# Patient Record
Sex: Male | Born: 1950
Health system: Southern US, Community
[De-identification: ages and names within clinical notes are randomized; demographics above are authoritative.]

## PROBLEM LIST (undated history)

## (undated) DIAGNOSIS — C9110 Chronic lymphocytic leukemia of B-cell type not having achieved remission: Secondary | ICD-10-CM

## (undated) DIAGNOSIS — E7800 Pure hypercholesterolemia, unspecified: Secondary | ICD-10-CM

## (undated) DIAGNOSIS — R251 Tremor, unspecified: Secondary | ICD-10-CM

## (undated) DIAGNOSIS — K635 Polyp of colon: Secondary | ICD-10-CM

## (undated) DIAGNOSIS — K219 Gastro-esophageal reflux disease without esophagitis: Secondary | ICD-10-CM

## (undated) DIAGNOSIS — Z8619 Personal history of other infectious and parasitic diseases: Secondary | ICD-10-CM

## (undated) DIAGNOSIS — R030 Elevated blood-pressure reading, without diagnosis of hypertension: Secondary | ICD-10-CM

## (undated) DIAGNOSIS — Z0000 Encounter for general adult medical examination without abnormal findings: Secondary | ICD-10-CM

## (undated) HISTORY — DX: Gastro-esophageal reflux disease without esophagitis: K21.9

## (undated) HISTORY — DX: Polyp of colon: K63.5

## (undated) HISTORY — DX: Chronic lymphocytic leukemia of B-cell type not having achieved remission: C91.10

## (undated) HISTORY — PX: COLONOSCOPY: SHX174

## (undated) HISTORY — DX: Pure hypercholesterolemia, unspecified: E78.00

## (undated) HISTORY — DX: Personal history of other infectious and parasitic diseases: Z86.19

## (undated) HISTORY — DX: Tremor, unspecified: R25.1

## (undated) HISTORY — PX: POLYPECTOMY: SHX149

## (undated) HISTORY — DX: Encounter for general adult medical examination without abnormal findings: Z00.00

## (undated) HISTORY — DX: Elevated blood-pressure reading, without diagnosis of hypertension: R03.0

---
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
## 2002-09-07 ENCOUNTER — Encounter: Payer: Self-pay | Admitting: Gastroenterology

## 2002-09-07 ENCOUNTER — Ambulatory Visit (HOSPITAL_COMMUNITY): Admission: RE | Admit: 2002-09-07 | Discharge: 2002-09-07 | Payer: Self-pay | Admitting: Gastroenterology

## 2003-03-25 HISTORY — PX: ESOPHAGEAL DILATION: SHX303

## 2003-05-10 ENCOUNTER — Ambulatory Visit (HOSPITAL_COMMUNITY): Admission: RE | Admit: 2003-05-10 | Discharge: 2003-05-10 | Payer: Self-pay | Admitting: Gastroenterology

## 2003-05-10 ENCOUNTER — Encounter (INDEPENDENT_AMBULATORY_CARE_PROVIDER_SITE_OTHER): Payer: Self-pay | Admitting: Specialist

## 2007-03-25 DIAGNOSIS — C9110 Chronic lymphocytic leukemia of B-cell type not having achieved remission: Secondary | ICD-10-CM

## 2007-03-25 HISTORY — DX: Chronic lymphocytic leukemia of B-cell type not having achieved remission: C91.10

## 2008-05-01 ENCOUNTER — Ambulatory Visit: Payer: Self-pay | Admitting: Hematology & Oncology

## 2008-05-05 ENCOUNTER — Encounter: Payer: Self-pay | Admitting: Hematology & Oncology

## 2008-05-05 ENCOUNTER — Other Ambulatory Visit: Admission: RE | Admit: 2008-05-05 | Discharge: 2008-05-05 | Payer: Self-pay | Admitting: Hematology & Oncology

## 2008-05-24 ENCOUNTER — Encounter (INDEPENDENT_AMBULATORY_CARE_PROVIDER_SITE_OTHER): Payer: Self-pay | Admitting: Gastroenterology

## 2008-05-24 ENCOUNTER — Ambulatory Visit (HOSPITAL_COMMUNITY): Admission: RE | Admit: 2008-05-24 | Discharge: 2008-05-24 | Payer: Self-pay | Admitting: Gastroenterology

## 2010-08-06 NOTE — Op Note (Signed)
NAMEQUINTUS, George [name]               ACCOUNT NO.:  [id number]   MEDICAL RECORD NO.:  [id number]          PATIENT TYPE:  AMB   LOCATION:  ENDO                         FACILITY:  [facility]   PHYSICIAN:  Petra Kuba, M.D.    DATE OF BIRTH:  06/21/1950   DATE OF PROCEDURE:  05/24/2008  DATE OF DISCHARGE:                               OPERATIVE REPORT   PROCEDURE:  Colonoscopy with polypectomy.   INDICATION:  History of colon polyp due for repeat screening.  Consent  was signed after risks, benefits, methods, and options were thoroughly  discussed multiple times in the past.   MEDICINES USED:  1. Fentanyl 100 mcg.  2. Versed 10 mg.   PROCEDURE:  Rectal inspection is pertinent for external hemorrhoids,  small.  Digital exam was negative.  The pediatric video colonoscope was  inserted, easily advanced around the colon to the cecum.  This did not  require any abdominal pressure or any position changes.  Other than some  sigmoid diverticula, no abnormalities were seen on insertion.  The cecum  was identified by the appendiceal orifice in the ileocecal valve.  The  scope was slowly withdrawn.  The prep was adequate.  There was some  liquid stool that required washing and suctioning, and slow withdrawal  through the colon.  In the cecal pole, a tiny polyp was seen and was  cold biopsied x3 and put in the first container.  The scope was further  withdrawn.  In the more proximal transverse, a small semi-sessile polyp  was seen, snare electrocautery applied.  The polyp was suctioned through  the scope and collected in the trap.  The scope was further withdrawn.  Other than the sigmoid diverticula, no other polyp or lesions were seen  as we slowly withdrew back into the rectum.  Anorectal pull-through and  retroflexion confirmed some small hemorrhoids.  The scope was drained  and readvanced slowly towards the left side of the colon.  Air was  suctioned and scope removed.  The patient tolerated the  procedure well.  There was no obvious immediate complication.   ENDOSCOPIC DIAGNOSES:  1. Internal and external small hemorrhoids.  2. Sigmoid diverticula.  3. Small transverse proximal polyp snare.  4. Tiny cecal polyp cold biopsy.  5. Otherwise within normal limits cecum.   PLAN:  Await pathology.  Probably recheck colon screening in 5 years.  Happy to see back p.r.n. particularly if upper tract symptoms worsen and  otherwise return care to Dr. Azucena Cecil for the customary healthcare  screening and maintenance.           ______________________________  Petra Kuba, M.D.     MEM/MEDQ  D:  05/24/2008  T:  05/24/2008  Job:  [id number]   cc:   Tally Joe, M.D.

## 2010-08-09 NOTE — Op Note (Signed)
NAMEGIANCARLOS, George [name]                           ACCOUNT NO.:  [id number]   MEDICAL RECORD NO.:  [id number]                   PATIENT TYPE:  AMB   LOCATION:  ENDO                                 FACILITY:  [facility]   PHYSICIAN:  Petra Kuba, M.D.                 DATE OF BIRTH:  March 15, 1951   DATE OF PROCEDURE:  05/10/2003  DATE OF DISCHARGE:                                 OPERATIVE REPORT   PROCEDURE:  Colonoscopy.   INDICATIONS FOR PROCEDURE:  Screening.  Consent was signed after risks,  benefits, methods, and options were thoroughly discussed in the office.   MEDICATIONS USED:  Demerol 100, Versed 8.   PROCEDURE:  Rectal inspection was pertinent for external hemorrhoids.  Digital exam was negative.  The video pediatric adjustable colonoscope was  inserted and easily advanced through the colon to the cecum.  It did require  some abdominal pressure and position changes.  On insertion, some left sided  diverticula was seen but no other abnormalities.  The cecum was identified  by the appendiceal orifice and the ileocecal valve.  The scope was slowly  withdrawn.  The prep was adequate, there was some liquid stool that required  washing and suctioning.  On slow withdrawal through the colon, the cecum,  ascending, and transverse was normal.  The scope was withdrawn around the  left side of the colon, scattered diverticula were seen.  There were was a  larger increase in the sigmoid.  The scope was withdrawn back to the rectum  where two tiny, one proximal and one distal, polyps were seen and were each  cold biopsied x 2, put in the same container.  No other abnormalities were  seen.  Anorectal pull through and retroflexion confirms hemorrhoids.  The  scope was straightened and readvanced a short ways up the left side of the  colon, air was suctioned, the scope was removed.  The patient tolerated the  procedure well.  There was no obvious immediate complications.   ENDOSCOPIC DIAGNOSIS:  1. Internal and external hemorrhoids.  2. Left sided diverticula.  3. Two rectal polyps, cold biopsied.  4. Otherwise, within normal limits to the cecum.   PLAN:  Await pathology.  Probably recheck colon screening in five years.  Happy to see back p.r.n. or in six months to recheck his swallowing.  Otherwise, return care to Dr. Dayton Scrape for the customary health care  maintenance to include yearly rectals and guaiacs.                                               Petra Kuba, M.D.    MEM/MEDQ  D:  05/10/2003  T:  05/10/2003  Job:  [id number]   cc:   Meredith Staggers, M.D.  [phone]  [address], [address]  [place]  [place] [address]  Fax: [phone]

## 2010-08-09 NOTE — Op Note (Signed)
George, [name]                           ACCOUNT NO.:  [id number]   MEDICAL RECORD NO.:  [id number]                   PATIENT TYPE:  AMB   LOCATION:  ENDO                                 FACILITY:  [facility]   PHYSICIAN:  Petra Kuba, M.D.                 DATE OF BIRTH:  [birth year]   DATE OF PROCEDURE:  09/07/2002  DATE OF DISCHARGE:                                 OPERATIVE REPORT   PROCEDURE:  Esophagogastroduodenoscopy with Savary dilatation.   INDICATIONS:  Longstanding dysphagia in a patient with a history of reflux  prior to this.  Consent was signed after risks, benefits, methods, options  were thoroughly discussed in the past.   MEDICATIONS:  Demerol 100, Versed 10.   DESCRIPTION OF PROCEDURE:  The video endoscope was inserted by direct  vision.  The proximal and mid esophagus was normal.  In the distal esophagus  was a tight, calcified ring-like stricture without any obvious mass lesion.  We could not advance the scope with general pressure.  The Savary wire was  advanced into the stomach under endoscopic and fluoroscopic guidance and the  customary J-loop seen fluoroscopically in the stomach.  The scope was  withdrawn.  No obvious proximal esophageal abnormality was seen.  We  withdrew the scope, making sure to keep the wire in the proper position  under fluoroscopy.  We then proceeded with the Savary dilatation in the  customary fashion using the 9, 11 and then 12.8 mm dilators.  There was no  resistance in passing any of the dilators and no heme.  Once the 12.8 was  confirmed in the stomach, the wire was withdrawn back into the dilator.  Both were removed again. We went ahead and reinserted the endoscope.  Again  the proximal and mid esophagus was normal and in the distal esophagus, the  ring was obviously fractured with some fresh heme but no signs of active  bleeding.  Scope passed easily into the stomach, advanced through a normal  antrum and normal pylorus,  into a normal duodenal bulb and around the C-loop  to the a normal second portion of the duodenum.  The scope was withdrawn  back to the stomach and retroflexed.  Hiatal hernia was confirmed in the  cardia.  Fundus, annularis, lesser and greater curve were normal retroflexed  visualization.  Straight visualization of the stomach was normal.  Again the  scope was slowly withdrawn.  We elected, based on the fracturing of the  ring, not to be any further aggressive at this time.  The scope was removed.  The patient tolerated the procedure well.  There was no obvious  complication.   ENDOSCOPIC DIAGNOSES:  1. Small hiatal hernia with a tight fibrous, ring-like stricture.  Unable to     pass the scope.  2. Savary dilatation under fluoroscopy to 12.8 mm without resistance or  heme.  3. We passed the scope with good fracturing of the ring. No active bleeding     at the end of the procedure.  Otherwise negative     esophagogastroduodenoscopy.   PLAN:  Based on how tight the stricture was, will use Nexium to prevent  recurrence.  Will be happy to see back p.r.n. and redilate more  aggressively.  Otherwise follow up in two months to recheck symptoms and  make sure no further workup plans are needed.                                                Petra Kuba, M.D.   MEM/MEDQ  D:  09/07/2002  T:  09/07/2002  Job:  [id number]

## 2010-10-22 ENCOUNTER — Encounter: Payer: Self-pay | Admitting: Podiatry

## 2010-10-22 DIAGNOSIS — K219 Gastro-esophageal reflux disease without esophagitis: Secondary | ICD-10-CM

## 2010-10-22 DIAGNOSIS — E7800 Pure hypercholesterolemia, unspecified: Secondary | ICD-10-CM

## 2010-10-22 DIAGNOSIS — M722 Plantar fascial fibromatosis: Secondary | ICD-10-CM | POA: Insufficient documentation

## 2010-12-25 ENCOUNTER — Other Ambulatory Visit: Payer: Self-pay | Admitting: Hematology & Oncology

## 2010-12-25 ENCOUNTER — Encounter (HOSPITAL_BASED_OUTPATIENT_CLINIC_OR_DEPARTMENT_OTHER): Payer: 59 | Admitting: Hematology & Oncology

## 2010-12-25 DIAGNOSIS — C9110 Chronic lymphocytic leukemia of B-cell type not having achieved remission: Secondary | ICD-10-CM

## 2010-12-25 DIAGNOSIS — D72820 Lymphocytosis (symptomatic): Secondary | ICD-10-CM

## 2010-12-25 LAB — CBC WITH DIFFERENTIAL (CANCER CENTER ONLY)
BASO#: 0.1 10*3/uL (ref 0.0–0.2)
BASO%: 0.3 % (ref 0.0–2.0)
EOS%: 0.5 % (ref 0.0–7.0)
MCH: 31.8 pg (ref 28.0–33.4)
MCHC: 35.2 g/dL (ref 32.0–35.9)
MONO%: 3.2 % (ref 0.0–13.0)
NEUT#: 4.8 10*3/uL (ref 1.5–6.5)
Platelets: 206 10*3/uL (ref 145–400)
RDW: 13 % (ref 11.1–15.7)

## 2010-12-25 LAB — CHCC SATELLITE - SMEAR

## 2010-12-25 LAB — TECHNOLOGIST REVIEW CHCC SATELLITE

## 2010-12-27 LAB — VITAMIN D 25 HYDROXY (VIT D DEFICIENCY, FRACTURES): Vit D, 25-Hydroxy: 54 ng/mL (ref 30–89)

## 2010-12-27 LAB — BETA 2 MICROGLOBULIN, SERUM: Beta-2 Microglobulin: 1.01 mg/L (ref 1.01–1.73)

## 2010-12-27 LAB — IGG, IGA, IGM
IgA: 191 mg/dL (ref 68–379)
IgM, Serum: 132 mg/dL (ref 41–251)

## 2010-12-27 LAB — PROTEIN ELECTROPHORESIS, SERUM
Alpha-1-Globulin: 3.6 % (ref 2.9–4.9)
Beta 2: 4.8 % (ref 3.2–6.5)
Gamma Globulin: 12 % (ref 11.1–18.8)

## 2011-06-11 ENCOUNTER — Ambulatory Visit: Payer: 59 | Admitting: Hematology & Oncology

## 2011-06-11 ENCOUNTER — Other Ambulatory Visit (HOSPITAL_BASED_OUTPATIENT_CLINIC_OR_DEPARTMENT_OTHER): Payer: 59 | Admitting: Lab

## 2011-06-11 ENCOUNTER — Ambulatory Visit (HOSPITAL_BASED_OUTPATIENT_CLINIC_OR_DEPARTMENT_OTHER): Payer: 59 | Admitting: Hematology & Oncology

## 2011-06-11 VITALS — BP 145/94 | HR 62 | Temp 97.7°F | Ht 68.5 in | Wt 164.0 lb

## 2011-06-11 DIAGNOSIS — C9110 Chronic lymphocytic leukemia of B-cell type not having achieved remission: Secondary | ICD-10-CM

## 2011-06-11 DIAGNOSIS — K219 Gastro-esophageal reflux disease without esophagitis: Secondary | ICD-10-CM

## 2011-06-11 LAB — MANUAL DIFFERENTIAL (CHCC SATELLITE)
ANC (CHCC HP manual diff): 5.5 10*3/uL (ref 1.5–6.5)
LYMPH: 80 % — ABNORMAL HIGH (ref 14–48)
PLT EST ~~LOC~~: ADEQUATE
Platelet Morphology: NORMAL

## 2011-06-11 LAB — CBC WITH DIFFERENTIAL (CANCER CENTER ONLY)
HCT: 42.4 % (ref 38.7–49.9)
HGB: 14.5 g/dL (ref 13.0–17.1)
MCH: 31.2 pg (ref 28.0–33.4)
MCHC: 34.2 g/dL (ref 32.0–35.9)
RDW: 12.7 % (ref 11.1–15.7)

## 2011-06-11 LAB — CHCC SATELLITE - SMEAR

## 2011-06-11 MED ORDER — ESOMEPRAZOLE MAGNESIUM 40 MG PO CPDR: 40.0000 mg | DELAYED_RELEASE_CAPSULE | Freq: Every day | ORAL | Status: DC

## 2011-06-11 NOTE — Progress Notes (Signed)
CC:   Marguarite Arbour, MD  DIAGNOSIS:  Stage A chronic lymphocytic leukemia.  CURRENT THERAPY:  Observation.  INTERIM HISTORY:  George [name] comes in for followup.  We have seen him since February 2010.  He has done very well.  We last saw him back in October 2012.  He has had no problems at all since then.  He has had no fevers, chills, or sweats.  He has had no palpable lymph glands.  He has had no change in bowel or bladder habits.  There has been no weight loss or weight gain.  He has not noticed any rashes.  He has noted a couple of small lesions on his face.  He is going to be seen by a dermatologist for this.  He is on Nexium and Crestor.  These are being managed by primary care.  When I saw him in October, his IgG level was 846 mg/dL.  He did not have a monoclonal spike in his serum.  PHYSICAL EXAM:  General: This is a well-developed, well-nourished, white gentleman in no obvious distress.  Vital signs: Show temperature of 97.7, pulse 62, respiratory rate 14, blood pressure 145/94, and weight is 164.  Head and neck exam shows a normocephalic, atraumatic skull. There are no ocular or oral lesions.  He has no adenopathy in his neck. Thyroid is nonpalpable.  Lungs are clear bilaterally.  Cardiac exam: Regular rate and rhythm with a normal S1 and S2.  There are no murmurs, rubs, or bruits.  Abdominal exam: Soft with good bowel sounds.  There is no palpable abdominal mass.  There is no fluid wave.  No palpable hepatosplenomegaly is noted.  Back exam:  No tenderness over the spine, ribs, or hips.  Extremities: Shows no clubbing, cyanosis, or edema. Neurological exam: Shows no focal neurological deficits.  Axillary exam: Shows no bilateral axillary adenopathy.  LABORATORY STUDIES:  White cell count 32.4, hemoglobin is 14.5, hematocrit 43.4, and platelet count 208.  White cell differential shows 17 segs, 80 lymphocytes.  IMPRESSION:  George [name] is a 61 year old gentleman with stage  A chronic lymphocytic leukemia.  We have been following him now for 3 years.  His white count is going up slowly.  He is not anemic or thrombocytopenic.  We will go ahead and plan to get him back in another 4 months for followup.  I do not see need for any additional testing right now.  Again, we can just follow his white cell count.   ______________________________ Josph Macho, M.D. PRE/MEDQ  D:  06/11/2011  T:  06/11/2011  Job:  [id number]

## 2011-06-11 NOTE — Progress Notes (Signed)
This office note has been dictated.

## 2011-06-13 ENCOUNTER — Telehealth: Payer: Self-pay | Admitting: Internal Medicine

## 2011-06-13 ENCOUNTER — Ambulatory Visit (INDEPENDENT_AMBULATORY_CARE_PROVIDER_SITE_OTHER): Payer: 59 | Admitting: Internal Medicine

## 2011-06-13 ENCOUNTER — Encounter: Payer: Self-pay | Admitting: Internal Medicine

## 2011-06-13 VITALS — BP 124/74 | HR 78 | Temp 97.9°F | Resp 18 | Ht 67.0 in | Wt 161.0 lb

## 2011-06-13 DIAGNOSIS — Z79899 Other long term (current) drug therapy: Secondary | ICD-10-CM

## 2011-06-13 DIAGNOSIS — Z125 Encounter for screening for malignant neoplasm of prostate: Secondary | ICD-10-CM

## 2011-06-13 DIAGNOSIS — E7800 Pure hypercholesterolemia, unspecified: Secondary | ICD-10-CM

## 2011-06-13 DIAGNOSIS — K219 Gastro-esophageal reflux disease without esophagitis: Secondary | ICD-10-CM

## 2011-06-13 LAB — PROTEIN ELECTROPHORESIS, SERUM
Albumin ELP: 63.2 % (ref 55.8–66.1)
Alpha-1-Globulin: 4 % (ref 2.9–4.9)
Beta 2: 4.7 % (ref 3.2–6.5)

## 2011-06-13 LAB — LIPID PANEL
LDL Cholesterol: 131 mg/dL — ABNORMAL HIGH (ref 0–99)
Triglycerides: 71 mg/dL (ref <150)
VLDL: 14 mg/dL (ref 0–40)

## 2011-06-13 LAB — IGG, IGA, IGM
IgA: 221 mg/dL (ref 68–379)
IgG (Immunoglobin G), Serum: 910 mg/dL (ref 650–1600)
IgM, Serum: 117 mg/dL (ref 41–251)

## 2011-06-13 LAB — LACTATE DEHYDROGENASE: LDH: 85 U/L — ABNORMAL LOW (ref 94–250)

## 2011-06-13 MED ORDER — ROSUVASTATIN CALCIUM 40 MG PO TABS: 40.0000 mg | ORAL_TABLET | Freq: Every day | ORAL | Status: DC

## 2011-06-13 MED ORDER — ESOMEPRAZOLE MAGNESIUM 40 MG PO CPDR: 40.0000 mg | DELAYED_RELEASE_CAPSULE | Freq: Every day | ORAL | Status: DC

## 2011-06-13 NOTE — Patient Instructions (Signed)
Please schedule lipid/lft 272.4, psa-prostate cancer screening prior to next visit

## 2011-06-13 NOTE — Telephone Encounter (Signed)
Please schedule lipid/lft 272.4, psa-prostate cancer screening prior to next visit  Patient states that he will be going to Dr. Gustavo Lah office to draw labs???

## 2011-06-13 NOTE — Progress Notes (Signed)
  Subjective:    Patient ID: George [name], male    DOB: [birth year], 61 y.o.   MRN: [id number]  HPI Pt presents to clinic for follow up of multiple medical problems. Known h/o CLL followed by H/O and asx. Taking crestor 20mg /40mg  every other day. Trying to avoid 40mg  qd. Denies myalgias or abn lft. Exercises daily using bike. No active complaint.   Past Medical History  Diagnosis Date  . High cholesterol   . GERD (gastroesophageal reflux disease)    Past Surgical History  Procedure Date  . Hernia repair     reports that he has quit smoking. He has never used smokeless tobacco. He reports that he drinks alcohol. His drug history not on file. family history is not on file. No Known Allergies   Review of Systems  Constitutional: Negative for fatigue.  Respiratory: Negative for shortness of breath.   Cardiovascular: Negative for chest pain.  Gastrointestinal: Negative for abdominal pain.  All other systems reviewed and are negative.       Objective:   Physical Exam  Physical Exam  Nursing note and vitals reviewed. Constitutional: Appears well-developed and well-nourished. No distress.  HENT:  Head: Normocephalic and atraumatic.  Right Ear: External ear normal.  Left Ear: External ear normal.  Eyes: Conjunctivae are normal. No scleral icterus.  Neck: Neck supple. Carotid bruit is not present.  Cardiovascular: Normal rate, regular rhythm and normal heart sounds.  Exam reveals no gallop and no friction rub.   No murmur heard. Pulmonary/Chest: Effort normal and breath sounds normal. No respiratory distress. He has no wheezes. no rales.  Lymphadenopathy:    He has no cervical adenopathy.  Neurological:Alert.  Skin: Skin is warm and dry. Not diaphoretic.  Psychiatric: Has a normal mood and affect.        Assessment & Plan:

## 2011-06-13 NOTE — Telephone Encounter (Signed)
Labs entered for July 2013.

## 2011-06-14 LAB — HEPATIC FUNCTION PANEL
ALT: 20 U/L (ref 0–53)
Albumin: 4.6 g/dL (ref 3.5–5.2)
Indirect Bilirubin: 0.4 mg/dL (ref 0.0–0.9)
Total Protein: 7.1 g/dL (ref 6.0–8.3)

## 2011-06-14 LAB — BASIC METABOLIC PANEL
BUN: 13 mg/dL (ref 6–23)
CO2: 24 mEq/L (ref 19–32)
Calcium: 9.9 mg/dL (ref 8.4–10.5)
Chloride: 102 mEq/L (ref 96–112)
Creat: 0.83 mg/dL (ref 0.50–1.35)
Glucose, Bld: 85 mg/dL (ref 70–99)

## 2011-06-15 NOTE — Assessment & Plan Note (Signed)
Attempt crestor 20mg  po qd. Obtain lft today and lipid prior to next visit.

## 2011-06-15 NOTE — Assessment & Plan Note (Signed)
Stable. rf  nexium

## 2011-06-23 ENCOUNTER — Telehealth: Payer: Self-pay | Admitting: *Deleted

## 2011-06-23 NOTE — Telephone Encounter (Addendum)
Message copied by Mirian Capuchin on Mon Jun 23, 2011  3:57 PM ------      Message from: Arlan Organ R      Created: Mon Jun 16, 2011  7:03 PM       Call and tell him that his lab work looks okay although his cholesterol is high. Make sure his primary doctor get a copy of his cholesterol report thanks.  This message left on pt's home answering machine.

## 2011-07-09 ENCOUNTER — Telehealth: Payer: Self-pay | Admitting: Internal Medicine

## 2011-07-09 NOTE — Telephone Encounter (Signed)
Received medical records from [facility]-Dr. Tally Joe

## 2011-07-16 ENCOUNTER — Encounter: Payer: Self-pay | Admitting: Internal Medicine

## 2011-07-16 ENCOUNTER — Ambulatory Visit (INDEPENDENT_AMBULATORY_CARE_PROVIDER_SITE_OTHER): Payer: 59 | Admitting: Internal Medicine

## 2011-07-16 VITALS — BP 112/80 | HR 73 | Temp 98.3°F | Ht 67.0 in | Wt 159.0 lb

## 2011-07-16 DIAGNOSIS — J069 Acute upper respiratory infection, unspecified: Secondary | ICD-10-CM

## 2011-07-16 MED ORDER — DOXYCYCLINE HYCLATE 100 MG PO TABS: 100.0000 mg | ORAL_TABLET | Freq: Two times a day (BID) | ORAL | Status: AC

## 2011-07-17 DIAGNOSIS — J069 Acute upper respiratory infection, unspecified: Secondary | ICD-10-CM | POA: Insufficient documentation

## 2011-07-17 NOTE — Progress Notes (Signed)
  Subjective:    Patient ID: George [name], male    DOB: [birth year], 61 y.o.   MRN: [id number]  HPI Pt presents to clinic for evaluation of cough. Notes ~4d h/o cough productive for green/yellow sputum without hemoptysis. +nasal drainage and congestion. No f/c. Taking otc sudafed. No other alleviating or exacerbating factors. No other complaints.  Past Medical History  Diagnosis Date  . High cholesterol   . GERD (gastroesophageal reflux disease)   . History of chicken pox     childhood  . Colon polyp   . CLL (chronic lymphocytic leukemia)    Past Surgical History  Procedure Date  . Hernia repair   . Esophageal dilation 2005    reports that he has quit smoking. He has never used smokeless tobacco. He reports that he drinks alcohol. His drug history not on file. family history includes Diabetes in an unspecified family member; Hyperlipidemia in his mother; Hypertension in his mother; and Pulmonary fibrosis in his mother.  There is no history of Prostate cancer, and Breast cancer, and Colon cancer, and Heart disease, . No Known Allergies   Review of Systems see hpi     Objective:   Physical Exam  Nursing note and vitals reviewed. Constitutional: He appears well-developed and well-nourished. No distress.  HENT:  Head: Normocephalic and atraumatic.  Right Ear: External ear normal.  Left Ear: External ear normal.  Nose: Nose normal.  Mouth/Throat: Oropharynx is clear and moist. No oropharyngeal exudate.  Eyes: Conjunctivae are normal. Right eye exhibits no discharge. Left eye exhibits no discharge. No scleral icterus.  Neck: Neck supple.  Pulmonary/Chest: Effort normal and breath sounds normal. No respiratory distress. He has no wheezes. He has no rales.  Lymphadenopathy:    He has no cervical adenopathy.  Neurological: He is alert.  Skin: He is not diaphoretic.  Psychiatric: He has a normal mood and affect.          Assessment & Plan:

## 2011-07-17 NOTE — Assessment & Plan Note (Signed)
Given abx to hold. Begin abx if sx's do not improved after total duration of 8-10 days. Followup if no improvement or worsening.

## 2011-10-01 ENCOUNTER — Ambulatory Visit (HOSPITAL_BASED_OUTPATIENT_CLINIC_OR_DEPARTMENT_OTHER): Payer: 59 | Admitting: Hematology & Oncology

## 2011-10-01 ENCOUNTER — Other Ambulatory Visit (HOSPITAL_BASED_OUTPATIENT_CLINIC_OR_DEPARTMENT_OTHER): Payer: 59 | Admitting: Lab

## 2011-10-01 ENCOUNTER — Other Ambulatory Visit: Payer: Self-pay | Admitting: Internal Medicine

## 2011-10-01 VITALS — BP 133/90 | HR 58 | Temp 97.1°F | Ht 67.0 in | Wt 157.0 lb

## 2011-10-01 DIAGNOSIS — C9110 Chronic lymphocytic leukemia of B-cell type not having achieved remission: Secondary | ICD-10-CM

## 2011-10-01 LAB — CBC WITH DIFFERENTIAL (CANCER CENTER ONLY)
BASO%: 0.2 % (ref 0.0–2.0)
Eosinophils Absolute: 0.1 10*3/uL (ref 0.0–0.5)
HCT: 42.8 % (ref 38.7–49.9)
HGB: 14.6 g/dL (ref 13.0–17.1)
LYMPH#: 27.4 10*3/uL — ABNORMAL HIGH (ref 0.9–3.3)
MCV: 92 fL (ref 82–98)
MONO#: 0.7 10*3/uL (ref 0.1–0.9)
NEUT%: 12 % — ABNORMAL LOW (ref 40.0–80.0)
RBC: 4.68 10*6/uL (ref 4.20–5.70)
RDW: 13.2 % (ref 11.1–15.7)
WBC: 32 10*3/uL — ABNORMAL HIGH (ref 4.0–10.0)

## 2011-10-01 LAB — LIPID PANEL
HDL: 74 mg/dL (ref >39)
LDL Cholesterol: 123 mg/dL — ABNORMAL HIGH (ref 0–99)

## 2011-10-01 LAB — CHCC SATELLITE - SMEAR

## 2011-10-01 NOTE — Progress Notes (Signed)
This office note has been dictated.

## 2011-10-02 LAB — HEPATIC FUNCTION PANEL
AST: 31 U/L (ref 0–37)
AST: 28 U/L (ref 0–37)
Alkaline Phosphatase: 54 U/L (ref 39–117)
Bilirubin, Direct: 0.2 mg/dL (ref 0.0–0.3)
Indirect Bilirubin: 0.6 mg/dL (ref 0.0–0.9)
Indirect Bilirubin: 0.6 mg/dL (ref 0.0–0.9)
Total Bilirubin: 0.8 mg/dL (ref 0.3–1.2)
Total Bilirubin: 0.8 mg/dL (ref 0.3–1.2)

## 2011-10-02 NOTE — Progress Notes (Signed)
CURRENT THERAPY:  Observation.  INTERIM HISTORY:  George [name] comes in for followup.  We see him every 4- 6 months.  Since we last saw him in March, he has had no problems.  He has had no fevers, sweats or chills.  He has had no palpable lymph nodes.  He has had no fatigue or weakness.  He is still working without any difficulties.  He has not noticed any weight gain or weight loss. He has had no cough or shortness breath.  There has been no rashes.  He has had no headache.  PHYSICAL EXAMINATION:  General: This is a well-developed, well-nourished white gentleman in no obvious distress.  Vital signs:  Temperature of 97.1, pulse 58, respiratory rate 20, blood pressure 133/90.  Weight is 157.  Head and neck:  Normocephalic, atraumatic skull.  There are no ocular or oral lesions.  There are no palpable cervical or supraclavicular lymph nodes.  Lungs:  Clear bilaterally.  Cardiac: Regular rate and rhythm with a normal S1 and S2.  There are no murmurs, rubs, or bruits.  Axillary exam shows no bilateral axillary adenopathy. Abdomen:  Soft with good bowel sounds.  There is no palpable abdominal mass.  There is no fluid wave.  There is no palpable hepatosplenomegaly. Back: No tenderness over the spine, ribs, or hips.  Extremities:  Shows no clubbing, cyanosis or edema.  Neurological:  Shows no focal neurological deficits.  Skin:  No rashes, ecchymosis or petechia.  LABORATORY STUDIES:  White cell count is 232, hemoglobin 14.6, hematocrit 42.8, platelet count 199.  White cell differential shows 12% segs, 85% lymphocytes.  IMPRESSION:  George [name] is a 61 year old gentleman with stage A chronic lymphocytic leukemia.  We have been seeing him now for 3-1/2 half years. He has done very nicely.  So far, there has been no evidence that his CLL is progressing.  I do not see any indication for therapy.  We will plan to get him back in another 4 months or so.  I do not see that we need any blood work  in between visits.    ______________________________ George [name], M.D. PRE/MEDQ  D:  10/01/2011  T:  10/02/2011  Job:  [id number]

## 2011-10-03 LAB — PSA: PSA: 0.53 ng/mL (ref <=4.00)

## 2011-10-14 ENCOUNTER — Ambulatory Visit (INDEPENDENT_AMBULATORY_CARE_PROVIDER_SITE_OTHER): Payer: 59 | Admitting: Internal Medicine

## 2011-10-14 ENCOUNTER — Encounter: Payer: Self-pay | Admitting: Internal Medicine

## 2011-10-14 VITALS — BP 116/70 | HR 54 | Temp 98.1°F | Resp 14 | Wt 158.8 lb

## 2011-10-14 DIAGNOSIS — C9110 Chronic lymphocytic leukemia of B-cell type not having achieved remission: Secondary | ICD-10-CM | POA: Insufficient documentation

## 2011-10-14 DIAGNOSIS — E7800 Pure hypercholesterolemia, unspecified: Secondary | ICD-10-CM

## 2011-10-14 NOTE — Assessment & Plan Note (Signed)
Improving control. Continue statin tx. Low fat diet and exercise recommended. Obtain lipid/lft prior to next visit

## 2011-10-14 NOTE — Patient Instructions (Signed)
Please schedule fasting labs prior to next visit Lipid/lft-272.4 and chem7-v58.69 

## 2011-10-14 NOTE — Progress Notes (Signed)
  Subjective:    Patient ID: George [name], male    DOB: [birth year], 61 y.o.   MRN: [id number]  HPI Pt presents to clinic for followup of multiple medical problems. Tolerating statin tx. Reviewed tcho and ldl improving. H/o CLL with stable WBC followed by H/O with utd follow up. No active complaints.  Past Medical History  Diagnosis Date  . High cholesterol   . GERD (gastroesophageal reflux disease)   . History of chicken pox     childhood  . Colon polyp   . CLL (chronic lymphocytic leukemia)    Past Surgical History  Procedure Date  . Hernia repair   . Esophageal dilation 2005    reports that he has quit smoking. He has never used smokeless tobacco. He reports that he drinks alcohol. His drug history not on file. family history includes Diabetes in an unspecified family member; Hyperlipidemia in his mother; Hypertension in his mother; and Pulmonary fibrosis in his mother.  There is no history of Prostate cancer, and Breast cancer, and Colon cancer, and Heart disease, . No Known Allergies    Review of Systems see hpi     Objective:   Physical Exam  Physical Exam  Nursing note and vitals reviewed. Constitutional: Appears well-developed and well-nourished. No distress.  HENT:  Head: Normocephalic and atraumatic.  Right Ear: External ear normal.  Left Ear: External ear normal.  Eyes: Conjunctivae are normal. No scleral icterus.  Neck: Neck supple. Carotid bruit is not present.  Cardiovascular: Normal rate, regular rhythm and normal heart sounds.  Exam reveals no gallop and no friction rub.   No murmur heard. Pulmonary/Chest: Effort normal and breath sounds normal. No respiratory distress. He has no wheezes. no rales.  Lymphadenopathy:    He has no cervical adenopathy.  Neurological:Alert.  Skin: Skin is warm and dry. Not diaphoretic.  Psychiatric: Has a normal mood and affect.        Assessment & Plan:

## 2012-02-02 ENCOUNTER — Other Ambulatory Visit: Payer: 59 | Admitting: Lab

## 2012-02-02 ENCOUNTER — Ambulatory Visit: Payer: 59 | Admitting: Hematology & Oncology

## 2012-02-04 ENCOUNTER — Ambulatory Visit (HOSPITAL_BASED_OUTPATIENT_CLINIC_OR_DEPARTMENT_OTHER): Payer: 59 | Admitting: Hematology & Oncology

## 2012-02-04 ENCOUNTER — Other Ambulatory Visit (HOSPITAL_BASED_OUTPATIENT_CLINIC_OR_DEPARTMENT_OTHER): Payer: 59 | Admitting: Lab

## 2012-02-04 VITALS — BP 156/90 | HR 63 | Temp 98.2°F | Resp 16 | Ht 67.0 in | Wt 162.0 lb

## 2012-02-04 DIAGNOSIS — C9110 Chronic lymphocytic leukemia of B-cell type not having achieved remission: Secondary | ICD-10-CM

## 2012-02-04 LAB — CBC WITH DIFFERENTIAL (CANCER CENTER ONLY)
HCT: 42.6 % (ref 38.7–49.9)
HGB: 14.5 g/dL (ref 13.0–17.1)
MCV: 91 fL (ref 82–98)
WBC: 40.8 10*3/uL — ABNORMAL HIGH (ref 4.0–10.0)

## 2012-02-04 LAB — MANUAL DIFFERENTIAL (CHCC SATELLITE)
ALC: 31.8 10*3/uL — ABNORMAL HIGH (ref 0.9–3.3)
ANC (CHCC HP manual diff): 7.3 10*3/uL — ABNORMAL HIGH (ref 1.5–6.5)
Eos: 1 % (ref 0–7)
LYMPH: 78 % — ABNORMAL HIGH (ref 14–48)
MONO: 3 % (ref 0–13)

## 2012-02-04 NOTE — Progress Notes (Signed)
This office note has been dictated.

## 2012-02-05 NOTE — Progress Notes (Signed)
CC:   Marguarite Arbour, MD  DIAGNOSIS:  Chronic lymphocytic leukemia.  Current therapy Observation.  INTERIM HISTORY:  George [name] comes in for his followup.  He had a good time this summer.  He and his [place] went out west.  They did a lot of hiking.  He is still staying very active.  He has not noticed any problems with fatigue.  There have been no sweats or fevers.  He has had no cough.  He has had no change in bowel or bladder habits.  He has not noticed any swollen lymph glands.  PHYSICAL EXAMINATION:  General:  This is a well-developed, well- nourished white gentleman in no obvious distress.  Vital signs: Temperature of 98.2, pulse 63, respiratory rate 16, blood pressure 156/90.  Weight is 162.  Head and neck:  Normocephalic, atraumatic skull.  There are no ocular or oral lesions.  There are no palpable cervical or supraclavicular lymph nodes.  Lungs:  Clear bilaterally. Cardiac:  Regular rate and rhythm with a normal S1 and S2.  There are no murmurs, rubs, or bruits.  Abdomen:  Soft with good bowel sounds.  There is no palpable abdominal mass.  There is no palpable hepatosplenomegaly. Axillary:  A 1-cm right axillary lymph node.  There is no left axillary adenopathy.  Inguinal:  No inguinal adenopathy bilaterally. Extremities:  No clubbing, cyanosis, or edema.  Skin:  No rashes, ecchymosis or petechia.  Neurologic:  No focal neurological deficits.  LABORATORY STUDIES:  White cell count is 40.8, hemoglobin 14.5, hematocrit 42.6, platelet count 190.  White cell differential shows 18 segs, 78 lymphs.  IMPRESSION:  George [name] is a 61 year old gentleman with chronic lymphocytic leukemia.  He has stage A chronic lymphocytic leukemia.  We have been following him now for, I think, close to 4 years.  I want to keep track on the white cell count.  I want to see him back in 4 more months.  Hopefully, we will not see a continued rise in his white  cells.    ______________________________ Josph Macho, M.D. PRE/MEDQ  D:  02/04/2012  T:  02/05/2012  Job:  [id number]

## 2012-04-12 ENCOUNTER — Ambulatory Visit (INDEPENDENT_AMBULATORY_CARE_PROVIDER_SITE_OTHER): Payer: 59 | Admitting: Family Medicine

## 2012-04-12 ENCOUNTER — Ambulatory Visit: Payer: 59 | Admitting: Internal Medicine

## 2012-04-12 ENCOUNTER — Encounter: Payer: Self-pay | Admitting: Family Medicine

## 2012-04-12 ENCOUNTER — Telehealth: Payer: Self-pay | Admitting: Internal Medicine

## 2012-04-12 VITALS — BP 138/92 | HR 74 | Temp 97.9°F | Ht 67.0 in | Wt 164.0 lb

## 2012-04-12 DIAGNOSIS — K219 Gastro-esophageal reflux disease without esophagitis: Secondary | ICD-10-CM

## 2012-04-12 DIAGNOSIS — E7800 Pure hypercholesterolemia, unspecified: Secondary | ICD-10-CM

## 2012-04-12 DIAGNOSIS — C9110 Chronic lymphocytic leukemia of B-cell type not having achieved remission: Secondary | ICD-10-CM

## 2012-04-12 MED ORDER — SIMVASTATIN 40 MG PO TABS: 40.0000 mg | ORAL_TABLET | Freq: Every evening | ORAL | Status: DC

## 2012-04-12 NOTE — Patient Instructions (Signed)
  Consider switching to MegaRed krill oil caps daily by Schiff, avoid trans fats  Cholesterol Cholesterol is a white, waxy, fat-like protein needed by your body in small amounts. The liver makes all the cholesterol you need. It is carried from the liver by the blood through the blood vessels. Deposits (plaque) may build up on blood vessel walls. This makes the arteries narrower and stiffer. Plaque increases the risk for heart attack and stroke. You cannot feel your cholesterol level even if it is very high. The only way to know is by a blood test to check your lipid (fats) levels. Once you know your cholesterol levels, you should keep a record of the test results. Work with your caregiver to to keep your levels in the desired range. WHAT THE RESULTS MEAN:  Total cholesterol is a rough measure of all the cholesterol in your blood.  LDL is the so-called bad cholesterol. This is the type that deposits cholesterol in the walls of the arteries. You want this level to be low.  HDL is the good cholesterol because it cleans the arteries and carries the LDL away. You want this level to be high.  Triglycerides are fat that the body can either burn for energy or store. High levels are closely linked to heart disease. DESIRED LEVELS:  Total cholesterol below 200.  LDL below 100 for people at risk, below 70 for very high risk.  HDL above 50 is good, above 60 is best.  Triglycerides below 150. HOW TO LOWER YOUR CHOLESTEROL:  Diet.  Choose fish or white meat chicken and [place], roasted or baked. Limit fatty cuts of red meat, fried foods, and processed meats, such as sausage and lunch meat.  Eat lots of fresh fruits and vegetables. Choose whole grains, beans, pasta, potatoes and cereals.  Use only small amounts of olive, corn or canola oils. Avoid butter, mayonnaise, shortening or palm kernel oils. Avoid foods with trans-fats.  Use skim/nonfat milk and low-fat/nonfat yogurt and cheeses. Avoid whole  milk, cream, ice cream, egg yolks and cheeses. Healthy desserts include angel food cake, ginger snaps, animal crackers, hard candy, popsicles, and low-fat/nonfat frozen yogurt. Avoid pastries, cakes, pies and cookies.  Exercise.  A regular program helps decrease LDL and raises HDL.  Helps with weight control.  Do things that increase your activity level like gardening, walking, or taking the stairs.  Medication.  May be prescribed by your caregiver to help lowering cholesterol and the risk for heart disease.  You may need medicine even if your levels are normal if you have several risk factors. HOME CARE INSTRUCTIONS   Follow your diet and exercise programs as suggested by your caregiver.  Take medications as directed.  Have blood work done when your caregiver feels it is necessary. MAKE SURE YOU:   Understand these instructions.  Will watch your condition.  Will get help right away if you are not doing well or get worse. Document Released: 12/03/2000 Document Revised: 06/02/2011 Document Reviewed: 05/26/2007 [facility] Patient Information 2013 [place], [place].

## 2012-04-12 NOTE — Telephone Encounter (Signed)
RX sent and detailed message on patients voicemail

## 2012-04-12 NOTE — Telephone Encounter (Signed)
Ok Simvastatin is a good alternative but not quite as strong so he should take 40 mg every day instead of every other day. Disp #30 and 5 rf and call if any troubles

## 2012-04-12 NOTE — Telephone Encounter (Signed)
Please advise 

## 2012-04-12 NOTE — Telephone Encounter (Signed)
Pharmacy comments:   Patient is requesting a cheaper alternative to crestor.

## 2012-04-13 ENCOUNTER — Telehealth: Payer: Self-pay

## 2012-04-13 NOTE — Telephone Encounter (Signed)
[place] co pay used to be $0.00 for Nexium but is now $25.00 and Pantoprazole 40 mg is $0.00  Ok to change per MD

## 2012-04-15 ENCOUNTER — Telehealth: Payer: Self-pay

## 2012-04-15 NOTE — Telephone Encounter (Signed)
Pt left a message stating that since he was switching to Zocor should he have blood work done in 6 weeks to make sure this is working? Please advise?  I did leave a message on patients voicemail stating that md is out of the office until Monday morning.

## 2012-04-16 NOTE — Telephone Encounter (Signed)
The numbers just do not change that quickly. Should recheck cholesterol and hepatic in roughly 12 weeks.

## 2012-04-16 NOTE — Telephone Encounter (Signed)
Message left on cell per DPR with information below.  Asked pt to call OR office at [phone] and ask for me if he has any additional questions.

## 2012-04-18 NOTE — Progress Notes (Signed)
Patient ID: George [name], male   DOB: 02-09-1951, 62 y.o.   MRN: [id number] George [name] [id number] 1950-10-29 04/18/2012      Progress Note-Follow Up  Subjective  Chief Complaint  Chief Complaint  Patient presents with  . Follow-up    6 month    HPI  Patient is a 62 year old Caucasian male who is in today for six-month followup. He is doing well. He has been following a heart healthy diet. Denies recent illness, fevers, chills, chest pain, palpitations, shortness of breath, GI or GU complaints. Tolerating Crestor well.  Past Medical History  Diagnosis Date  . High cholesterol   . GERD (gastroesophageal reflux disease)   . History of chicken pox     childhood  . Colon polyp   . CLL (chronic lymphocytic leukemia)     Past Surgical History  Procedure Date  . Hernia repair   . Esophageal dilation 2005    Family History  Problem Relation Age of Onset  . Hyperlipidemia Mother   . Hypertension Mother     mother-father  . Prostate cancer Neg Hx   . Breast cancer Neg Hx   . Colon cancer Neg Hx   . Diabetes      mother -father  . Heart disease Neg Hx   . Pulmonary fibrosis Mother     History   Social History  . Marital Status: Single    Spouse Name: N/A    Number of Children: N/A  . Years of Education: N/A   Occupational History  . Not on file.   Social History Main Topics  . Smoking status: Former Games developer  . Smokeless tobacco: Never Used     Comment: quit 22 years ago 1/2 ppd 10- years  . Alcohol Use: Yes     Comment: social  . Drug Use: Not on file  . Sexually Active: Not on file   Other Topics Concern  . Not on file   Social History Narrative  . No narrative on file    Current Outpatient Prescriptions on File Prior to Visit  Medication Sig Dispense Refill  . aspirin 81 MG EC tablet Take 81 mg by mouth daily.        . cholecalciferol (VITAMIN D) 1000 UNITS tablet Take 1,000 Units by mouth daily. 2 tablets daily      . Coenzyme Q10 (CO Q 10  PO) Take by mouth every morning.       Marland Kitchen GLUCOSAMINE HCL PO Take by mouth every morning.       Chilton Si Tea, Camillia sinensis, (GREEN TEA PO) Take by mouth every morning.       . Multiple Vitamin (MULTI-VITAMIN PO) Take by mouth every morning.       Marland Kitchen NIACIN CR PO Take 500 mg by mouth daily.       . Omega-3 Fatty Acids (FISH OIL CONCENTRATE PO) Take by mouth every morning.       . pantoprazole (PROTONIX) 40 MG tablet Take 40 mg by mouth daily.      . simvastatin (ZOCOR) 40 MG tablet Take 1 tablet (40 mg total) by mouth every evening.  30 tablet  5    No Known Allergies  Review of Systems  Review of Systems  Constitutional: Negative for fever and malaise/fatigue.  HENT: Negative for congestion.   Eyes: Negative for discharge.  Respiratory: Negative for shortness of breath.   Cardiovascular: Negative for chest pain, palpitations and leg swelling.  Gastrointestinal: Negative for nausea,  abdominal pain and diarrhea.  Genitourinary: Negative for dysuria.  Musculoskeletal: Negative for falls.  Skin: Negative for rash.  Neurological: Negative for loss of consciousness and headaches.  Endo/Heme/Allergies: Negative for polydipsia.  Psychiatric/Behavioral: Negative for depression and suicidal ideas. The patient is not nervous/anxious and does not have insomnia.     Objective  BP 138/92  Pulse 74  Temp 97.9 F (36.6 C) (Temporal)  Ht 5\' 7"  (1.702 m)  Wt 164 lb 0.6 oz (74.408 kg)  BMI 25.69 kg/m2  SpO2 96%  Physical Exam  Physical Exam  Constitutional: He is oriented to person, place, and time and well-developed, well-nourished, and in no distress. No distress.  HENT:  Head: Normocephalic and atraumatic.  Eyes: Conjunctivae normal are normal.  Neck: Neck supple. No thyromegaly present.  Cardiovascular: Normal rate, regular rhythm and normal heart sounds.   No murmur heard. Pulmonary/Chest: Effort normal and breath sounds normal. No respiratory distress.  Abdominal: He exhibits  no distension and no mass. There is no tenderness.  Musculoskeletal: He exhibits no edema.  Neurological: He is alert and oriented to person, place, and time.  Skin: Skin is warm.  Psychiatric: Memory, affect and judgment normal.    No results found for this basename: TSH   Lab Results  Component Value Date   WBC 40.8* 02/04/2012   HGB 14.5 02/04/2012   HCT 42.6 02/04/2012   MCV 91 02/04/2012   PLT 190 02/04/2012   Lab Results  Component Value Date   CREATININE 0.83 06/11/2011   BUN 13 06/11/2011   NA 140 06/11/2011   K 4.3 06/11/2011   CL 102 06/11/2011   CO2 24 06/11/2011   Lab Results  Component Value Date   ALT 20 10/01/2011   AST 31 10/01/2011   ALKPHOS 54 10/01/2011   BILITOT 0.8 10/01/2011   Lab Results  Component Value Date   CHOL 207* 10/01/2011   Lab Results  Component Value Date   HDL 74 10/01/2011   Lab Results  Component Value Date   LDLCALC 123* 10/01/2011   Lab Results  Component Value Date   TRIG 48 10/01/2011   Lab Results  Component Value Date   CHOLHDL 2.8 10/01/2011     Assessment & Plan  CLL (chronic lymphocytic leukemia) Follows with hematology no changes in status recently.  GERD (gastroesophageal reflux disease) No recent complaints. Continue Nexium use as indicated.  High cholesterol Tolerated Simvastatin in past and would like to switch back due to cost. Recheck levels at next visit.

## 2012-04-18 NOTE — Assessment & Plan Note (Addendum)
Tolerated Simvastatin in past and would like to switch back due to cost. Recheck levels at next visit.

## 2012-04-18 NOTE — Assessment & Plan Note (Signed)
No recent complaints. Continue Nexium use as indicated.

## 2012-04-18 NOTE — Assessment & Plan Note (Signed)
Follows with hematology no changes in status recently.

## 2012-04-19 ENCOUNTER — Other Ambulatory Visit: Payer: Self-pay

## 2012-04-19 MED ORDER — PANTOPRAZOLE SODIUM 40 MG PO TBEC: 40.0000 mg | DELAYED_RELEASE_TABLET | Freq: Every day | ORAL | Status: DC

## 2012-04-30 ENCOUNTER — Encounter: Payer: Self-pay | Admitting: Internal Medicine

## 2012-06-02 ENCOUNTER — Ambulatory Visit: Payer: 59 | Admitting: Hematology & Oncology

## 2012-06-02 ENCOUNTER — Other Ambulatory Visit: Payer: 59 | Admitting: Lab

## 2012-06-07 ENCOUNTER — Telehealth: Payer: Self-pay

## 2012-06-07 MED ORDER — ESOMEPRAZOLE MAGNESIUM 40 MG PO CPDR: 40.0000 mg | DELAYED_RELEASE_CAPSULE | Freq: Every day | ORAL | Status: DC

## 2012-06-07 NOTE — Telephone Encounter (Signed)
Pt left a message stating he would like to go back on Nexium? Please advise refill?

## 2012-06-07 NOTE — Telephone Encounter (Signed)
Nexium sent to pharmacy and left a vm for patient to return our call to the failure of Protonix.

## 2012-06-07 NOTE — Telephone Encounter (Signed)
No problem, just get him to tell you why so we can document the failure of the Pantoprazole in case insurance tries to argue with him He can have same strength and same sig he was on before, 30 d with 5 rf or 90 day with 1 at patient discretion

## 2012-06-08 ENCOUNTER — Other Ambulatory Visit: Payer: Self-pay | Admitting: Medical

## 2012-06-08 DIAGNOSIS — C9110 Chronic lymphocytic leukemia of B-cell type not having achieved remission: Secondary | ICD-10-CM

## 2012-06-09 ENCOUNTER — Other Ambulatory Visit (HOSPITAL_BASED_OUTPATIENT_CLINIC_OR_DEPARTMENT_OTHER): Payer: 59 | Admitting: Lab

## 2012-06-09 ENCOUNTER — Ambulatory Visit (HOSPITAL_BASED_OUTPATIENT_CLINIC_OR_DEPARTMENT_OTHER): Payer: 59 | Admitting: Medical

## 2012-06-09 VITALS — BP 114/82 | HR 59 | Temp 97.9°F | Resp 18 | Ht 67.0 in | Wt 163.0 lb

## 2012-06-09 DIAGNOSIS — C9110 Chronic lymphocytic leukemia of B-cell type not having achieved remission: Secondary | ICD-10-CM

## 2012-06-09 LAB — CBC WITH DIFFERENTIAL (CANCER CENTER ONLY)
EOS%: 0.4 % (ref 0.0–7.0)
Eosinophils Absolute: 0.2 10*3/uL (ref 0.0–0.5)
LYMPH%: 89.8 % — ABNORMAL HIGH (ref 14.0–48.0)
MCH: 31 pg (ref 28.0–33.4)
MCHC: 33.2 g/dL (ref 32.0–35.9)
MCV: 94 fL (ref 82–98)
MONO%: 3.3 % (ref 0.0–13.0)
Platelets: 237 10*3/uL (ref 145–400)
RBC: 4.64 10*6/uL (ref 4.20–5.70)
RDW: 13.2 % (ref 11.1–15.7)

## 2012-06-09 LAB — CHCC SATELLITE - SMEAR

## 2012-06-09 NOTE — Progress Notes (Signed)
DIAGNOSIS:  Chronic lymphocytic leukemia.  Current therapy Observation.  INTERIM HISTORY: George [name] presents today for an office followup visit.  His wife accompanies him.  Overall, he, reports, that he is doing quite well.  He's not reported any new problems or complaints.  His white count has remained stable since his last visit 4 months ago.  It was 40.8, and today it is 40.2.  He is not symptomatic.  He's not noticed any problems with fatigue.  He does not report any night sweats, or fevers.  He does not report any cough.  He's not noticed any swollen lymph glands.  He has a good appetite.  He denies any unintentional weight loss.  He denies any nausea, vomiting, diarrhea, constipation, chest pain, shortness of breath, fevers, chills.  He denies any abdominal pain.  He denies any lower leg swelling.  He denies any obvious, or abnormal bleeding.  He denies any headaches, visual changes, or rashes.  Review of Systems: Constitutional:Negative for malaise/fatigue, fever, chills, weight loss, diaphoresis, activity change, appetite change, and unexpected weight change.  HEENT: Negative for double vision, blurred vision, visual loss, ear pain, tinnitus, congestion, rhinorrhea, epistaxis sore throat or sinus disease, oral pain/lesion, tongue soreness Respiratory: Negative for cough, chest tightness, shortness of breath, wheezing and stridor.  Cardiovascular: Negative for chest pain, palpitations, leg swelling, orthopnea, PND, DOE or claudication Gastrointestinal: Negative for nausea, vomiting, abdominal pain, diarrhea, constipation, blood in stool, melena, hematochezia, abdominal distention, anal bleeding, rectal pain, anorexia and hematemesis.  Genitourinary: Negative for dysuria, frequency, hematuria,  Musculoskeletal: Negative for myalgias, back pain, joint swelling, arthralgias and gait problem.  Skin: Negative for rash, color change, pallor and wound.  Neurological:. Negative for  dizziness/light-headedness, tremors, seizures, syncope, facial asymmetry, speech difficulty, weakness, numbness, headaches and paresthesias.  Hematological: Negative for adenopathy. Does not bruise/bleed easily.  Psychiatric/Behavioral:  Negative for depression, no loss of interest in normal activity or change in sleep pattern.   Physical Exam: This is a 62 year old, well-developed, well-nourished, white gentleman, in no obvious distress Vitals: Temperature 97.9 degrees, pulse 59, respirations 18, blood pressure 142/82.  Weight 163 pounds HEENT reveals a normocephalic, atraumatic skull, no scleral icterus, no oral lesions  Neck is supple without any cervical or supraclavicular adenopathy.  Lungs are clear to auscultation bilaterally. There are no wheezes, rales or rhonci Cardiac is regular rate and rhythm with a normal S1 and S2. There are no murmurs, rubs, or bruits.  Abdomen is soft with good bowel sounds, there is no palpable mass. There is no palpable hepatosplenomegaly. There is no palpable fluid wave.  Musculoskeletal no tenderness of the spine, ribs, or hips.  Extremities there are no clubbing, cyanosis, or edema.  Skin no petechia, purpura or ecchymosis Neurologic is nonfocal.  Laboratory Data: And white count 40.2, hemoglobin 14.4, hematocrit 43.4, platelets 237,000  Current Outpatient Prescriptions on File Prior to Visit  Medication Sig Dispense Refill  . aspirin 81 MG EC tablet Take 81 mg by mouth daily.        . cholecalciferol (VITAMIN D) 1000 UNITS tablet Take 1,000 Units by mouth daily. 2 tablets daily      . Coenzyme Q10 (CO Q 10 PO) Take by mouth every morning.       Marland Kitchen GLUCOSAMINE HCL PO Take by mouth every morning.       Chilton Si Tea, Camillia sinensis, (GREEN TEA PO) Take by mouth every morning.       . Multiple Vitamin (MULTI-VITAMIN PO) Take by mouth  every morning.       Marland Kitchen NIACIN CR PO Take 500 mg by mouth daily.       . Omega-3 Fatty Acids (FISH OIL CONCENTRATE PO)  Take by mouth every morning.       . simvastatin (ZOCOR) 40 MG tablet Take 1 tablet (40 mg total) by mouth every evening.  30 tablet  5   No current facility-administered medications on file prior to visit.   Assessment/Plan: This is a pleasant, 62 year old, white gentleman, with the following issues:  #1.  Chronic lymphocytic leukemia.  He has stage A., chronic lymphocytic leukemia.  We have been following him for 4 years now.  His white count has not changed since the last time we saw him 4 months ago.  We will still have to continue to keep a close eye on his white cell count.  #2.  Followup.  We will follow back up with George [name] in about 2 months, but before then should there be questions or concerns.

## 2012-07-20 ENCOUNTER — Other Ambulatory Visit: Payer: 59 | Admitting: Lab

## 2012-07-20 ENCOUNTER — Ambulatory Visit: Payer: 59 | Admitting: Hematology & Oncology

## 2012-08-04 ENCOUNTER — Ambulatory Visit (HOSPITAL_BASED_OUTPATIENT_CLINIC_OR_DEPARTMENT_OTHER): Payer: 59 | Admitting: Hematology & Oncology

## 2012-08-04 ENCOUNTER — Other Ambulatory Visit (HOSPITAL_BASED_OUTPATIENT_CLINIC_OR_DEPARTMENT_OTHER): Payer: 59 | Admitting: Lab

## 2012-08-04 VITALS — BP 130/84 | HR 59 | Temp 97.8°F | Resp 18 | Ht 67.0 in | Wt 155.0 lb

## 2012-08-04 DIAGNOSIS — C9110 Chronic lymphocytic leukemia of B-cell type not having achieved remission: Secondary | ICD-10-CM

## 2012-08-04 LAB — CBC WITH DIFFERENTIAL (CANCER CENTER ONLY)
BASO#: 0.2 10*3/uL (ref 0.0–0.2)
BASO%: 0.4 % (ref 0.0–2.0)
EOS%: 0.1 % (ref 0.0–7.0)
HGB: 14 g/dL (ref 13.0–17.1)
MCH: 31.7 pg (ref 28.0–33.4)
MCHC: 33.4 g/dL (ref 32.0–35.9)
MONO%: 3.3 % (ref 0.0–13.0)
NEUT#: 3.1 10*3/uL (ref 1.5–6.5)
NEUT%: 7.7 % — ABNORMAL LOW (ref 40.0–80.0)
RDW: 13.3 % (ref 11.1–15.7)

## 2012-08-04 NOTE — Progress Notes (Signed)
This office note has been dictated.

## 2012-08-05 NOTE — Progress Notes (Signed)
CC:   Marguarite Arbour, MD  DIAGNOSIS:  CLL-stage A.  CURRENT THERAPY:  Observation.  INTERIM HISTORY:  Mr. Plant comes in for his followup.  He is really doing well.  He has really had no problems since we last saw him.  He has had no fevers, sweats, or chills.  There has been no cough or shortness of breath.  He has had no palpable lymph glands.  He has had no change in bowel or bladder habits.  Apparently, he and his wife will be traveling to [place] this summer.  PHYSICAL EXAMINATION:  General:  This is a well-developed, well- nourished white gentleman in no obvious distress.  Vital signs:  Show a temperature of 97.8, pulse 59, respiratory rate 18, blood pressure 130/84.  Weight is 155 pounds.  Head and neck:  Shows a normocephalic, atraumatic skull.  There are no ocular or oral lesions.  There are no palpable cervical or supraclavicular lymph nodes.  LUNGS:  Clear bilaterally. Cardiac:  Regular rate and rhythm with a normal S1 and S2.  There are no murmurs, rubs or bruits.  Abdominal:  Soft with good bowel sounds. There is no palpable abdominal mass.  There is no fluid wave.  No palpable hepatosplenomegaly is noted.  Back:  No tenderness over the spine, ribs, or hips.  Extremities:  Show no clubbing, cyanosis, or edema.  Skin:  No rashes, ecchymosis, or petechia.  LABORATORY STUDIES:  White cell count is 41, hemoglobin 14, hematocrit 41.9, platelet count 206.  White cell differential shows 8% segs, 89 lymphocytes, 10 monos.  IMPRESSION:  Mr. Laday is a nice 62 year old gentleman with chronic lymphocytic leukemia.  We have been following him now for about, I think, 61 or 4 years.  His white cell count really has been holding stable.  He is not anemic or thrombocytopenic.  He has no systemic symptoms.  We will go ahead and get him back in 6 months from now.  I really think that we can do the 6 month followup.  I do not think he needs any blood work in between  visits.    ______________________________ Josph Macho, M.D. PRE/MEDQ  D:  08/04/2012  T:  08/05/2012  Job:  [id number]

## 2012-08-06 LAB — PROTEIN ELECTROPHORESIS, SERUM
Albumin ELP: 64.8 % (ref 55.8–66.1)
Alpha-1-Globulin: 3.6 % (ref 2.9–4.9)
Beta 2: 5 % (ref 3.2–6.5)
Gamma Globulin: 12.5 % (ref 11.1–18.8)

## 2012-08-06 LAB — COMPREHENSIVE METABOLIC PANEL
AST: 29 U/L (ref 0–37)
Albumin: 4.7 g/dL (ref 3.5–5.2)
BUN: 11 mg/dL (ref 6–23)
Calcium: 9.6 mg/dL (ref 8.4–10.5)
Chloride: 100 mEq/L (ref 96–112)
Glucose, Bld: 90 mg/dL (ref 70–99)
Potassium: 3.9 mEq/L (ref 3.5–5.3)
Sodium: 137 mEq/L (ref 135–145)
Total Protein: 6.9 g/dL (ref 6.0–8.3)

## 2012-09-09 ENCOUNTER — Other Ambulatory Visit: Payer: Self-pay

## 2012-09-09 MED ORDER — ESOMEPRAZOLE MAGNESIUM 40 MG PO CPDR: 40.0000 mg | DELAYED_RELEASE_CAPSULE | Freq: Every day | ORAL | Status: DC

## 2012-09-09 NOTE — Telephone Encounter (Signed)
Patient informed that RX was sent.

## 2012-10-18 ENCOUNTER — Encounter: Payer: Self-pay | Admitting: Family Medicine

## 2012-10-18 ENCOUNTER — Ambulatory Visit (INDEPENDENT_AMBULATORY_CARE_PROVIDER_SITE_OTHER): Payer: 59 | Admitting: Family Medicine

## 2012-10-18 VITALS — BP 106/78 | HR 58 | Temp 98.0°F | Resp 16 | Wt 153.1 lb

## 2012-10-18 DIAGNOSIS — E7800 Pure hypercholesterolemia, unspecified: Secondary | ICD-10-CM

## 2012-10-18 DIAGNOSIS — C9110 Chronic lymphocytic leukemia of B-cell type not having achieved remission: Secondary | ICD-10-CM

## 2012-10-18 DIAGNOSIS — K219 Gastro-esophageal reflux disease without esophagitis: Secondary | ICD-10-CM

## 2012-10-18 NOTE — Patient Instructions (Signed)
Digestive Advantage probiotic daily   Gastroesophageal Reflux Disease, Adult Gastroesophageal reflux disease (GERD) happens when acid from your stomach flows up into the esophagus. When acid comes in contact with the esophagus, the acid causes soreness (inflammation) in the esophagus. Over time, GERD may create small holes (ulcers) in the lining of the esophagus. CAUSES   Increased body weight. This puts pressure on the stomach, making acid rise from the stomach into the esophagus.  Smoking. This increases acid production in the stomach.  Drinking alcohol. This causes decreased pressure in the lower esophageal sphincter (valve or ring of muscle between the esophagus and stomach), allowing acid from the stomach into the esophagus.  Late evening meals and a full stomach. This increases pressure and acid production in the stomach.  A malformed lower esophageal sphincter. Sometimes, no cause is found. SYMPTOMS   Burning pain in the lower part of the mid-chest behind the breastbone and in the mid-stomach area. This may occur twice a week or more often.  Trouble swallowing.  Sore throat.  Dry cough.  Asthma-like symptoms including chest tightness, shortness of breath, or wheezing. DIAGNOSIS  Your caregiver may be able to diagnose GERD based on your symptoms. In some cases, X-rays and other tests may be done to check for complications or to check the condition of your stomach and esophagus. TREATMENT  Your caregiver may recommend over-the-counter or prescription medicines to help decrease acid production. Ask your caregiver before starting or adding any new medicines.  HOME CARE INSTRUCTIONS   Change the factors that you can control. Ask your caregiver for guidance concerning weight loss, quitting smoking, and alcohol consumption.  Avoid foods and drinks that make your symptoms worse, such as:  Caffeine or alcoholic drinks.  Chocolate.  Peppermint or mint flavorings.  Garlic and  onions.  Spicy foods.  Citrus fruits, such as oranges, lemons, or limes.  Tomato-based foods such as sauce, chili, salsa, and pizza.  Fried and fatty foods.  Avoid lying down for the 3 hours prior to your bedtime or prior to taking a nap.  Eat small, frequent meals instead of large meals.  Wear loose-fitting clothing. Do not wear anything tight around your waist that causes pressure on your stomach.  Raise the head of your bed 6 to 8 inches with wood blocks to help you sleep. Extra pillows will not help.  Only take over-the-counter or prescription medicines for pain, discomfort, or fever as directed by your caregiver.  Do not take aspirin, ibuprofen, or other nonsteroidal anti-inflammatory drugs (NSAIDs). SEEK IMMEDIATE MEDICAL CARE IF:   You have pain in your arms, neck, jaw, teeth, or back.  Your pain increases or changes in intensity or duration.  You develop nausea, vomiting, or sweating (diaphoresis).  You develop shortness of breath, or you faint.  Your vomit is green, yellow, black, or looks like coffee grounds or blood.  Your stool is red, bloody, or black. These symptoms could be signs of other problems, such as heart disease, gastric bleeding, or esophageal bleeding. MAKE SURE YOU:   Understand these instructions.  Will watch your condition.  Will get help right away if you are not doing well or get worse. Document Released: 12/18/2004 Document Revised: 06/02/2011 Document Reviewed: 09/27/2010 [facility] Patient Information 2014 [place], [place].

## 2012-10-18 NOTE — Assessment & Plan Note (Signed)
Not controlled on Pantoprazole, switched back to Nexium with good results, encouraged to avoid offending foods and start a probiotic

## 2012-10-18 NOTE — Assessment & Plan Note (Addendum)
Had labs drawn on Friday, encouraged krill oil. Taking simvastatin but not working as well as [employer], will switch back if patient is willing, aavoid trans fats

## 2012-10-18 NOTE — Assessment & Plan Note (Signed)
Following with Cancer center, stable doing well

## 2012-10-18 NOTE — Progress Notes (Signed)
Patient ID: George [name], male   DOB: April 28, 1950, 62 y.o.   MRN: [id number] EBENEZER MCCASKEY [id number] 04/07/1950 10/18/2012      Progress Note-Follow Up  Subjective  Chief Complaint  Chief Complaint  Patient presents with  . Hyperlipidemia    Pt here for fasting follow up.  . CLL  . Herpes Zoster    Pt wants to discuss need for shingles vaccine.    HPI  Patient is a 62 year old Caucasian male who is in today for followup. Feeling well. No recent illness. His following with the cancer Center for his CLL in his disease has been stable. No recent fevers or headache. No chest pain or palpitations. No shortness or breath GI or GU concerns noted at this time. He is taking his simvastatin as prescribed without any difficulties  Past Medical History  Diagnosis Date  . High cholesterol   . GERD (gastroesophageal reflux disease)   . History of chicken pox     childhood  . Colon polyp   . CLL (chronic lymphocytic leukemia)     Past Surgical History  Procedure Laterality Date  . Hernia repair    . Esophageal dilation  2005    Family History  Problem Relation Age of Onset  . Hyperlipidemia Mother   . Hypertension Mother     mother-father  . Prostate cancer Neg Hx   . Breast cancer Neg Hx   . Colon cancer Neg Hx   . Diabetes      mother -father  . Heart disease Neg Hx   . Pulmonary fibrosis Mother     History   Social History  . Marital Status: Single    Spouse Name: N/A    Number of Children: N/A  . Years of Education: N/A   Occupational History  . Not on file.   Social History Main Topics  . Smoking status: Former Games developer  . Smokeless tobacco: Never Used     Comment: quit 22 years ago 1/2 ppd 10- years  . Alcohol Use: Yes     Comment: social  . Drug Use: Not on file  . Sexually Active: Not on file   Other Topics Concern  . Not on file   Social History Narrative  . No narrative on file    Current Outpatient Prescriptions on File Prior to Visit   Medication Sig Dispense Refill  . aspirin 81 MG EC tablet Take 81 mg by mouth daily.        . cholecalciferol (VITAMIN D) 1000 UNITS tablet Take 1,000 Units by mouth daily. 2 tablets daily      . Coenzyme Q10 (CO Q 10 PO) Take by mouth every morning.       Marland Kitchen esomeprazole (NEXIUM) 40 MG capsule Take 1 capsule (40 mg total) by mouth daily.  30 capsule  3  . GLUCOSAMINE HCL PO Take by mouth every morning.       Chilton Si Tea, Camillia sinensis, (GREEN TEA PO) Take by mouth every morning.       . Multiple Vitamin (MULTI-VITAMIN PO) Take by mouth every morning.       Marland Kitchen NIACIN CR PO Take 500 mg by mouth daily.       . Omega-3 Fatty Acids (FISH OIL CONCENTRATE PO) Take by mouth every morning.       . simvastatin (ZOCOR) 40 MG tablet Take 1 tablet (40 mg total) by mouth every evening.  30 tablet  5   No current facility-administered  medications on file prior to visit.    No Known Allergies  Review of Systems  Review of Systems  Eyes: Positive for pain.    Objective  BP 106/78  Pulse 58  Temp(Src) 98 F (36.7 C) (Oral)  Resp 16  Wt 153 lb 1.3 oz (69.437 kg)  BMI 23.97 kg/m2  SpO2 98%  Physical Exam  Physical Exam  Constitutional: He is oriented to person, place, and time and well-developed, well-nourished, and in no distress. No distress.  HENT:  Head: Normocephalic and atraumatic.  Eyes: Conjunctivae are normal.  Neck: Neck supple. No thyromegaly present.  Cardiovascular: Normal rate, regular rhythm and normal heart sounds.  Exam reveals no gallop.   No murmur heard. Pulmonary/Chest: Effort normal and breath sounds normal. No respiratory distress.  Abdominal: He exhibits no distension and no mass. There is no tenderness.  Musculoskeletal: He exhibits no edema.  Neurological: He is alert and oriented to person, place, and time.  Skin: Skin is warm.  Psychiatric: Memory, affect and judgment normal.    No results found for this basename: TSH   Lab Results  Component Value  Date   WBC 41.0* 08/04/2012   HGB 14.0 08/04/2012   HCT 41.9 08/04/2012   MCV 95 08/04/2012   PLT 206 08/04/2012   Lab Results  Component Value Date   CREATININE 0.78 08/04/2012   BUN 11 08/04/2012   NA 137 08/04/2012   K 3.9 08/04/2012   CL 100 08/04/2012   CO2 26 08/04/2012   Lab Results  Component Value Date   ALT 21 08/04/2012   AST 29 08/04/2012   ALKPHOS 53 08/04/2012   BILITOT 0.8 08/04/2012   Lab Results  Component Value Date   CHOL 207* 10/01/2011   Lab Results  Component Value Date   HDL 74 10/01/2011   Lab Results  Component Value Date   LDLCALC 123* 10/01/2011   Lab Results  Component Value Date   TRIG 48 10/01/2011   Lab Results  Component Value Date   CHOLHDL 2.8 10/01/2011     Assessment & Plan  GERD (gastroesophageal reflux disease) Not controlled on Pantoprazole, switched back to Nexium with good results, encouraged to avoid offending foods and start a probiotic  High cholesterol Had labs drawn on Friday, encouraged krill oil. 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

## 2012-10-19 ENCOUNTER — Telehealth: Payer: Self-pay

## 2012-10-19 NOTE — Telephone Encounter (Signed)
Per MD pt had labs done at [facility].  I called and talked to a representative at [facility] and she is going to fax the paperwork to MD.

## 2012-10-22 ENCOUNTER — Encounter: Payer: Self-pay | Admitting: Family Medicine

## 2012-10-26 ENCOUNTER — Other Ambulatory Visit: Payer: Self-pay | Admitting: Family Medicine

## 2012-10-26 MED ORDER — ROSUVASTATIN CALCIUM 40 MG PO TABS: 40.0000 mg | ORAL_TABLET | Freq: Every day | ORAL | Status: DC

## 2012-10-26 NOTE — Telephone Encounter (Signed)
Message copied by Court Joy on Tue Oct 26, 2012 11:18 AM ------      Message from: Danise Edge A      Created: Thu Oct 21, 2012 10:04 PM       So let him know his cholesterol has gone back up to 227, recommend he switch back to Crestor 40 mg daily ------

## 2012-10-26 NOTE — Telephone Encounter (Signed)
I left a detailed message on patients vm and sent the new RX in

## 2013-01-05 ENCOUNTER — Other Ambulatory Visit (HOSPITAL_BASED_OUTPATIENT_CLINIC_OR_DEPARTMENT_OTHER): Payer: 59 | Admitting: Lab

## 2013-01-05 ENCOUNTER — Ambulatory Visit (HOSPITAL_BASED_OUTPATIENT_CLINIC_OR_DEPARTMENT_OTHER): Payer: 59 | Admitting: Hematology & Oncology

## 2013-01-05 VITALS — BP 138/84 | HR 60 | Temp 98.1°F | Resp 18 | Ht 67.0 in | Wt 157.0 lb

## 2013-01-05 DIAGNOSIS — C9110 Chronic lymphocytic leukemia of B-cell type not having achieved remission: Secondary | ICD-10-CM

## 2013-01-05 LAB — CBC WITH DIFFERENTIAL (CANCER CENTER ONLY)
BASO#: 0 10*3/uL (ref 0.0–0.2)
Eosinophils Absolute: 0.1 10*3/uL (ref 0.0–0.5)
HGB: 14.2 g/dL (ref 13.0–17.1)
MCH: 31.3 pg (ref 28.0–33.4)
MCHC: 33.1 g/dL (ref 32.0–35.9)
Platelets: 203 10*3/uL (ref 145–400)

## 2013-01-05 LAB — CHCC SATELLITE - SMEAR

## 2013-01-05 NOTE — Progress Notes (Signed)
This office note has been dictated.

## 2013-01-06 NOTE — Progress Notes (Signed)
DIAGNOSIS:  Chronic lymphocytic leukemia - stage A.  CURRENT THERAPY:  Observation.  INTERIM HISTORY:  George [name] comes in for followup.  As always, he is doing well.  We last saw him back in May.  He and his wife had a great time this summer.  They went to [place] for a couple of weeks.  They really enjoyed it out there.  They are thinking about moving out there.  He has had no problem with fevers, sweats, or chills.  He has had no palpable lymph glands.  He has had no abdominal pain.  His appetite has been good.  There has been no change in bowel or bladder habits.  PHYSICAL EXAMINATION:  General:  This is a well-developed, well- nourished white gentleman in no obvious distress.  Vital Signs:  Show a temperature of 98.1, pulse 60, respiratory rate of 18, blood pressure 138/84.  Weight is 157 pounds.  Head and Neck:  Exam showed a normocephalic, atraumatic skull.  There are no ocular or oral lesions. There are no palpable cervical or supraclavicular lymph nodes.  Lungs: Clear bilaterally.  Cardiac:  Regular rate and rhythm with a normal S1 and S2.  There are no murmurs, rubs, or bruits.  Axillary:  Exam shows no bilateral axillary adenopathy.  Abdomen:  Soft.  He has good bowel sounds.  There is no fluid wave.  There is no palpable abdominal mass. There is no palpable hepatosplenomegaly.  Back:  No tenderness over the spine, ribs, or hips.  Extremities:  Show no clubbing, cyanosis, or edema.  Neurological:  Exam shows no focal neurological deficits.  LABORATORY STUDIES:  White cell count is 47, hemoglobin 14.2, hematocrit 42.9, platelet count 203.  MCV is 95.  The peripheral smear shows some smudge cells.  He has a few atypical lymphocytes.  He has no nucleated red blood cells.  Platelets are okay.  There are no immature myeloid cells.  There is no rouleaux formation.  IMPRESSION:  George [name] is a 62 year old gentleman with chronic lymphocytic leukemia.  He has stage A chronic  lymphocytic leukemia.  He is doing very well.  We have been seeing him now for about 4 years.  I still do not see any indication for therapy.  We will still plan to get him back in 6 months.    ______________________________ Josph Macho, M.D. PRE/MEDQ  D:  01/04/2013  T:  01/06/2013  Job:  [id number]

## 2013-01-27 ENCOUNTER — Other Ambulatory Visit: Payer: Self-pay

## 2013-01-27 ENCOUNTER — Other Ambulatory Visit: Payer: Self-pay | Admitting: Family Medicine

## 2013-04-21 ENCOUNTER — Telehealth: Payer: Self-pay | Admitting: Family Medicine

## 2013-04-21 ENCOUNTER — Other Ambulatory Visit: Payer: Self-pay | Admitting: Family Medicine

## 2013-04-21 ENCOUNTER — Ambulatory Visit (INDEPENDENT_AMBULATORY_CARE_PROVIDER_SITE_OTHER): Payer: 59 | Admitting: Family Medicine

## 2013-04-21 ENCOUNTER — Encounter: Payer: Self-pay | Admitting: Family Medicine

## 2013-04-21 VITALS — BP 132/90 | HR 63 | Temp 98.0°F | Ht 67.0 in | Wt 166.0 lb

## 2013-04-21 DIAGNOSIS — E782 Mixed hyperlipidemia: Secondary | ICD-10-CM | POA: Insufficient documentation

## 2013-04-21 DIAGNOSIS — E785 Hyperlipidemia, unspecified: Secondary | ICD-10-CM

## 2013-04-21 DIAGNOSIS — C9110 Chronic lymphocytic leukemia of B-cell type not having achieved remission: Secondary | ICD-10-CM

## 2013-04-21 DIAGNOSIS — K219 Gastro-esophageal reflux disease without esophagitis: Secondary | ICD-10-CM

## 2013-04-21 DIAGNOSIS — Z0000 Encounter for general adult medical examination without abnormal findings: Secondary | ICD-10-CM

## 2013-04-21 DIAGNOSIS — R252 Cramp and spasm: Secondary | ICD-10-CM | POA: Insufficient documentation

## 2013-04-21 LAB — RENAL FUNCTION PANEL
Albumin: 4.8 g/dL (ref 3.5–5.2)
BUN: 11 mg/dL (ref 6–23)
CO2: 28 mEq/L (ref 19–32)
Calcium: 9.8 mg/dL (ref 8.4–10.5)
Chloride: 100 mEq/L (ref 96–112)
Creat: 0.67 mg/dL (ref 0.50–1.35)
GLUCOSE: 88 mg/dL (ref 70–99)
PHOSPHORUS: 3.3 mg/dL (ref 2.3–4.6)
POTASSIUM: 4.4 meq/L (ref 3.5–5.3)
SODIUM: 140 meq/L (ref 135–145)

## 2013-04-21 LAB — LIPID PANEL
CHOL/HDL RATIO: 3.3 ratio
Cholesterol: 215 mg/dL — ABNORMAL HIGH (ref 0–200)
HDL: 66 mg/dL (ref >39)
LDL Cholesterol: 126 mg/dL — ABNORMAL HIGH (ref 0–99)
Triglycerides: 116 mg/dL (ref <150)
VLDL: 23 mg/dL (ref 0–40)

## 2013-04-21 LAB — MAGNESIUM: MAGNESIUM: 1.9 mg/dL (ref 1.5–2.5)

## 2013-04-21 LAB — HEPATIC FUNCTION PANEL
ALT: 29 U/L (ref 0–53)
AST: 36 U/L (ref 0–37)
Albumin: 4.8 g/dL (ref 3.5–5.2)
Alkaline Phosphatase: 62 U/L (ref 39–117)
BILIRUBIN DIRECT: 0.2 mg/dL (ref 0.0–0.3)
BILIRUBIN INDIRECT: 0.7 mg/dL (ref 0.2–1.2)
Total Bilirubin: 0.9 mg/dL (ref 0.2–1.2)
Total Protein: 7.5 g/dL (ref 6.0–8.3)

## 2013-04-21 LAB — CBC
HCT: 43.9 % (ref 39.0–52.0)
HEMOGLOBIN: 14.9 g/dL (ref 13.0–17.0)
MCH: 30.4 pg (ref 26.0–34.0)
MCHC: 33.9 g/dL (ref 30.0–36.0)
MCV: 89.6 fL (ref 78.0–100.0)
Platelets: 232 10*3/uL (ref 150–400)
RBC: 4.9 MIL/uL (ref 4.22–5.81)
RDW: 13.3 % (ref 11.5–15.5)
WBC: 52.3 10*3/uL — ABNORMAL HIGH (ref 4.0–10.5)

## 2013-04-21 LAB — CK: Total CK: 79 U/L (ref 7–232)

## 2013-04-21 LAB — TSH: TSH: 1.497 u[IU]/mL (ref 0.350–4.500)

## 2013-04-21 MED ORDER — CRESTOR 40 MG PO TABS: 40.0000 mg | ORAL_TABLET | Freq: Every day | ORAL | Status: DC

## 2013-04-21 MED ORDER — NEXIUM 40 MG PO CPDR: 40.0000 mg | DELAYED_RELEASE_CAPSULE | Freq: Every day | ORAL | Status: DC

## 2013-04-21 NOTE — Assessment & Plan Note (Signed)
Well controlled on Nexium, encouraged probiotic use today

## 2013-04-21 NOTE — Telephone Encounter (Signed)
Lab order [id number] Lipid, renal cbc, tsh, hepatic, psa prior to annual visit at next

## 2013-04-21 NOTE — Patient Instructions (Signed)
64 oz + clear fluids Magnesium Hyland's night time leg cramp meds as needed  stretch   DASH Diet The DASH diet stands for "Dietary Approaches to Stop Hypertension." It is a healthy eating plan that has been shown to reduce high blood pressure (hypertension) in as little as 14 days, while also possibly providing other significant health benefits. These other health benefits include reducing the risk of breast cancer after menopause and reducing the risk of type 2 diabetes, heart disease, colon cancer, and stroke. Health benefits also include weight loss and slowing kidney failure in patients with chronic kidney disease.  DIET GUIDELINES  Limit salt (sodium). Your diet should contain less than 1500 mg of sodium daily.  Limit refined or processed carbohydrates. Your diet should include mostly whole grains. Desserts and added sugars should be used sparingly.  Include small amounts of heart-healthy fats. These types of fats include nuts, oils, and tub margarine. Limit saturated and trans fats. These fats have been shown to be harmful in the body. CHOOSING FOODS  The following food groups are based on a 2000 calorie diet. See your Registered Dietitian for individual calorie needs. Grains and Grain Products (6 to 8 servings daily)  Eat More Often: Whole-wheat bread, brown rice, whole-grain or wheat pasta, quinoa, popcorn without added fat or salt (air popped).  Eat Less Often: White bread, white pasta, white rice, cornbread. Vegetables (4 to 5 servings daily)  Eat More Often: Fresh, frozen, and canned vegetables. Vegetables may be raw, steamed, roasted, or grilled with a minimal amount of fat.  Eat Less Often/Avoid: Creamed or fried vegetables. Vegetables in a cheese sauce. Fruit (4 to 5 servings daily)  Eat More Often: All fresh, canned (in natural juice), or frozen fruits. Dried fruits without added sugar. One hundred percent fruit juice ( cup [237 mL] daily).  Eat Less Often: Dried fruits  with added sugar. Canned fruit in light or heavy syrup. [employer], Fish, and Poultry (2 servings or less daily. One serving is 3 to 4 oz [85-114 g]).  Eat More Often: Ninety percent or leaner ground beef, tenderloin, sirloin. Round cuts of beef, chicken breast, [place] breast. All fish. Grill, bake, or broil your meat. Nothing should be fried.  Eat Less Often/Avoid: Fatty cuts of meat, [place], or chicken leg, thigh, or wing. Fried cuts of meat or fish. Dairy (2 to 3 servings)  Eat More Often: Low-fat or fat-free milk, low-fat plain or light yogurt, reduced-fat or part-skim cheese.  Eat Less Often/Avoid: Milk (whole, 2%).Whole milk yogurt. Full-fat cheeses. Nuts, Seeds, and Legumes (4 to 5 servings per week)  Eat More Often: All without added salt.  Eat Less Often/Avoid: Salted nuts and seeds, canned beans with added salt. Fats and Sweets (limited)  Eat More Often: Vegetable oils, tub margarines without trans fats, sugar-free gelatin. Mayonnaise and salad dressings.  Eat Less Often/Avoid: Coconut oils, palm oils, butter, stick margarine, cream, half and half, cookies, candy, pie. FOR MORE INFORMATION The Dash Diet Eating Plan: www.dashdiet.org Document Released: 02/27/2011 Document Revised: 06/02/2011 Document Reviewed: 02/27/2011 [facility] Patient Information 2014 [place], [place]. Muscle Cramps and Spasms Muscle cramps and spasms occur when a muscle or muscles tighten and you have no control over this tightening (involuntary muscle contraction). They are a common problem and can develop in any muscle. The most common place is in the calf muscles of the leg. Both muscle cramps and muscle spasms are involuntary muscle contractions, but they also have differences:   Muscle cramps are sporadic  and painful. They may last a few seconds to a quarter of an hour. Muscle cramps are often more forceful and last longer than muscle spasms.  Muscle spasms may or may not be painful. They may also last  just a few seconds or much longer. CAUSES  It is uncommon for cramps or spasms to be due to a serious underlying problem. In many cases, the cause of cramps or spasms is unknown. Some common causes are:   Overexertion.   Overuse from repetitive motions (doing the same thing over and over).   Remaining in a certain position for a long period of time.   Improper preparation, form, or technique while performing a sport or activity.   Dehydration.   Injury.   Side effects of some medicines.   Abnormally low levels of the salts and ions in your blood (electrolytes), especially potassium and calcium. This could happen if you are taking water pills (diuretics) or you are pregnant.  Some underlying medical problems can make it more likely to develop cramps or spasms. These include, but are not limited to:   Diabetes.   Parkinson disease.   Hormone disorders, such as thyroid problems.   Alcohol abuse.   Diseases specific to muscles, joints, and bones.   Blood vessel disease where not enough blood is getting to the muscles.  HOME CARE INSTRUCTIONS   Stay well hydrated. Drink enough water and fluids to keep your urine clear or pale yellow.  It may be helpful to massage, stretch, and relax the affected muscle.  For tight or tense muscles, use a warm towel, heating pad, or hot shower water directed to the affected area.  If you are sore or have pain after a cramp or spasm, applying ice to the affected area may relieve discomfort.  Put ice in a plastic bag.  Place a towel between your skin and the bag.  Leave the ice on for 15-20 minutes, 03-04 times a day.  Medicines used to treat a known cause of cramps or spasms may help reduce their frequency or severity. Only take over-the-counter or prescription medicines as directed by your caregiver. SEEK MEDICAL CARE IF:  Your cramps or spasms get more severe, more frequent, or do not improve over time.  MAKE SURE YOU:    Understand these instructions.  Will watch your condition.  Will get help right away if you are not doing well or get worse. Document Released: 08/30/2001 Document Revised: 07/05/2012 Document Reviewed: 02/25/2012 [facility] Patient Information 2014 [place], [place].

## 2013-04-21 NOTE — Progress Notes (Signed)
Pre visit review using our clinic review tool, if applicable. No additional management support is needed unless otherwise documented below in the visit note. 

## 2013-04-21 NOTE — Assessment & Plan Note (Signed)
Infrequent. Happening at night. Encouraged increased hydration, stretching, magnesium and consider Hyland's night time leg cramp med. Report if worsens, check ck and magnesium levels today

## 2013-04-21 NOTE — Assessment & Plan Note (Signed)
Given refills on Crestor and check lipids today

## 2013-04-21 NOTE — Assessment & Plan Note (Signed)
Following with Dr Marin Olp doing well.

## 2013-04-21 NOTE — Progress Notes (Signed)
Patient ID: George [name], male   DOB: 09-24-1950, 63 y.o.   MRN: [id number] George [name] [id number] December 21, 1950 04/21/2013      Progress Note-Follow Up  Subjective  Chief Complaint  Chief Complaint  Patient presents with  . Follow-up    6 month    HPI  Patient is a 63 year old male in today for follow up. Doing well. Only complaint is occasional muscle cramps at night. They awaken him and resolved quickly. No significant pattern. He notes it seems to be worse in the winter than the summer. Otherwise feels well. No recent illness, chest pain, palpitations, shortness of breath, GI or GU concerns noted today. Does need refills on medications.  Past Medical History  Diagnosis Date  . High cholesterol   . GERD (gastroesophageal reflux disease)   . History of chicken pox     childhood  . Colon polyp   . CLL (chronic lymphocytic leukemia)     Past Surgical History  Procedure Laterality Date  . Hernia repair    . Esophageal dilation  2005    Family History  Problem Relation Age of Onset  . Hyperlipidemia Mother   . Hypertension Mother     mother-father  . Prostate cancer Neg Hx   . Breast cancer Neg Hx   . Colon cancer Neg Hx   . Diabetes      mother -father  . Heart disease Neg Hx   . Pulmonary fibrosis Mother     History   Social History  . Marital Status: Single    Spouse Name: N/A    Number of Children: N/A  . Years of Education: N/A   Occupational History  . Not on file.   Social History Main Topics  . Smoking status: Former Research scientist (life sciences)  . Smokeless tobacco: Never Used     Comment: quit 22 years ago 1/2 ppd 10- years  . Alcohol Use: Yes     Comment: social  . Drug Use: Not on file  . Sexual Activity: Not on file   Other Topics Concern  . Not on file   Social History Narrative  . No narrative on file    Current Outpatient Prescriptions on File Prior to Visit  Medication Sig Dispense Refill  . aspirin 81 MG EC tablet Take 81 mg by mouth daily.         . cholecalciferol (VITAMIN D) 1000 UNITS tablet Take 1,000 Units by mouth daily. 2 tablets daily      . Coenzyme Q10 (CO Q 10 PO) Take by mouth every morning.       Marland Kitchen GLUCOSAMINE HCL PO Take by mouth every morning.       Nyoka Cowden Tea, Camillia sinensis, (GREEN TEA PO) Take by mouth every morning.       . Multiple Vitamin (MULTI-VITAMIN PO) Take by mouth every morning.       Marland Kitchen NIACIN CR PO Take 500 mg by mouth daily.       . Omega-3 Fatty Acids (FISH OIL CONCENTRATE PO) Take by mouth every morning.        No current facility-administered medications on file prior to visit.    No Known Allergies  Review of Systems  Review of Systems  Constitutional: Negative for fever and malaise/fatigue.  HENT: Negative for congestion.   Eyes: Negative for discharge.  Respiratory: Negative for shortness of breath.   Cardiovascular: Negative for chest pain, palpitations and leg swelling.  Gastrointestinal: Negative for nausea, abdominal pain and  diarrhea.  Genitourinary: Negative for dysuria.  Musculoskeletal: Positive for myalgias. Negative for falls.  Skin: Negative for rash.  Neurological: Negative for loss of consciousness and headaches.  Endo/Heme/Allergies: Negative for polydipsia.  Psychiatric/Behavioral: Negative for depression and suicidal ideas. The patient is not nervous/anxious and does not have insomnia.     Objective  BP 132/90  Pulse 63  Temp(Src) 98 F (36.7 C) (Oral)  Ht 5\' 7"  (1.702 m)  Wt 166 lb (75.297 kg)  BMI 25.99 kg/m2  SpO2 98%  Physical Exam  Physical Exam  Constitutional: He is oriented to person, place, and time and well-developed, well-nourished, and in no distress. No distress.  HENT:  Head: Normocephalic and atraumatic.  Eyes: Conjunctivae are normal.  Neck: Neck supple. No thyromegaly present.  Cardiovascular: Normal rate, regular rhythm and normal heart sounds.   No murmur heard. Pulmonary/Chest: Effort normal and breath sounds normal. No  respiratory distress.  Abdominal: He exhibits no distension and no mass. There is no tenderness.  Musculoskeletal: He exhibits no edema.  Neurological: He is alert and oriented to person, place, and time.  Skin: Skin is warm.  Psychiatric: Memory, affect and judgment normal.    No results found for this basename: TSH   Lab Results  Component Value Date   WBC 47.0* 01/05/2013   HGB 14.2 01/05/2013   HCT 42.9 01/05/2013   MCV 95 01/05/2013   PLT 203 01/05/2013   Lab Results  Component Value Date   CREATININE 0.78 08/04/2012   BUN 11 08/04/2012   NA 137 08/04/2012   K 3.9 08/04/2012   CL 100 08/04/2012   CO2 26 08/04/2012   Lab Results  Component Value Date   ALT 21 08/04/2012   AST 29 08/04/2012   ALKPHOS 53 08/04/2012   BILITOT 0.8 08/04/2012   Lab Results  Component Value Date   CHOL 207* 10/01/2011   Lab Results  Component Value Date   HDL 74 10/01/2011   Lab Results  Component Value Date   LDLCALC 123* 10/01/2011   Lab Results  Component Value Date   TRIG 48 10/01/2011   Lab Results  Component Value Date   CHOLHDL 2.8 10/01/2011     Assessment & Plan  CLL (chronic lymphocytic leukemia) Following with Dr Marin Olp doing well.  Muscle cramp Infrequent. Happening at night. Encouraged increased hydration, stretching, magnesium and consider Hyland's night time leg cramp med. Report if worsens, check ck and magnesium levels today  Other and unspecified hyperlipidemia Given refills on Crestor and check lipids today  GERD (gastroesophageal reflux disease) Well controlled on Nexium, encouraged probiotic use today

## 2013-04-25 NOTE — Telephone Encounter (Signed)
Lab order placed.

## 2013-06-23 ENCOUNTER — Telehealth: Payer: Self-pay | Admitting: Family Medicine

## 2013-06-23 ENCOUNTER — Other Ambulatory Visit: Payer: Self-pay | Admitting: Family Medicine

## 2013-06-23 DIAGNOSIS — K219 Gastro-esophageal reflux disease without esophagitis: Secondary | ICD-10-CM

## 2013-06-23 NOTE — Telephone Encounter (Signed)
Please advise 

## 2013-06-23 NOTE — Telephone Encounter (Signed)
He saw Dr Watt Climes at [place] for his last colonoscopy.  It has been 5 years and he is due for a colonoscopy  He would like to change  to [employer].   Please enter a referral

## 2013-07-06 ENCOUNTER — Ambulatory Visit (HOSPITAL_BASED_OUTPATIENT_CLINIC_OR_DEPARTMENT_OTHER): Payer: 59 | Admitting: Hematology & Oncology

## 2013-07-06 ENCOUNTER — Other Ambulatory Visit (HOSPITAL_BASED_OUTPATIENT_CLINIC_OR_DEPARTMENT_OTHER): Payer: 59 | Admitting: Lab

## 2013-07-06 ENCOUNTER — Encounter: Payer: Self-pay | Admitting: Hematology & Oncology

## 2013-07-06 VITALS — BP 141/89 | HR 56 | Temp 97.9°F | Resp 18 | Ht 67.0 in | Wt 158.0 lb

## 2013-07-06 DIAGNOSIS — C9110 Chronic lymphocytic leukemia of B-cell type not having achieved remission: Secondary | ICD-10-CM

## 2013-07-06 LAB — CBC WITH DIFFERENTIAL (CANCER CENTER ONLY)
HCT: 43.1 % (ref 38.7–49.9)
HGB: 14.3 g/dL (ref 13.0–17.1)
MCH: 31 pg (ref 28.0–33.4)
MCHC: 33.2 g/dL (ref 32.0–35.9)
MCV: 93 fL (ref 82–98)
Platelets: 215 10*3/uL (ref 145–400)
RBC: 4.62 10*6/uL (ref 4.20–5.70)
RDW: 14.1 % (ref 11.1–15.7)
WBC: 48.5 10*3/uL — ABNORMAL HIGH (ref 4.0–10.0)

## 2013-07-06 LAB — MANUAL DIFFERENTIAL (CHCC SATELLITE)
ALC: 43.6 10*3/uL — ABNORMAL HIGH (ref 0.9–3.3)
ANC (CHCC MAN DIFF): 3.9 10*3/uL (ref 1.5–6.5)
LYMPH: 90 % — AB (ref 14–48)
MONO: 2 % (ref 0–13)
PLATELET MORPHOLOGY: NORMAL
PLT EST ~~LOC~~: ADEQUATE
SEG: 8 % — ABNORMAL LOW (ref 40–75)

## 2013-07-06 LAB — CHCC SATELLITE - SMEAR

## 2013-07-06 NOTE — Progress Notes (Signed)
Hematology and Oncology Follow Up Visit  LASZLO ELLERBY [id number] 1950-06-01 63 y.o. 07/06/2013   Principle Diagnosis:   CLL-stage A  Current Therapy:    Observation     Interim History:  Mr.  Ruddy is back for followup. We see him every 6 months. As always, he's been incredibly active. He and his wife have intracolic. There were skiing out in [place].  His bike riding. [place].  He's had no fatigue or weakness. He's had no swollen glands. He's had no problems infections. Has been no rashes. He's had no weight loss. He's had no tendon bowel or bladder habits. He's had no cough.  Medications: Current outpatient prescriptions:aspirin 81 MG EC tablet, Take 81 mg by mouth daily.  , Disp: , Rfl: ;  cholecalciferol (VITAMIN D) 1000 UNITS tablet, Take 1,000 Units by mouth daily. 2 tablets daily, Disp: , Rfl: ;  Coenzyme Q10 (CO Q 10 PO), Take by mouth every morning. , Disp: , Rfl: ;  CRESTOR 40 MG tablet, Take 1 tablet (40 mg total) by mouth daily., Disp: 90 tablet, Rfl: 3;  GLUCOSAMINE HCL PO, Take by mouth every morning. , Disp: , Rfl:  Green Tea, Camillia sinensis, (GREEN TEA PO), Take by mouth every morning. , Disp: , Rfl: ;  Multiple Vitamin (MULTI-VITAMIN PO), Take by mouth every morning. , Disp: , Rfl: ;  NEXIUM 40 MG capsule, Take 1 capsule (40 mg total) by mouth daily., Disp: 90 capsule, Rfl: 3;  NIACIN CR PO, Take 500 mg by mouth daily. , Disp: , Rfl: ;  Omega-3 Fatty Acids (FISH OIL CONCENTRATE PO), Take by mouth every morning. , Disp: , Rfl:  Zinc 30 MG TABS, Take by mouth every morning., Disp: , Rfl:   Allergies: No Known Allergies  Past Medical History, Surgical history, Social history, and Family History were reviewed and updated.  Review of Systems: As above  Physical Exam:  height is _0  (1.702 m) and weight is 158 lb (71.668 kg). His oral temperature is 97.9 F (36.6 C). His blood pressure is 141/89 and his pulse is 56. His  respiration is 18.   Well-developed well-nourished gentleman. Head and neck exam shows no adenopathy in the neck. Thyroid is not palpable. No oral lesions are noted. Lungs are clear. Cardiac exam regular rate rhythm with no murmurs rubs or bruits. Abdomen is soft. Has good bowel sounds. Is no fluid wave. There is no palpable liver or spleen tip. Axillary exam shows no bilateral axillary adenopathy. Back exam no tenderness over the spine ribs or hips. Extremities shows no clubbing cyanosis or edema. Has good strength in his extremities. Has good range of motion of his joints. Skin exam no rashes ecchymosis or petechia. Neurological exam is nonfocal.  Lab Results  Component Value Date   WBC 48.5* 07/06/2013   HGB 14.3 07/06/2013   HCT 43.1 07/06/2013   MCV 93 07/06/2013   PLT 215 07/06/2013     Chemistry      Component Value Date/Time   NA 140 04/21/2013 0909   K 4.4 04/21/2013 0909   CL 100 04/21/2013 0909   CO2 28 04/21/2013 0909   BUN 11 04/21/2013 0909   CREATININE 0.67 04/21/2013 0909   CREATININE 0.78 08/04/2012 0819      Component Value Date/Time   CALCIUM 9.8 04/21/2013 0909   ALKPHOS 62 04/21/2013 0909   AST 36 04/21/2013 0909   ALT 29 04/21/2013 0909  BILITOT 0.9 04/21/2013 9242         Impression and Plan: Mr. Erazo is a 63 year old gentleman. He has CLL. We have been following him now for about 6 years. His disease has been progressing very slowly. Is absent no indication for any intervention right now.  We will plan to get him back in 8 months now. I think this is reasonable.  Again, I don't see any indication for therapy. I don't see any indication for doing a bone marrow biopsy or other study on him.   Volanda Napoleon, MD 4/15/201511:58 AM

## 2013-07-07 ENCOUNTER — Encounter: Payer: Self-pay | Admitting: Internal Medicine

## 2013-08-04 ENCOUNTER — Encounter: Payer: Self-pay | Admitting: Physician Assistant

## 2013-08-04 ENCOUNTER — Ambulatory Visit (INDEPENDENT_AMBULATORY_CARE_PROVIDER_SITE_OTHER): Payer: 59 | Admitting: Physician Assistant

## 2013-08-04 VITALS — BP 136/82 | HR 56 | Temp 97.9°F | Resp 16 | Ht 67.0 in | Wt 157.0 lb

## 2013-08-04 DIAGNOSIS — W57XXXA Bitten or stung by nonvenomous insect and other nonvenomous arthropods, initial encounter: Secondary | ICD-10-CM | POA: Insufficient documentation

## 2013-08-04 DIAGNOSIS — L0390 Cellulitis, unspecified: Secondary | ICD-10-CM | POA: Insufficient documentation

## 2013-08-04 DIAGNOSIS — T148 Other injury of unspecified body region: Secondary | ICD-10-CM

## 2013-08-04 DIAGNOSIS — L0291 Cutaneous abscess, unspecified: Secondary | ICD-10-CM

## 2013-08-04 MED ORDER — DOXYCYCLINE HYCLATE 100 MG PO TABS: 100.0000 mg | ORAL_TABLET | Freq: Two times a day (BID) | ORAL | Status: AC

## 2013-08-04 NOTE — Patient Instructions (Signed)
Keep area clean and dry.  Apply cool compresses.  Claritin, cortisone cream and/or Sarna lotion to help with itch.  If symptoms do not continue to improve, please take antibiotic as directed.  Tick Bite Information Ticks are insects that attach themselves to the skin and draw blood for food. There are various types of ticks. Common types include wood ticks and deer ticks. Most ticks live in shrubs and grassy areas. Ticks can climb onto your body when you make contact with leaves or grass where the tick is waiting. The most common places on the body for ticks to attach themselves are the scalp, neck, armpits, waist, and groin. Most tick bites are harmless, but sometimes ticks carry germs that cause diseases. These germs can be spread to a person during the tick's feeding process. The chance of a disease spreading through a tick bite depends on:   The type of tick.  Time of year.   How long the tick is attached.   Geographic location.  HOW CAN YOU PREVENT TICK BITES? Take these steps to help prevent tick bites when you are outdoors:  Wear protective clothing. Long sleeves and long pants are best.   Wear white clothes so you can see ticks more easily.  Tuck your pant legs into your socks.   If walking on a trail, stay in the middle of the trail to avoid brushing against bushes.  Avoid walking through areas with long grass.  Put insect repellent on all exposed skin and along boot tops, pant legs, and sleeve cuffs.   Check clothing, hair, and skin repeatedly and before going inside.   Brush off any ticks that are not attached.  Take a shower or bath as soon as possible after being outdoors.  WHAT IS THE PROPER WAY TO REMOVE A TICK? Ticks should be removed as soon as possible to help prevent diseases caused by tick bites. 1. If latex gloves are available, put them on before trying to remove a tick.  2. Using fine-point tweezers, grasp the tick as close to the skin as  possible. You may also use curved forceps or a tick removal tool. Grasp the tick as close to its head as possible. Avoid grasping the tick on its body. 3. Pull gently with steady upward pressure until the tick lets go. Do not twist the tick or jerk it suddenly. This may break off the tick's head or mouth parts. 4. Do not squeeze or crush the tick's body. This could force disease-carrying fluids from the tick into your body.  5. After the tick is removed, wash the bite area and your hands with soap and water or other disinfectant such as alcohol. 6. Apply a small amount of antiseptic cream or ointment to the bite site.  7. Wash and disinfect any instruments that were used.  Do not try to remove a tick by applying a hot match, petroleum jelly, or fingernail polish to the tick. These methods do not work and may increase the chances of disease being spread from the tick bite.  WHEN SHOULD YOU SEEK MEDICAL CARE? Contact your health care provider if you are unable to remove a tick from your skin or if a part of the tick breaks off and is stuck in the skin.  After a tick bite, you need to be aware of signs and symptoms that could be related to diseases spread by ticks. Contact your health care provider if you develop any of the following in the days  or weeks after the tick bite:  Unexplained fever.  Rash. A circular rash that appears days or weeks after the tick bite may indicate the possibility of Lyme disease. The rash may resemble a target with a bull's-eye and may occur at a different part of your body than the tick bite.  Redness and swelling in the area of the tick bite.   Tender, swollen lymph glands.   Diarrhea.   Weight loss.   Cough.   Fatigue.   Muscle, joint, or bone pain.   Abdominal pain.   Headache.   Lethargy or a change in your level of consciousness.  Difficulty walking or moving your legs.   Numbness in the legs.   Paralysis.  Shortness of breath.    Confusion.   Repeated vomiting.  Document Released: 03/07/2000 Document Revised: 12/29/2012 Document Reviewed: 08/18/2012 [facility] Patient Information 2014 [place].

## 2013-08-04 NOTE — Assessment & Plan Note (Signed)
Low suspicion for rickettsial infection. Patient with localized cellulitis.  No rash, arthralgias, malaise or neurological complaints. Rx Doxycycline given for cellulitis. Patient given handout on tick-borne illnesses.  Patient knows when to call or return to clinic.

## 2013-08-04 NOTE — Progress Notes (Signed)
Pre visit review using our clinic review tool, if applicable. No additional management support is needed unless otherwise documented below in the visit note/SLS  

## 2013-08-04 NOTE — Assessment & Plan Note (Addendum)
Improving.  Patient to continue conservative measures. Rx doxycycline to start taking if symptoms are not continuing to improve.

## 2013-08-04 NOTE — Progress Notes (Signed)
Patient presents to clinic today c/o tick bite of right medial axillary region x 3 days.  Patient endorses successful removal of tick.  Has noticed localized erythema, swelling and warmth.  Denies fever, chills, muscle/joint aches, bulls-eye rash, rash of palms/soles.  Patient states redness is improving.  Has been applying cold compresses to area.  Past Medical History  Diagnosis Date  . High cholesterol   . GERD (gastroesophageal reflux disease)   . History of chicken pox     childhood  . Colon polyp   . CLL (chronic lymphocytic leukemia)     Current Outpatient Prescriptions on File Prior to Visit  Medication Sig Dispense Refill  . aspirin 81 MG EC tablet Take 81 mg by mouth daily.        . cholecalciferol (VITAMIN D) 1000 UNITS tablet Take 1,000 Units by mouth daily. 2 tablets daily      . Coenzyme Q10 (CO Q 10 PO) Take by mouth every morning.       Marland Kitchen CRESTOR 40 MG tablet Take 1 tablet (40 mg total) by mouth daily.  90 tablet  3  . GLUCOSAMINE HCL PO Take by mouth every morning.       Nyoka Cowden Tea, Camillia sinensis, (GREEN TEA PO) Take by mouth every morning.       . Multiple Vitamin (MULTI-VITAMIN PO) Take by mouth every morning.       Marland Kitchen NEXIUM 40 MG capsule Take 1 capsule (40 mg total) by mouth daily.  90 capsule  3  . NIACIN CR PO Take 500 mg by mouth daily.       . Omega-3 Fatty Acids (FISH OIL CONCENTRATE PO) Take by mouth every morning.       . Zinc 30 MG TABS Take by mouth every morning.       No current facility-administered medications on file prior to visit.    No Known Allergies  Family History  Problem Relation Age of Onset  . Hyperlipidemia Mother   . Hypertension Mother     mother-father  . Prostate cancer Neg Hx   . Breast cancer Neg Hx   . Colon cancer Neg Hx   . Diabetes      mother -father  . Heart disease Neg Hx   . Pulmonary fibrosis Mother     History   Social History  . Marital Status: Single    Spouse Name: N/A    Number of Children: N/A  .  Years of Education: N/A   Social History Main Topics  . Smoking status: Former Smoker -- 1.00 packs/day for 22 years    Types: Cigarettes    Start date: 02/05/1970    Quit date: 03/07/1992  . Smokeless tobacco: Never Used     Comment: quit 21 years ago  . Alcohol Use: Yes     Comment: social  . Drug Use: None  . Sexual Activity: None   Other Topics Concern  . None   Social History Narrative  . None   Review of Systems - See HPI.  All other ROS are negative.  BP 136/82  Pulse 56  Temp(Src) 97.9 F (36.6 C) (Oral)  Resp 16  Ht 5\' 7"  (1.702 m)  Wt 157 lb (71.215 kg)  BMI 24.58 kg/m2  SpO2 98%  Physical Exam  Vitals reviewed. Constitutional: He is oriented to person, place, and time and well-developed, well-nourished, and in no distress.  HENT:  Head: Normocephalic and atraumatic.  Eyes: Conjunctivae are normal. Pupils  are equal, round, and reactive to light.  Neck: Neck supple.  Cardiovascular: Normal rate, regular rhythm, normal heart sounds and intact distal pulses.   Pulmonary/Chest: Effort normal and breath sounds normal. No respiratory distress. He has no wheezes. He has no rales. He exhibits no tenderness.  Lymphadenopathy:    He has no cervical adenopathy.  Neurological: He is alert and oriented to person, place, and time.  Skin: Skin is warm and dry. No rash noted.  2-3 cm area of erythema with slight induration at medial aspect of right axilla, without fluctuation or drainage.  Consistent with cellulitis. No rash noted elsewhere on extremities, torso, palms, soles.  Psychiatric: Affect normal.   Recent Results (from the past 2160 hour(s))  CBC WITH DIFFERENTIAL ([facility])     Status: Abnormal   Collection Time    07/06/13 10:18 AM      Result Value Ref Range   WBC 48.5 (*) 4.0 - 10.0 10e3/uL   RBC 4.62  4.20 - 5.70 10e6/uL   HGB 14.3  13.0 - 17.1 g/dL   HCT 43.1  38.7 - 49.9 %   MCV 93  82 - 98 fL   MCH 31.0  28.0 - 33.4 pg   MCHC 33.2  32.0 -  35.9 g/dL   RDW 14.1  11.1 - 15.7 %   Platelets 215  145 - 400 10e3/uL  [facility] - SMEAR     Status: None   Collection Time    07/06/13 10:18 AM      Result Value Ref Range   Smear Result Smear Available    MANUAL DIFFERENTIAL ([facility])     Status: Abnormal   Collection Time    07/06/13 10:18 AM      Result Value Ref Range   ANC ([facility] manual diff) 3.9  1.5 - 6.5 10e3/uL   ALC 43.6 (*) 0.9 - 3.3 10e3/uL   SEG 8 (*) 40 - 75 %   LYMPH 90 (*) 14 - 48 %   MONO 2  0 - 13 %   Ovalocyte Few  Negative   Smudge Cells Few  Negative   WBC Comment Variant Lymphs     PLT EST [place] Adequate  Adequate   Platelet Morphology Within Normal Limits  Within Normal Limits   Assessment/Plan: Cellulitis Improving.  Patient to continue conservative measures. Rx doxycycline to start taking if symptoms are not continuing to improve.  Tick bite Low suspicion for rickettsial infection. Patient with localized cellulitis.  No rash, arthralgias, malaise or neurological complaints. Rx Doxycycline given for cellulitis. Patient given handout on tick-borne illnesses.  Patient knows when to call or return to clinic.

## 2013-08-23 ENCOUNTER — Ambulatory Visit (AMBULATORY_SURGERY_CENTER): Payer: Self-pay

## 2013-08-23 VITALS — Ht 68.5 in | Wt 155.0 lb

## 2013-08-23 DIAGNOSIS — Z86010 Personal history of colonic polyps: Secondary | ICD-10-CM

## 2013-08-23 MED ORDER — MOVIPREP 100 G PO SOLR: ORAL | Status: DC

## 2013-08-23 NOTE — Progress Notes (Signed)
Pt came into the office today for his pre-visit prior to his colonscopy with Dr Olevia Perches on 09/07/13.He states he had a colonoscopy done over 5 years ago at [facility]. The pt signed a medical release form which I will give to [facility].The pt was informed his colonoscopy could be cancelled if we do not receive records. He understood     Per pt, no allergies to soy or egg products.Pt not taking any weight loss meds or using  O2 at home.

## 2013-08-25 ENCOUNTER — Other Ambulatory Visit: Payer: Self-pay | Admitting: Family Medicine

## 2013-08-25 ENCOUNTER — Telehealth: Payer: Self-pay

## 2013-08-25 DIAGNOSIS — K219 Gastro-esophageal reflux disease without esophagitis: Secondary | ICD-10-CM

## 2013-08-25 MED ORDER — ESOMEPRAZOLE MAGNESIUM 40 MG PO CPDR: 40.0000 mg | DELAYED_RELEASE_CAPSULE | Freq: Every day | ORAL | Status: DC

## 2013-08-25 NOTE — Telephone Encounter (Signed)
George [name] with the pharmacy states that [facility] is marked on pts RX and pt would like to go to generic?   Please advise if this is ok

## 2013-08-25 NOTE — Telephone Encounter (Signed)
Already a note started on this. Waiting on mds answer

## 2013-08-25 NOTE — Telephone Encounter (Signed)
OK to switch to generic, same strength, same sig

## 2013-09-07 ENCOUNTER — Encounter: Payer: Self-pay | Admitting: Internal Medicine

## 2013-09-07 ENCOUNTER — Ambulatory Visit (AMBULATORY_SURGERY_CENTER): Payer: 59 | Admitting: Internal Medicine

## 2013-09-07 VITALS — BP 127/87 | HR 48 | Temp 98.0°F | Resp 16 | Ht 68.0 in | Wt 155.0 lb

## 2013-09-07 DIAGNOSIS — D126 Benign neoplasm of colon, unspecified: Secondary | ICD-10-CM

## 2013-09-07 DIAGNOSIS — Z86010 Personal history of colonic polyps: Secondary | ICD-10-CM

## 2013-09-07 LAB — HM COLONOSCOPY

## 2013-09-07 MED ORDER — SODIUM CHLORIDE 0.9 % IV SOLN: 500.0000 mL | INTRAVENOUS | Status: DC

## 2013-09-07 NOTE — Progress Notes (Signed)
Report to PACU, RN, vss, BBS= Clear.  

## 2013-09-07 NOTE — Progress Notes (Signed)
Called to room to assist during endoscopic procedure.  Patient ID and intended procedure confirmed with present staff. Received instructions for my participation in the procedure from the performing physician.  

## 2013-09-07 NOTE — Patient Instructions (Signed)
YOU HAD AN ENDOSCOPIC PROCEDURE TODAY AT THE [facility]: Refer to the procedure report that was given to you for any specific questions about what was found during the examination.  If the procedure report does not answer your questions, please call your gastroenterologist to clarify.  If you requested that your care partner not be given the details of your procedure findings, then the procedure report has been included in a sealed envelope for you to review at your convenience later.  YOU SHOULD EXPECT: Some feelings of bloating in the abdomen. Passage of more gas than usual.  Walking can help get rid of the air that was put into your GI tract during the procedure and reduce the bloating. If you had a lower endoscopy (such as a colonoscopy or flexible sigmoidoscopy) you may notice spotting of blood in your stool or on the toilet paper. If you underwent a bowel prep for your procedure, then you may not have a normal bowel movement for a few days.  DIET: Your first meal following the procedure should be a light meal and then it is ok to progress to your normal diet.  A half-sandwich or bowl of soup is an example of a good first meal.  Heavy or fried foods are harder to digest and may make you feel nauseous or bloated.  Likewise meals heavy in dairy and vegetables can cause extra gas to form and this can also increase the bloating.  Drink plenty of fluids but you should avoid alcoholic beverages for 24 hours.  ACTIVITY: Your care partner should take you home directly after the procedure.  You should plan to take it easy, moving slowly for the rest of the day.  You can resume normal activity the day after the procedure however you should NOT DRIVE or use heavy machinery for 24 hours (because of the sedation medicines used during the test).    SYMPTOMS TO REPORT IMMEDIATELY: A gastroenterologist can be reached at any hour.  During normal business hours, 8:30 AM to 5:00 PM Monday through Friday,  call [phone].  After hours and on weekends, please call the GI answering service at [phone] who will take a message and have the physician on call contact you.   Following lower endoscopy (colonoscopy or flexible sigmoidoscopy):  Excessive amounts of blood in the stool  Significant tenderness or worsening of abdominal pains  Swelling of the abdomen that is new, acute  Fever of 100F or higher    FOLLOW UP: If any biopsies were taken you will be contacted by phone or by letter within the next 1-3 weeks.  Call your gastroenterologist if you have not heard about the biopsies in 3 weeks.  Our staff will call the home number listed on your records the next business day following your procedure to check on you and address any questions or concerns that you may have at that time regarding the information given to you following your procedure. This is a courtesy call and so if there is no answer at the home number and we have not heard from you through the emergency physician on call, we will assume that you have returned to your regular daily activities without incident.  SIGNATURES/CONFIDENTIALITY: You and/or your care partner have signed paperwork which will be entered into your electronic medical record.  These signatures attest to the fact that that the information above on your After Visit Summary has been reviewed and is understood.  Full responsibility of the confidentiality   of this discharge information lies with you and/or your care-partner.   Polyp, diverticulosis, high fiber diet information given.  Recall will be dependent upon pathology report, Dr. Olevia Perches will let you know in a letter.

## 2013-09-07 NOTE — Op Note (Signed)
[facility]  [address] [place], [address]   COLONOSCOPY PROCEDURE REPORT  PATIENT: George [name], George [name]  MR#: [id number] BIRTHDATE: 07/30/1950 , 89  yrs. old GENDER: Male ENDOSCOPIST: Lafayette Dragon, MD REFERRED ZE:[id number] Charlett Blake, M.D. PROCEDURE DATE:  09/07/2013 PROCEDURE:   Colonoscopy, screening First Screening Colonoscopy - Avg.  risk and is 50 yrs.  old or older - No.  Prior Negative Screening - Now for repeat screening. N/A  History of Adenoma - Now for follow-up colonoscopy & has been > or = to 3 yrs.  Yes hx of adenoma.  Has been 3 or more years since last colonoscopy.  Polyps Removed Today? Yes. ASA CLASS:   Class I INDICATIONS:Prior colonoscopy 2005.  Hyperplastic and adenomatous polyp removed.  Most recent colonoscopy March 2010 adenomatous polyp removed. MEDICATIONS: MAC sedation, administered by CRNA and Propofol (Diprivan) 340 mg IV  DESCRIPTION OF PROCEDURE:   After the risks benefits and alternatives of the procedure were thoroughly explained, informed consent was obtained.  A digital rectal exam revealed no abnormalities of the rectum.   The LB [id number]  endoscope was introduced through the anus and advanced to the cecum, which was identified by both the appendix and ileocecal valve. No adverse events experienced.   The quality of the prep was good, using MoviPrep  The instrument was then slowly withdrawn as the colon was fully examined.      COLON FINDINGS: A sessile polyp ranging between 3-45mm in size was found in the ascending colon.  A polypectomy was performed with cold forceps.  The resection was complete and the polyp tissue was completely retrieved.   Moderate diverticulosis was noted in the sigmoid colon.  Retroflexed views revealed no abnormalities. The time to cecum=8 minutes 29 seconds.  Withdrawal time=6 minutes 05 seconds.  The scope was withdrawn and the procedure completed. COMPLICATIONS: There were no  complications.  ENDOSCOPIC IMPRESSION: 1.   Sessile polyp ranging between 3-11mm in size was found in the ascending colon; polypectomy was performed with cold forceps 2.   Moderate diverticulosis was noted in the sigmoid colon  RECOMMENDATIONS: 1.  Await pathology results 2.  high fiber diet Recall colonoscopy pending path report   eSigned:  Lafayette Dragon, MD 09/07/2013 9:40 AM   cc:   PATIENT NAME:  [name], [name] MR#: [id number]

## 2013-09-08 ENCOUNTER — Telehealth: Payer: Self-pay | Admitting: *Deleted

## 2013-09-08 NOTE — Telephone Encounter (Signed)
  Follow up Call-  Call back number 09/07/2013  Post procedure Call Back phone  # [phone]  Permission to leave phone message Yes     Patient questions:  Do you have a fever, pain , or abdominal swelling? no Pain Score  0 *  Have you tolerated food without any problems? yes  Have you been able to return to your normal activities? yes  Do you have any questions about your discharge instructions: Diet   no Medications  no Follow up visit  no  Do you have questions or concerns about your Care? no  Actions: * If pain score is 4 or above: No action needed, pain <4.

## 2013-09-14 ENCOUNTER — Encounter: Payer: Self-pay | Admitting: Gastroenterology

## 2013-09-14 ENCOUNTER — Encounter: Payer: Self-pay | Admitting: Internal Medicine

## 2013-10-03 LAB — TSH: TSH: 0.7 u[IU]/mL (ref 0.350–4.500)

## 2013-10-03 LAB — HEPATIC FUNCTION PANEL
ALT: 21 U/L (ref 0–53)
AST: 28 U/L (ref 0–37)
Albumin: 4.5 g/dL (ref 3.5–5.2)
Alkaline Phosphatase: 54 U/L (ref 39–117)
Bilirubin, Direct: 0.1 mg/dL (ref 0.0–0.3)
Indirect Bilirubin: 0.6 mg/dL (ref 0.2–1.2)
Total Bilirubin: 0.7 mg/dL (ref 0.2–1.2)
Total Protein: 6.8 g/dL (ref 6.0–8.3)

## 2013-10-03 LAB — LIPID PANEL
CHOLESTEROL: 196 mg/dL (ref 0–200)
HDL: 73 mg/dL (ref >39)
LDL Cholesterol: 102 mg/dL — ABNORMAL HIGH (ref 0–99)
Total CHOL/HDL Ratio: 2.7 Ratio
Triglycerides: 104 mg/dL (ref <150)
VLDL: 21 mg/dL (ref 0–40)

## 2013-10-03 LAB — RENAL FUNCTION PANEL
Albumin: 4.5 g/dL (ref 3.5–5.2)
BUN: 9 mg/dL (ref 6–23)
CALCIUM: 9.2 mg/dL (ref 8.4–10.5)
CO2: 29 mEq/L (ref 19–32)
CREATININE: 0.66 mg/dL (ref 0.50–1.35)
Chloride: 102 mEq/L (ref 96–112)
Glucose, Bld: 87 mg/dL (ref 70–99)
POTASSIUM: 4.5 meq/L (ref 3.5–5.3)
Phosphorus: 2.7 mg/dL (ref 2.3–4.6)
Sodium: 139 mEq/L (ref 135–145)

## 2013-10-03 LAB — CBC
HCT: 41.8 % (ref 39.0–52.0)
Hemoglobin: 13.9 g/dL (ref 13.0–17.0)
MCH: 30.5 pg (ref 26.0–34.0)
MCHC: 33.3 g/dL (ref 30.0–36.0)
MCV: 91.9 fL (ref 78.0–100.0)
Platelets: 185 K/uL (ref 150–400)
RBC: 4.55 MIL/uL (ref 4.22–5.81)
RDW: 13.3 % (ref 11.5–15.5)
WBC: 42.4 K/uL — ABNORMAL HIGH (ref 4.0–10.5)

## 2013-10-06 ENCOUNTER — Encounter: Payer: Self-pay | Admitting: Gastroenterology

## 2013-10-10 ENCOUNTER — Encounter: Payer: 59 | Admitting: Family Medicine

## 2013-10-17 ENCOUNTER — Encounter: Payer: Self-pay | Admitting: Family Medicine

## 2013-10-17 ENCOUNTER — Ambulatory Visit (INDEPENDENT_AMBULATORY_CARE_PROVIDER_SITE_OTHER): Payer: 59 | Admitting: Family Medicine

## 2013-10-17 VITALS — BP 132/80 | HR 57 | Temp 98.2°F | Ht 67.0 in | Wt 156.0 lb

## 2013-10-17 DIAGNOSIS — K219 Gastro-esophageal reflux disease without esophagitis: Secondary | ICD-10-CM

## 2013-10-17 DIAGNOSIS — E785 Hyperlipidemia, unspecified: Secondary | ICD-10-CM

## 2013-10-17 DIAGNOSIS — Z0000 Encounter for general adult medical examination without abnormal findings: Secondary | ICD-10-CM

## 2013-10-17 DIAGNOSIS — C9110 Chronic lymphocytic leukemia of B-cell type not having achieved remission: Secondary | ICD-10-CM

## 2013-10-17 NOTE — Patient Instructions (Addendum)
Add PSA to next labs   Start a probiotic such as Digestive Advantage or Phillip's Colon Health daily Drop Nexium to every other day as tolerated and/or try zantac imbetween   Gastroesophageal Reflux Disease, Adult Gastroesophageal reflux disease (GERD) happens when acid from your stomach flows up into the esophagus. When acid comes in contact with the esophagus, the acid causes soreness (inflammation) in the esophagus. Over time, GERD may create small holes (ulcers) in the lining of the esophagus. CAUSES   Increased body weight. This puts pressure on the stomach, making acid rise from the stomach into the esophagus.  Smoking. This increases acid production in the stomach.  Drinking alcohol. This causes decreased pressure in the lower esophageal sphincter (valve or ring of muscle between the esophagus and stomach), allowing acid from the stomach into the esophagus.  Late evening meals and a full stomach. This increases pressure and acid production in the stomach.  A malformed lower esophageal sphincter. Sometimes, no cause is found. SYMPTOMS   Burning pain in the lower part of the mid-chest behind the breastbone and in the mid-stomach area. This may occur twice a week or more often.  Trouble swallowing.  Sore throat.  Dry cough.  Asthma-like symptoms including chest tightness, shortness of breath, or wheezing. DIAGNOSIS  Your caregiver may be able to diagnose GERD based on your symptoms. In some cases, X-rays and other tests may be done to check for complications or to check the condition of your stomach and esophagus. TREATMENT  Your caregiver may recommend over-the-counter or prescription medicines to help decrease acid production. Ask your caregiver before starting or adding any new medicines.  HOME CARE INSTRUCTIONS   Change the factors that you can control. Ask your caregiver for guidance concerning weight loss, quitting smoking, and alcohol consumption.  Avoid foods and  drinks that make your symptoms worse, such as:  Caffeine or alcoholic drinks.  Chocolate.  Peppermint or mint flavorings.  Garlic and onions.  Spicy foods.  Citrus fruits, such as oranges, lemons, or limes.  Tomato-based foods such as sauce, chili, salsa, and pizza.  Fried and fatty foods.  Avoid lying down for the 3 hours prior to your bedtime or prior to taking a nap.  Eat small, frequent meals instead of large meals.  Wear loose-fitting clothing. Do not wear anything tight around your waist that causes pressure on your stomach.  Raise the head of your bed 6 to 8 inches with wood blocks to help you sleep. Extra pillows will not help.  Only take over-the-counter or prescription medicines for pain, discomfort, or fever as directed by your caregiver.  Do not take aspirin, ibuprofen, or other nonsteroidal anti-inflammatory drugs (NSAIDs). SEEK IMMEDIATE MEDICAL CARE IF:   You have pain in your arms, neck, jaw, teeth, or back.  Your pain increases or changes in intensity or duration.  You develop nausea, vomiting, or sweating (diaphoresis).  You develop shortness of breath, or you faint.  Your vomit is green, yellow, black, or looks like coffee grounds or blood.  Your stool is red, bloody, or black. These symptoms could be signs of other problems, such as heart disease, gastric bleeding, or esophageal bleeding. MAKE SURE YOU:   Understand these instructions.  Will watch your condition.  Will get help right away if you are not doing well or get worse. Document Released: 12/18/2004 Document Revised: 06/02/2011 Document Reviewed: 09/27/2010 [facility] Patient Information 2015 [place], [place]. This information is not intended to replace advice given to you by  your health care provider. Make sure you discuss any questions you have with your health care provider.  

## 2013-10-17 NOTE — Progress Notes (Signed)
Pre visit review using our clinic review tool, if applicable. No additional management support is needed unless otherwise documented below in the visit note. 

## 2013-10-23 ENCOUNTER — Encounter: Payer: Self-pay | Admitting: Family Medicine

## 2013-10-23 DIAGNOSIS — Z0000 Encounter for general adult medical examination without abnormal findings: Secondary | ICD-10-CM

## 2013-10-23 HISTORY — DX: Encounter for general adult medical examination without abnormal findings: Z00.00

## 2013-10-23 NOTE — Assessment & Plan Note (Signed)
Avoid offending foods, start probiotics. Do not eat large meals in late evening and consider raising head of bed.  

## 2013-10-23 NOTE — Assessment & Plan Note (Addendum)
Patient encouraged to maintain heart healthy diet, regular exercise, adequate sleep. Consider daily probiotics. Take medications as prescribed. Fasting labs reviewed with patient today. Has completed colonoscopy recently

## 2013-10-23 NOTE — Progress Notes (Signed)
Patient ID: George [name], male   DOB: 1950/08/10, 63 y.o.   MRN: [id number] TALLIE HEVIA [id number] 1950-10-04 10/23/2013      Progress Note-Follow Up  Subjective  Chief Complaint  Chief Complaint  Patient presents with  . Annual Exam    physical    HPI  Patient is a 63 year old male in today for routine medical care. No recent illness. Is following with oncology for his CLL and is asymptomatic and stable. Denies fatigue, HA, bruising. Denies CP/palp/SOB/HA/congestion/fevers/GI or GU c/o. Taking meds as prescribed  Past Medical History  Diagnosis Date  . High cholesterol   . GERD (gastroesophageal reflux disease)   . History of chicken pox     childhood  . Colon polyp   . CLL (chronic lymphocytic leukemia) 2009  . Preventative health care 10/23/2013    Past Surgical History  Procedure Laterality Date  . Hernia repair      right groin  . Esophageal dilation  2005    [facility]  . Colonoscopy    . Polypectomy      Family History  Problem Relation Age of Onset  . Hyperlipidemia Mother   . Hypertension Mother     mother-father  . Pulmonary fibrosis Mother   . Prostate cancer Neg Hx   . Breast cancer Neg Hx   . Colon cancer Neg Hx   . Diabetes      mother -father  . Diabetes Father   . Heart disease Father     congestive heart failure  . Other Father     brain tumor  . Mental illness Sister     depression  . GER disease Son   . Stroke Maternal Grandmother   . Diabetes Maternal Grandmother     History   Social History  . Marital Status: Single    Spouse Name: N/A    Number of Children: N/A  . Years of Education: N/A   Occupational History  . Not on file.   Social History Main Topics  . Smoking status: Former Smoker -- 1.00 packs/day for 22 years    Types: Cigarettes    Start date: 02/05/1970    Quit date: 03/07/1992  . Smokeless tobacco: Never Used     Comment: quit 21 years ago  . Alcohol Use: 3.6 oz/week    6 Cans of beer per week   Comment: social  . Drug Use: No  . Sexual Activity: Yes     Comment: lives with wife, no dietary restirctions   Other Topics Concern  . Not on file   Social History Narrative  . No narrative on file    Current Outpatient Prescriptions on File Prior to Visit  Medication Sig Dispense Refill  . aspirin 81 MG EC tablet Take 81 mg by mouth daily.        . cholecalciferol (VITAMIN D) 1000 UNITS tablet Take 1,000 Units by mouth daily. 2 tablets daily      . Coenzyme Q10 (CO Q 10 PO) Take by mouth every morning.       Marland Kitchen CRESTOR 40 MG tablet Take 1 tablet (40 mg total) by mouth daily.  90 tablet  3  . esomeprazole (NEXIUM) 40 MG capsule Take 1 capsule (40 mg total) by mouth daily.  90 capsule  1  . GLUCOSAMINE HCL PO Take by mouth every morning.       Nyoka Cowden Tea, Camillia sinensis, (GREEN TEA PO) Take 315 mg by mouth every  morning.       . Multiple Vitamin (MULTI-VITAMIN PO) Take by mouth every morning.       Marland Kitchen NIACIN CR PO Take 500 mg by mouth daily.       . Omega-3 Fatty Acids (FISH OIL CONCENTRATE PO) Take by mouth every morning.       . Zinc 30 MG TABS Take by mouth every morning.       No current facility-administered medications on file prior to visit.    No Known Allergies  Review of Systems  Review of Systems  Constitutional: Negative for fever, chills and malaise/fatigue.  HENT: Negative for congestion, hearing loss and nosebleeds.   Eyes: Negative for discharge.  Respiratory: Negative for cough, sputum production, shortness of breath and wheezing.   Cardiovascular: Negative for chest pain, palpitations and leg swelling.  Gastrointestinal: Negative for heartburn, nausea, vomiting, abdominal pain, diarrhea, constipation and blood in stool.  Genitourinary: Negative for dysuria, urgency, frequency and hematuria.  Musculoskeletal: Negative for back pain, falls and myalgias.  Skin: Negative for rash.  Neurological: Negative for dizziness, tremors, sensory change, focal  weakness, loss of consciousness, weakness and headaches.  Endo/Heme/Allergies: Negative for polydipsia. Does not bruise/bleed easily.  Psychiatric/Behavioral: Negative for depression and suicidal ideas. The patient is not nervous/anxious and does not have insomnia.     Objective  BP 132/80  Pulse 57  Temp(Src) 98.2 F (36.8 C) (Oral)  Ht 5\' 7"  (1.702 m)  Wt 156 lb 0.6 oz (70.779 kg)  BMI 24.43 kg/m2  SpO2 97%  Physical Exam  Physical Exam  Constitutional: He is oriented to person, place, and time and well-developed, well-nourished, and in no distress. No distress.  HENT:  Head: Normocephalic and atraumatic.  Eyes: Conjunctivae are normal.  Neck: Neck supple. No thyromegaly present.  Cardiovascular: Normal rate, regular rhythm and normal heart sounds.   No murmur heard. Pulmonary/Chest: Effort normal and breath sounds normal. No respiratory distress.  Abdominal: He exhibits no distension and no mass. There is no tenderness.  Musculoskeletal: He exhibits no edema.  Neurological: He is alert and oriented to person, place, and time.  Skin: Skin is warm.  Psychiatric: Memory, affect and judgment normal.    Lab Results  Component Value Date   TSH 0.700 10/03/2013   Lab Results  Component Value Date   WBC 42.4* 10/03/2013   HGB 13.9 10/03/2013   HCT 41.8 10/03/2013   MCV 91.9 10/03/2013   PLT 185 10/03/2013   Lab Results  Component Value Date   CREATININE 0.66 10/03/2013   BUN 9 10/03/2013   NA 139 10/03/2013   K 4.5 10/03/2013   CL 102 10/03/2013   CO2 29 10/03/2013   Lab Results  Component Value Date   ALT 21 10/03/2013   AST 28 10/03/2013   ALKPHOS 54 10/03/2013   BILITOT 0.7 10/03/2013   Lab Results  Component Value Date   CHOL 196 10/03/2013   Lab Results  Component Value Date   HDL 73 10/03/2013   Lab Results  Component Value Date   LDLCALC 102* 10/03/2013   Lab Results  Component Value Date   TRIG 104 10/03/2013   Lab Results  Component Value Date   CHOLHDL  2.7 10/03/2013     Assessment & Plan  CLL (chronic lymphocytic leukemia) Following with oncology, asymptomatic, stable  GERD (gastroesophageal reflux disease) Avoid offending foods, start probiotics. Do not eat large meals in late evening and consider raising head of bed.   Other and  unspecified hyperlipidemia Mild, Tolerating statin, encouraged heart healthy diet, avoid trans fats, minimize simple carbs and saturated fats. Increase exercise as tolerated  Preventative health care Patient encouraged to maintain heart healthy diet, regular exercise, adequate sleep. Consider daily probiotics. Take medications as prescribed. Fasting labs reviewed with patient today. Has completed colonoscopy recently

## 2013-10-23 NOTE — Assessment & Plan Note (Addendum)
Mild, Tolerating statin, encouraged heart healthy diet, avoid trans fats, minimize simple carbs and saturated fats. Increase exercise as tolerated

## 2013-10-23 NOTE — Assessment & Plan Note (Signed)
Following with oncology, asymptomatic, stable

## 2013-12-27 ENCOUNTER — Encounter: Payer: Self-pay | Admitting: Family Medicine

## 2014-03-08 ENCOUNTER — Other Ambulatory Visit (HOSPITAL_BASED_OUTPATIENT_CLINIC_OR_DEPARTMENT_OTHER): Payer: 59 | Admitting: Lab

## 2014-03-08 ENCOUNTER — Ambulatory Visit (HOSPITAL_BASED_OUTPATIENT_CLINIC_OR_DEPARTMENT_OTHER): Payer: 59 | Admitting: Hematology & Oncology

## 2014-03-08 ENCOUNTER — Encounter: Payer: Self-pay | Admitting: Hematology & Oncology

## 2014-03-08 VITALS — BP 156/90 | HR 64 | Temp 97.9°F | Resp 64 | Ht 67.0 in | Wt 160.0 lb

## 2014-03-08 DIAGNOSIS — C9110 Chronic lymphocytic leukemia of B-cell type not having achieved remission: Secondary | ICD-10-CM

## 2014-03-08 LAB — MANUAL DIFFERENTIAL (CHCC SATELLITE)
ALC: 41.5 10*3/uL — ABNORMAL HIGH (ref 0.9–3.3)
ANC (CHCC HP manual diff): 5.8 10*3/uL (ref 1.5–6.5)
LYMPH: 86 % — AB (ref 14–48)
MONO: 2 % (ref 0–13)
PLT EST ~~LOC~~: ADEQUATE
Platelet Morphology: NORMAL
SEG: 12 % — AB (ref 40–75)

## 2014-03-08 LAB — CHCC SATELLITE - SMEAR

## 2014-03-08 LAB — CBC WITH DIFFERENTIAL (CANCER CENTER ONLY)
HCT: 41.6 % (ref 38.7–49.9)
HGB: 13.9 g/dL (ref 13.0–17.1)
MCH: 30.8 pg (ref 28.0–33.4)
MCHC: 33.4 g/dL (ref 32.0–35.9)
MCV: 92 fL (ref 82–98)
Platelets: 208 10*3/uL (ref 145–400)
RBC: 4.51 10*6/uL (ref 4.20–5.70)
RDW: 13.3 % (ref 11.1–15.7)
WBC: 48.2 10*3/uL — AB (ref 4.0–10.0)

## 2014-03-08 NOTE — Progress Notes (Signed)
Hematology and Oncology Follow Up Visit  George [name] [id number] 1950/08/25 63 y.o. 03/08/2014   Principle Diagnosis:   CLL-stage A  Current Therapy:    Observation     Interim History:  Mr.  George [name] is back for followup. We saw him 8 months ago. As always, he's been incredibly active. He and his wife have have been traveling. They went out to [place] for part of the summer. They will be going out to [place] this winter for skiing  He is bike riding. Again, is very active.   He's had no fatigue or weakness. He's had no swollen glands. He's had no problems with infections. Has been no rashes. He's had no weight loss. He's had no change in bowel or bladder habits. He's had no cough.  Medications: Current outpatient prescriptions: aspirin 81 MG EC tablet, Take 81 mg by mouth daily.  , Disp: , Rfl: ;  cholecalciferol (VITAMIN D) 1000 UNITS tablet, Take 1,000 Units by mouth daily. 2 tablets daily, Disp: , Rfl: ;  Coenzyme Q10 (CO Q 10 PO), Take by mouth every morning. , Disp: , Rfl: ;  CRESTOR 40 MG tablet, Take 1 tablet (40 mg total) by mouth daily., Disp: 90 tablet, Rfl: 3 esomeprazole (NEXIUM) 40 MG capsule, Take 40 mg by mouth every other day., Disp: , Rfl: ;  GLUCOSAMINE HCL PO, Take by mouth every morning. , Disp: , Rfl: ;  Green Tea, Camillia sinensis, (GREEN TEA PO), Take 315 mg by mouth every morning. , Disp: , Rfl: ;  lactobacillus acidophilus (BACID) TABS tablet, Take 2 tablets by mouth daily., Disp: , Rfl: ;  Multiple Vitamin (MULTI-VITAMIN PO), Take by mouth every morning. , Disp: , Rfl:  NIACIN CR PO, Take 500 mg by mouth daily. , Disp: , Rfl: ;  Omega-3 Fatty Acids (FISH OIL CONCENTRATE PO), Take by mouth every morning. , Disp: , Rfl: ;  psyllium (METAMUCIL) 58.6 % powder, Take 1 packet by mouth daily., Disp: , Rfl: ;  Zinc 30 MG TABS, Take by mouth every morning., Disp: , Rfl:   Allergies: No Known Allergies  Past Medical History, Surgical history, Social history, and Family  History were reviewed and updated.  Review of Systems: As above  Physical Exam:  height is _0  (1.702 m) and weight is 160 lb (72.576 kg). His oral temperature is 97.9 F (36.6 C). His blood pressure is 156/90 and his pulse is 64. His respiration is 64.   Well-developed well-nourished gentleman. Head and neck exam shows no adenopathy in the neck. Thyroid is not palpable. No oral lesions are noted. Lungs are clear. Cardiac exam regular rate and rhythm with no murmurs rubs or bruits. Abdomen is soft. He has good bowel sounds. There is no fluid wave. There is no palpable liver or spleen tip. Axillary exam shows no bilateral axillary adenopathy. Back exam no tenderness over the spine ribs or hips. Extremities shows no clubbing cyanosis or edema. He has good strength in his extremities. He has good range of motion of his joints. Skin exam no rashes ecchymosis or petechia. Neurological exam is nonfocal.  Lab Results  Component Value Date   WBC 48.2* 03/08/2014   HGB 13.9 03/08/2014   HCT 41.6 03/08/2014   MCV 92 03/08/2014   PLT 208 03/08/2014     Chemistry      Component Value Date/Time   NA 139 10/03/2013 0800   K 4.5 10/03/2013 0800   CL 102 10/03/2013 0800   CO2  29 10/03/2013 0800   BUN 9 10/03/2013 0800   CREATININE 0.66 10/03/2013 0800   CREATININE 0.78 08/04/2012 0819      Component Value Date/Time   CALCIUM 9.2 10/03/2013 0800   ALKPHOS 54 10/03/2013 0800   AST 28 10/03/2013 0800   ALT 21 10/03/2013 0800   BILITOT 0.7 10/03/2013 0800         Impression and Plan: George [name] is a 63 year old gentleman. He has CLL. We have been following him now for about 7 years. His disease has been progressing very slowly. Currently, there no indication for any intervention right now.  We will plan to get him back in one year. I think this is reasonable.  Again, I don't see any indication for therapy. I don't see any indication for doing a bone marrow biopsy or other study on  him.   Volanda Napoleon, MD 12/16/20155:45 PM

## 2014-03-27 ENCOUNTER — Other Ambulatory Visit: Payer: Self-pay | Admitting: Family Medicine

## 2014-03-27 NOTE — Telephone Encounter (Signed)
Nexium refilled. JG//CMA

## 2014-04-17 ENCOUNTER — Ambulatory Visit (INDEPENDENT_AMBULATORY_CARE_PROVIDER_SITE_OTHER): Payer: 59 | Admitting: Family Medicine

## 2014-04-17 ENCOUNTER — Encounter: Payer: Self-pay | Admitting: Family Medicine

## 2014-04-17 VITALS — BP 122/82 | HR 71 | Temp 97.6°F | Ht 67.0 in | Wt 162.8 lb

## 2014-04-17 DIAGNOSIS — K219 Gastro-esophageal reflux disease without esophagitis: Secondary | ICD-10-CM

## 2014-04-17 DIAGNOSIS — E785 Hyperlipidemia, unspecified: Secondary | ICD-10-CM

## 2014-04-17 DIAGNOSIS — R251 Tremor, unspecified: Secondary | ICD-10-CM

## 2014-04-17 DIAGNOSIS — R030 Elevated blood-pressure reading, without diagnosis of hypertension: Secondary | ICD-10-CM

## 2014-04-17 DIAGNOSIS — IMO0001 Reserved for inherently not codable concepts without codable children: Secondary | ICD-10-CM

## 2014-04-17 DIAGNOSIS — E782 Mixed hyperlipidemia: Secondary | ICD-10-CM

## 2014-04-17 HISTORY — DX: Tremor, unspecified: R25.1

## 2014-04-17 LAB — COMPREHENSIVE METABOLIC PANEL
ALBUMIN: 4.6 g/dL (ref 3.5–5.2)
ALK PHOS: 60 U/L (ref 39–117)
ALT: 22 U/L (ref 0–53)
AST: 31 U/L (ref 0–37)
BUN: 11 mg/dL (ref 6–23)
CHLORIDE: 103 meq/L (ref 96–112)
CO2: 27 mEq/L (ref 19–32)
CREATININE: 0.7 mg/dL (ref 0.40–1.50)
Calcium: 9.8 mg/dL (ref 8.4–10.5)
GFR: 121.14 mL/min (ref >60.00)
Glucose, Bld: 95 mg/dL (ref 70–99)
Potassium: 4.1 mEq/L (ref 3.5–5.1)
Sodium: 139 mEq/L (ref 135–145)
TOTAL PROTEIN: 7.5 g/dL (ref 6.0–8.3)
Total Bilirubin: 1 mg/dL (ref 0.2–1.2)

## 2014-04-17 LAB — LIPID PANEL
CHOLESTEROL: 214 mg/dL — AB (ref 0–200)
HDL: 62.8 mg/dL (ref >39.00)
LDL Cholesterol: 128 mg/dL — ABNORMAL HIGH (ref 0–99)
NONHDL: 151.2
TRIGLYCERIDES: 115 mg/dL (ref 0.0–149.0)
Total CHOL/HDL Ratio: 3
VLDL: 23 mg/dL (ref 0.0–40.0)

## 2014-04-17 LAB — TSH: TSH: 2.34 u[IU]/mL (ref 0.35–4.50)

## 2014-04-17 NOTE — Progress Notes (Signed)
Patient ID: George [name], male   DOB: Oct 13, 1950, 64 y.o.   MRN: [id number]   George [name]  [id number] 28-Nov-1950 04/17/2014      Progress Note-Follow Up  Subjective  Chief Complaint  Chief Complaint  Patient presents with  . Follow-up    6 mos    HPI  Patient is a 64 y.o. male in today for routine medical care. Patient in today for follow up. Is doing fairly well but does note some increase in a right hand tremor, he has had some trouble with this for about 10 years but has recently worsened. No other neurologic complaints. No recent illness or acute concerns. Denies CP/palp/SOB/HA/congestion/fevers/GI or GU c/o. Taking meds as prescribed  Past Medical History  Diagnosis Date  . High cholesterol   . GERD (gastroesophageal reflux disease)   . History of chicken pox     childhood  . Colon polyp   . CLL (chronic lymphocytic leukemia) 2009  . Preventative health care 10/23/2013    Past Surgical History  Procedure Laterality Date  . Hernia repair      right groin  . Esophageal dilation  2005    [facility]  . Colonoscopy    . Polypectomy      Family History  Problem Relation Age of Onset  . Hyperlipidemia Mother   . Hypertension Mother     mother-father  . Pulmonary fibrosis Mother   . Prostate cancer Neg Hx   . Breast cancer Neg Hx   . Colon cancer Neg Hx   . Diabetes      mother -father  . Diabetes Father   . Heart disease Father     congestive heart failure  . Other Father     brain tumor  . Mental illness Sister     depression  . GER disease Son   . Stroke Maternal Grandmother   . Diabetes Maternal Grandmother     History   Social History  . Marital Status: Single    Spouse Name: N/A    Number of Children: N/A  . Years of Education: N/A   Occupational History  . Not on file.   Social History Main Topics  . Smoking status: Former Smoker -- 1.00 packs/day for 22 years    Types: Cigarettes    Start date: 02/05/1970    Quit date: 03/07/1992    . Smokeless tobacco: Never Used     Comment: quit 21 years ago  . Alcohol Use: 3.6 oz/week    6 Cans of beer per week     Comment: social  . Drug Use: No  . Sexual Activity: Yes     Comment: lives with wife, no dietary restirctions   Other Topics Concern  . Not on file   Social History Narrative    Current Outpatient Prescriptions on File Prior to Visit  Medication Sig Dispense Refill  . aspirin 81 MG EC tablet Take 81 mg by mouth daily.      . cholecalciferol (VITAMIN D) 1000 UNITS tablet Take 1,000 Units by mouth daily. 2 tablets daily    . Coenzyme Q10 (CO Q 10 PO) Take by mouth every morning.     Marland Kitchen CRESTOR 40 MG tablet Take 1 tablet (40 mg total) by mouth daily. 90 tablet 3  . esomeprazole (NEXIUM) 40 MG capsule Take 40 mg by mouth every other day.    . esomeprazole (NEXIUM) 40 MG capsule TAKE 1 CAPSULE BY MOUTH ONCE DAILY 90 capsule  0  . GLUCOSAMINE HCL PO Take by mouth every morning.     Nyoka Cowden Tea, Camillia sinensis, (GREEN TEA PO) Take 315 mg by mouth every morning.     . lactobacillus acidophilus (BACID) TABS tablet Take 1 tablet by mouth daily.     . Multiple Vitamin (MULTI-VITAMIN PO) Take by mouth every morning.     Marland Kitchen NIACIN CR PO Take 500 mg by mouth daily.     . Omega-3 Fatty Acids (FISH OIL CONCENTRATE PO) Take by mouth every morning.     . psyllium (METAMUCIL) 58.6 % powder Take 1 packet by mouth 2 (two) times daily.     . Zinc 30 MG TABS Take by mouth every morning.     No current facility-administered medications on file prior to visit.    No Known Allergies  Review of Systems  Review of Systems  Constitutional: Negative for fever and malaise/fatigue.  HENT: Negative for congestion.   Eyes: Negative for discharge.  Respiratory: Negative for shortness of breath.   Cardiovascular: Negative for chest pain, palpitations and leg swelling.  Gastrointestinal: Negative for nausea, abdominal pain and diarrhea.  Genitourinary: Negative for dysuria.   Musculoskeletal: Negative for falls.  Skin: Negative for rash.  Neurological: Positive for tremors. Negative for loss of consciousness and headaches.  Endo/Heme/Allergies: Negative for polydipsia.  Psychiatric/Behavioral: Negative for depression and suicidal ideas. The patient is not nervous/anxious and does not have insomnia.     Objective  BP 139/93 mmHg  Pulse 71  Temp(Src) 97.6 F (36.4 C) (Oral)  Ht 5\' 7"  (1.702 m)  Wt 162 lb 12.8 oz (73.846 kg)  BMI 25.49 kg/m2  SpO2 98%  Physical Exam  Physical Exam  Constitutional: He is oriented to person, place, and time and well-developed, well-nourished, and in no distress. No distress.  HENT:  Head: Normocephalic and atraumatic.  Eyes: Conjunctivae are normal.  Neck: Neck supple. No thyromegaly present.  Cardiovascular: Normal rate, regular rhythm and normal heart sounds.   No murmur heard. Pulmonary/Chest: Effort normal and breath sounds normal. No respiratory distress.  Abdominal: He exhibits no distension and no mass. There is no tenderness.  Musculoskeletal: He exhibits no edema.  Neurological: He is alert and oriented to person, place, and time.  Skin: Skin is warm.  Psychiatric: Memory, affect and judgment normal.    Lab Results  Component Value Date   TSH 0.700 10/03/2013   Lab Results  Component Value Date   WBC 48.2* 03/08/2014   HGB 13.9 03/08/2014   HCT 41.6 03/08/2014   MCV 92 03/08/2014   PLT 208 03/08/2014   Lab Results  Component Value Date   CREATININE 0.66 10/03/2013   BUN 9 10/03/2013   NA 139 10/03/2013   K 4.5 10/03/2013   CL 102 10/03/2013   CO2 29 10/03/2013   Lab Results  Component Value Date   ALT 21 10/03/2013   AST 28 10/03/2013   ALKPHOS 54 10/03/2013   BILITOT 0.7 10/03/2013   Lab Results  Component Value Date   CHOL 196 10/03/2013   Lab Results  Component Value Date   HDL 73 10/03/2013   Lab Results  Component Value Date   LDLCALC 102* 10/03/2013   Lab Results   Component Value Date   TRIG 104 10/03/2013   Lab Results  Component Value Date   CHOLHDL 2.7 10/03/2013     Assessment & Plan  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 Runs in his  family his mother had a work up that resulted in a benign diagnosis. Will monitor for now and consider a beta blocakade and referral if worsens   Elevated BP Well controlled, no changes to meds. Encouraged heart healthy diet such as the DASH diet and exercise as tolerated. Improved on recheck    Hyperlipidemia, mixed Tolerating statin, encouraged heart healthy diet, avoid trans fats, minimize simple carbs and saturated fats. Increase exercise as tolerated   GERD (gastroesophageal reflux disease) Avoid offending foods, start probiotics. Do not eat large meals in late evening and consider raising head of bed.

## 2014-04-17 NOTE — Progress Notes (Signed)
Pre visit review using our clinic review tool, if applicable. No additional management support is needed unless otherwise documented below in the visit note. 

## 2014-04-17 NOTE — Patient Instructions (Signed)

## 2014-04-17 NOTE — Assessment & Plan Note (Addendum)
Has been present for over 10 years. Runs in his family his mother had a work up that resulted in a benign diagnosis. Will monitor for now and consider a beta blocakade and referral if worsens

## 2014-04-18 ENCOUNTER — Telehealth: Payer: Self-pay | Admitting: *Deleted

## 2014-04-18 NOTE — Telephone Encounter (Signed)
-----   Message from Mosie Lukes, MD sent at 04/17/2014  1:42 PM EST ----- Please let this gentleman know I heard back from Dr Marin Olp and he agrees we can proceed with Zostavax. ----- Message -----    From: Volanda Napoleon, MD    Sent: 04/17/2014   1:18 PM      To: Mosie Lukes, MD  Go right ahead!!!!!!  pete ----- Message -----    From: Mosie Lukes, MD    Sent: 04/17/2014   8:13 AM      To: Volanda Napoleon, MD  Quick question  Any objection to this gentleman getting a Zostavax immunization? Just told him I would run it by you.   Thanks  [employer]

## 2014-04-18 NOTE — Telephone Encounter (Signed)
Called and [facility] @ 6:53pm @ [phone]) informing the pt of Dr. Charlett Blake note below.  Informed the pt that he can call and schedule an nurse visit to have the Zostavax given.//AB/CMA

## 2014-04-23 ENCOUNTER — Encounter: Payer: Self-pay | Admitting: Family Medicine

## 2014-04-23 DIAGNOSIS — IMO0001 Reserved for inherently not codable concepts without codable children: Secondary | ICD-10-CM

## 2014-04-23 DIAGNOSIS — R030 Elevated blood-pressure reading, without diagnosis of hypertension: Secondary | ICD-10-CM | POA: Insufficient documentation

## 2014-04-23 HISTORY — DX: Reserved for inherently not codable concepts without codable children: IMO0001

## 2014-04-23 NOTE — Assessment & Plan Note (Signed)
Well controlled, no changes to meds. Encouraged heart healthy diet such as the DASH diet and exercise as tolerated. Improved on recheck 

## 2014-04-23 NOTE — Assessment & Plan Note (Signed)
Tolerating statin, encouraged heart healthy diet, avoid trans fats, minimize simple carbs and saturated fats. Increase exercise as tolerated 

## 2014-04-23 NOTE — Assessment & Plan Note (Signed)
Avoid offending foods, start probiotics. Do not eat large meals in late evening and consider raising head of bed.  

## 2014-07-04 ENCOUNTER — Other Ambulatory Visit: Payer: Self-pay | Admitting: Family Medicine

## 2014-07-14 ENCOUNTER — Encounter: Payer: Self-pay | Admitting: Physician Assistant

## 2014-07-14 ENCOUNTER — Ambulatory Visit (INDEPENDENT_AMBULATORY_CARE_PROVIDER_SITE_OTHER): Payer: 59 | Admitting: Physician Assistant

## 2014-07-14 VITALS — BP 135/97 | HR 84 | Temp 98.4°F | Resp 16 | Ht 67.0 in | Wt 160.2 lb

## 2014-07-14 DIAGNOSIS — W57XXXA Bitten or stung by nonvenomous insect and other nonvenomous arthropods, initial encounter: Secondary | ICD-10-CM | POA: Insufficient documentation

## 2014-07-14 DIAGNOSIS — T148 Other injury of unspecified body region: Secondary | ICD-10-CM

## 2014-07-14 MED ORDER — DOXYCYCLINE HYCLATE 100 MG PO CAPS: 100.0000 mg | ORAL_CAPSULE | Freq: Two times a day (BID) | ORAL | Status: DC

## 2014-07-14 NOTE — Progress Notes (Signed)
Pre visit review using our clinic review tool, if applicable. No additional management support is needed unless otherwise documented below in the visit note/SLS  

## 2014-07-14 NOTE — Progress Notes (Signed)
Patient presents to clinic today c/o itchy and red area of posterior left thigh, where he removed a tick 2 days ago.  Endorses getting entire tick out with help of his wife.  Has kept area clean and dry with use of neosporin.  Denies fever, chills, malaise or rash.  Endorses redness, and tenderness around area..  Past Medical History  Diagnosis Date  . High cholesterol   . GERD (gastroesophageal reflux disease)   . History of chicken pox     childhood  . Colon polyp   . CLL (chronic lymphocytic leukemia) 2009  . Preventative health care 10/23/2013  . Tremor of right hand 04/17/2014  . Elevated BP 04/23/2014    Current Outpatient Prescriptions on File Prior to Visit  Medication Sig Dispense Refill  . aspirin 81 MG EC tablet Take 81 mg by mouth daily.      . cholecalciferol (VITAMIN D) 1000 UNITS tablet Take 1,000 Units by mouth daily. 2 tablets daily    . Coenzyme Q10 (CO Q 10 PO) Take by mouth every morning.     Marland Kitchen CRESTOR 40 MG tablet TAKE 1 TABLET BY MOUTH ONCE DAILY 90 tablet 1  . esomeprazole (NEXIUM) 40 MG capsule Take 40 mg by mouth every other day.    Marland Kitchen GLUCOSAMINE HCL PO Take by mouth every morning.     Nyoka Cowden Tea, Camillia sinensis, (GREEN TEA PO) Take 315 mg by mouth every morning.     . lactobacillus acidophilus (BACID) TABS tablet Take 1 tablet by mouth daily.     . Multiple Vitamin (MULTI-VITAMIN PO) Take by mouth every morning.     Marland Kitchen NIACIN CR PO Take 500 mg by mouth daily.     . Omega-3 Fatty Acids (FISH OIL CONCENTRATE PO) Take by mouth every morning.     . psyllium (METAMUCIL) 58.6 % powder Take 1 packet by mouth 2 (two) times daily.     . Zinc 30 MG TABS Take by mouth every morning.     No current facility-administered medications on file prior to visit.    No Known Allergies  Family History  Problem Relation Age of Onset  . Hyperlipidemia Mother   . Hypertension Mother     mother-father  . Pulmonary fibrosis Mother   . Prostate cancer Neg Hx   . Breast  cancer Neg Hx   . Colon cancer Neg Hx   . Diabetes      mother -father  . Diabetes Father   . Heart disease Father     congestive heart failure  . Other Father     brain tumor  . Mental illness Sister     depression  . GER disease Son   . Stroke Maternal Grandmother   . Diabetes Maternal Grandmother     History   Social History  . Marital Status: Single    Spouse Name: N/A  . Number of Children: N/A  . Years of Education: N/A   Social History Main Topics  . Smoking status: Former Smoker -- 1.00 packs/day for 22 years    Types: Cigarettes    Start date: 02/05/1970    Quit date: 03/07/1992  . Smokeless tobacco: Never Used     Comment: quit 21 years ago  . Alcohol Use: 3.6 oz/week    6 Cans of beer per week     Comment: social  . Drug Use: No  . Sexual Activity: Yes     Comment: lives with wife, no dietary  restirctions   Other Topics Concern  . None   Social History Narrative   Review of Systems - See HPI.  All other ROS are negative.  BP 135/97 mmHg  Pulse 84  Temp(Src) 98.4 F (36.9 C) (Oral)  Resp 16  Ht 5\' 7"  (1.702 m)  Wt 160 lb 4 oz (72.689 kg)  BMI 25.09 kg/m2  SpO2 100%  Physical Exam  Constitutional: He is oriented to person, place, and time and well-developed, well-nourished, and in no distress.  HENT:  Head: Normocephalic and atraumatic.  Eyes: Conjunctivae are normal.  Cardiovascular: Normal rate, regular rhythm, normal heart sounds and intact distal pulses.   Pulmonary/Chest: Effort normal and breath sounds normal. No respiratory distress. He has no wheezes. He has no rales. He exhibits no tenderness.  Neurological: He is alert and oriented to person, place, and time.  Skin: Skin is warm and dry.     Vitals reviewed.   Recent Results (from the past 2160 hour(s))  Lipid panel     Status: Abnormal   Collection Time: 04/17/14  8:22 AM  Result Value Ref Range   Cholesterol 214 (H) 0 - 200 mg/dL    Comment: ATP III Classification        Desirable:  < 200 mg/dL               Borderline High:  200 - 239 mg/dL          High:  > = 240 mg/dL   Triglycerides 115.0 0.0 - 149.0 mg/dL    Comment: Normal:  <150 mg/dLBorderline High:  150 - 199 mg/dL   HDL 62.80 >39.00 mg/dL   VLDL 23.0 0.0 - 40.0 mg/dL   LDL Cholesterol 128 (H) 0 - 99 mg/dL   Total CHOL/HDL Ratio 3     Comment:                Men          Women1/2 Average Risk     3.4          3.3Average Risk          5.0          4.42X Average Risk          9.6          7.13X Average Risk          15.0          11.0                       NonHDL 151.20     Comment: NOTE:  Non-HDL goal should be 30 mg/dL higher than patient's LDL goal (i.e. LDL goal of < 70 mg/dL, would have non-HDL goal of < 100 mg/dL)  TSH     Status: None   Collection Time: 04/17/14  8:22 AM  Result Value Ref Range   TSH 2.34 0.35 - 4.50 uIU/mL  Comprehensive metabolic panel     Status: None   Collection Time: 04/17/14  8:22 AM  Result Value Ref Range   Sodium 139 135 - 145 mEq/L   Potassium 4.1 3.5 - 5.1 mEq/L   Chloride 103 96 - 112 mEq/L   CO2 27 19 - 32 mEq/L   Glucose, Bld 95 70 - 99 mg/dL   BUN 11 6 - 23 mg/dL   Creatinine, Ser 0.70 0.40 - 1.50 mg/dL   Total Bilirubin 1.0 0.2 - 1.2 mg/dL   Alkaline Phosphatase  60 39 - 117 U/L   AST 31 0 - 37 U/L   ALT 22 0 - 53 U/L   Total Protein 7.5 6.0 - 8.3 g/dL   Albumin 4.6 3.5 - 5.2 g/dL   Calcium 9.8 8.4 - 10.5 mg/dL   GFR 121.14 >60.00 mL/min    Assessment/Plan: Tick bite With noted cellulitis.  Some tenderness and itching, but otherwise asymptomatic. Will begin Doxycycline. Topical Bacitracin.  Supportive measures discussed.  Follow-up if not improving.

## 2014-07-14 NOTE — Assessment & Plan Note (Signed)
With noted cellulitis.  Some tenderness and itching, but otherwise asymptomatic. Will begin Doxycycline. Topical Bacitracin.  Supportive measures discussed.  Follow-up if not improving.

## 2014-07-14 NOTE — Patient Instructions (Signed)
Please take antibiotic as directed for 7 days. Keep area clean and dry. Apply topical Bacitracin to the area instead of Neosporin. Ice to the area and Benadryl at bedtime. Call or return if symptoms are not improving.

## 2014-09-28 ENCOUNTER — Telehealth: Payer: Self-pay | Admitting: Family Medicine

## 2014-09-28 NOTE — Telephone Encounter (Signed)
error:[id number] ° °

## 2014-09-28 NOTE — Telephone Encounter (Signed)
pre visit letter mailed 09/28/14

## 2014-10-06 ENCOUNTER — Telehealth: Payer: Self-pay | Admitting: Family Medicine

## 2014-10-06 MED ORDER — CRESTOR 40 MG PO TABS: 40.0000 mg | ORAL_TABLET | Freq: Every day | ORAL | Status: DC

## 2014-10-06 NOTE — Telephone Encounter (Signed)
Prescription sent in and patient informed sent to [facility].

## 2014-10-06 NOTE — Telephone Encounter (Signed)
Relation to pt: self  Call back number:[phone] Pharmacy: [facility]   Reason for call:  Patient requesting generic  CRESTOR 40 MG tablet, please send to  [place], [place] [facility] [place] [phone] (Phone) [phone] (Fax)

## 2014-10-09 ENCOUNTER — Other Ambulatory Visit: Payer: Self-pay | Admitting: Family Medicine

## 2014-10-09 DIAGNOSIS — C9110 Chronic lymphocytic leukemia of B-cell type not having achieved remission: Secondary | ICD-10-CM

## 2014-10-09 MED ORDER — ESOMEPRAZOLE MAGNESIUM 40 MG PO CPDR: 40.0000 mg | DELAYED_RELEASE_CAPSULE | ORAL | Status: DC

## 2014-10-19 ENCOUNTER — Encounter: Payer: 59 | Admitting: Family Medicine

## 2014-11-02 ENCOUNTER — Encounter: Payer: Self-pay | Admitting: Family Medicine

## 2014-11-03 ENCOUNTER — Telehealth: Payer: Self-pay

## 2014-11-03 MED ORDER — ROSUVASTATIN CALCIUM 40 MG PO TABS: 40.0000 mg | ORAL_TABLET | Freq: Every day | ORAL | Status: DC

## 2014-11-03 NOTE — Telephone Encounter (Signed)
Pt notified rx fixed. No questions or concerns at this time.

## 2014-12-11 ENCOUNTER — Ambulatory Visit (INDEPENDENT_AMBULATORY_CARE_PROVIDER_SITE_OTHER): Payer: 59 | Admitting: Family Medicine

## 2014-12-11 ENCOUNTER — Encounter: Payer: Self-pay | Admitting: Family Medicine

## 2014-12-11 VITALS — BP 122/84 | HR 66 | Temp 98.5°F | Ht 68.0 in | Wt 157.5 lb

## 2014-12-11 DIAGNOSIS — K219 Gastro-esophageal reflux disease without esophagitis: Secondary | ICD-10-CM

## 2014-12-11 DIAGNOSIS — C9110 Chronic lymphocytic leukemia of B-cell type not having achieved remission: Secondary | ICD-10-CM | POA: Diagnosis not present

## 2014-12-11 DIAGNOSIS — R030 Elevated blood-pressure reading, without diagnosis of hypertension: Secondary | ICD-10-CM

## 2014-12-11 DIAGNOSIS — E782 Mixed hyperlipidemia: Secondary | ICD-10-CM

## 2014-12-11 DIAGNOSIS — IMO0001 Reserved for inherently not codable concepts without codable children: Secondary | ICD-10-CM

## 2014-12-11 MED ORDER — ESOMEPRAZOLE MAGNESIUM 40 MG PO CPDR: 40.0000 mg | DELAYED_RELEASE_CAPSULE | Freq: Every day | ORAL | Status: DC | PRN

## 2014-12-11 NOTE — Progress Notes (Signed)
Pre visit review using our clinic review tool, if applicable. No additional management support is needed unless otherwise documented below in the visit note. 

## 2014-12-11 NOTE — Assessment & Plan Note (Signed)
Well controlled, no changes to meds. Encouraged heart healthy diet such as the DASH diet and exercise as tolerated.  °

## 2014-12-11 NOTE — Patient Instructions (Signed)

## 2014-12-12 ENCOUNTER — Telehealth: Payer: Self-pay | Admitting: *Deleted

## 2014-12-12 LAB — LIPID PANEL
CHOLESTEROL: 202 mg/dL — AB (ref 0–200)
HDL: 56.7 mg/dL (ref >39.00)
LDL Cholesterol: 116 mg/dL — ABNORMAL HIGH (ref 0–99)
NONHDL: 145.3
Total CHOL/HDL Ratio: 4
Triglycerides: 145 mg/dL (ref 0.0–149.0)
VLDL: 29 mg/dL (ref 0.0–40.0)

## 2014-12-12 LAB — CBC
HCT: 44.6 % (ref 39.0–52.0)
HEMOGLOBIN: 14.8 g/dL (ref 13.0–17.0)
MCHC: 33.1 g/dL (ref 30.0–36.0)
MCV: 94.1 fl (ref 78.0–100.0)
Platelets: 240 10*3/uL (ref 150.0–400.0)
RBC: 4.74 Mil/uL (ref 4.22–5.81)
RDW: 13.1 % (ref 11.5–15.5)

## 2014-12-12 LAB — COMPREHENSIVE METABOLIC PANEL
ALBUMIN: 4.6 g/dL (ref 3.5–5.2)
ALK PHOS: 59 U/L (ref 39–117)
ALT: 17 U/L (ref 0–53)
AST: 23 U/L (ref 0–37)
BUN: 10 mg/dL (ref 6–23)
CO2: 25 mEq/L (ref 19–32)
Calcium: 9.6 mg/dL (ref 8.4–10.5)
Chloride: 103 mEq/L (ref 96–112)
Creatinine, Ser: 0.74 mg/dL (ref 0.40–1.50)
GFR: 113.38 mL/min (ref >60.00)
Glucose, Bld: 75 mg/dL (ref 70–99)
POTASSIUM: 4.2 meq/L (ref 3.5–5.1)
Sodium: 139 mEq/L (ref 135–145)
TOTAL PROTEIN: 7.4 g/dL (ref 6.0–8.3)
Total Bilirubin: 1 mg/dL (ref 0.2–1.2)

## 2014-12-12 LAB — TSH: TSH: 1.4 u[IU]/mL (ref 0.35–4.50)

## 2014-12-12 NOTE — Telephone Encounter (Signed)
[facility] called to report critical  -- pts White count [phone]

## 2014-12-17 ENCOUNTER — Encounter: Payer: Self-pay | Admitting: Family Medicine

## 2014-12-17 NOTE — Assessment & Plan Note (Signed)
Tolerating statin, encouraged heart healthy diet, avoid trans fats, minimize simple carbs and saturated fats. Increase exercise as tolerated 

## 2014-12-17 NOTE — Assessment & Plan Note (Signed)
Persistently elevated WBC, patient feeling well has an appt with hematology in next couple of months

## 2014-12-17 NOTE — Progress Notes (Signed)
Subjective:    Patient ID: George [name], male    DOB: 1950/05/10, 64 y.o.   MRN: [id number]  Chief Complaint  Patient presents with  . Follow-up    HPI Patient is in today for follow-up on numerous concerns including hyperlipidemia. Is tolerating Crestor. No recent illness or acute concerns. Nexium seems to be helping with heartburn. Denies CP/palp/SOB/HA/congestion/fevers/GI or GU c/o. Taking meds as prescribed  Past Medical History  Diagnosis Date  . High cholesterol   . GERD (gastroesophageal reflux disease)   . History of chicken pox     childhood  . Colon polyp   . CLL (chronic lymphocytic leukemia) 2009  . Preventative health care 10/23/2013  . Tremor of right hand 04/17/2014  . Elevated BP 04/23/2014    Past Surgical History  Procedure Laterality Date  . Hernia repair      right groin  . Esophageal dilation  2005    [facility]  . Colonoscopy    . Polypectomy      Family History  Problem Relation Age of Onset  . Hyperlipidemia Mother   . Hypertension Mother     mother-father  . Pulmonary fibrosis Mother   . Prostate cancer Neg Hx   . Breast cancer Neg Hx   . Colon cancer Neg Hx   . Diabetes      mother -father  . Diabetes Father   . Heart disease Father     congestive heart failure  . Other Father     brain tumor  . Mental illness Sister     depression  . GER disease Son   . Stroke Maternal Grandmother   . Diabetes Maternal Grandmother     Social History   Social History  . Marital Status: Single    Spouse Name: N/A  . Number of Children: N/A  . Years of Education: N/A   Occupational History  . Not on file.   Social History Main Topics  . Smoking status: Former Smoker -- 1.00 packs/day for 22 years    Types: Cigarettes    Start date: 02/05/1970    Quit date: 03/07/1992  . Smokeless tobacco: Never Used     Comment: quit 21 years ago  . Alcohol Use: 3.6 oz/week    6 Cans of beer per week     Comment: social  . Drug Use: No  . Sexual  Activity: Yes     Comment: lives with wife, no dietary restirctions   Other Topics Concern  . Not on file   Social History Narrative    Outpatient Prescriptions Prior to Visit  Medication Sig Dispense Refill  . aspirin 81 MG EC tablet Take 81 mg by mouth daily.      . cholecalciferol (VITAMIN D) 1000 UNITS tablet Take 1,000 Units by mouth daily. 2 tablets daily    . Coenzyme Q10 (CO Q 10 PO) Take by mouth every morning.     Marland Kitchen GLUCOSAMINE HCL PO Take by mouth every morning.     Nyoka Cowden Tea, Camillia sinensis, (GREEN TEA PO) Take 315 mg by mouth every morning.     . lactobacillus acidophilus (BACID) TABS tablet Take 1 tablet by mouth daily.     . Multiple Vitamin (MULTI-VITAMIN PO) Take by mouth every morning.     Marland Kitchen NIACIN CR PO Take 500 mg by mouth daily.     . Omega-3 Fatty Acids (FISH OIL CONCENTRATE PO) Take by mouth every morning.     Marland Kitchen  psyllium (METAMUCIL) 58.6 % powder Take 1 packet by mouth 2 (two) times daily.     . rosuvastatin (CRESTOR) 40 MG tablet Take 1 tablet (40 mg total) by mouth daily. 90 tablet 1  . Zinc 30 MG TABS Take by mouth every morning.    Marland Kitchen esomeprazole (NEXIUM) 40 MG capsule Take 1 capsule (40 mg total) by mouth every other day. 30 capsule 0  . doxycycline (VIBRAMYCIN) 100 MG capsule Take 1 capsule (100 mg total) by mouth 2 (two) times daily. 14 capsule 0   No facility-administered medications prior to visit.    No Known Allergies  Review of Systems  Constitutional: Negative for fever and malaise/fatigue.  HENT: Negative for congestion.   Eyes: Negative for discharge.  Respiratory: Negative for shortness of breath.   Cardiovascular: Negative for chest pain, palpitations and leg swelling.  Gastrointestinal: Positive for heartburn. Negative for nausea and abdominal pain.  Genitourinary: Negative for dysuria.  Musculoskeletal: Negative for falls.  Skin: Negative for rash.  Neurological: Negative for loss of consciousness and headaches.    Endo/Heme/Allergies: Negative for environmental allergies.  Psychiatric/Behavioral: Negative for depression. The patient is not nervous/anxious.        Objective:    Physical Exam  Constitutional: He is oriented to person, place, and time. He appears well-developed and well-nourished. No distress.  HENT:  Head: Normocephalic and atraumatic.  Nose: Nose normal.  Eyes: Right eye exhibits no discharge. Left eye exhibits no discharge.  Neck: Normal range of motion. Neck supple.  Cardiovascular: Normal rate and regular rhythm.   No murmur heard. Pulmonary/Chest: Effort normal and breath sounds normal.  Abdominal: Soft. Bowel sounds are normal. There is no tenderness.  Musculoskeletal: He exhibits no edema.  Neurological: He is alert and oriented to person, place, and time.  Skin: Skin is warm and dry.  Psychiatric: He has a normal mood and affect.  Nursing note and vitals reviewed.   BP 122/84 mmHg  Pulse 66  Temp(Src) 98.5 F (36.9 C) (Oral)  Ht 5\' 8"  (1.727 m)  Wt 157 lb 8 oz (71.442 kg)  BMI 23.95 kg/m2  SpO2 96% Wt Readings from Last 3 Encounters:  12/11/14 157 lb 8 oz (71.442 kg)  07/14/14 160 lb 4 oz (72.689 kg)  04/17/14 162 lb 12.8 oz (73.846 kg)     Lab Results  Component Value Date   WBC 52.3 Repeated and verified X2.* 12/11/2014   HGB 14.8 12/11/2014   HCT 44.6 12/11/2014   PLT 240.0 12/11/2014   GLUCOSE 75 12/11/2014   CHOL 202* 12/11/2014   TRIG 145.0 12/11/2014   HDL 56.70 12/11/2014   LDLCALC 116* 12/11/2014   ALT 17 12/11/2014   AST 23 12/11/2014   NA 139 12/11/2014   K 4.2 12/11/2014   CL 103 12/11/2014   CREATININE 0.74 12/11/2014   BUN 10 12/11/2014   CO2 25 12/11/2014   TSH 1.40 12/11/2014   PSA 0.53 10/01/2011    Lab Results  Component Value Date   TSH 1.40 12/11/2014   Lab Results  Component Value Date   WBC 52.3 Repeated and verified X2.* 12/11/2014   HGB 14.8 12/11/2014   HCT 44.6 12/11/2014   MCV 94.1 12/11/2014   PLT  240.0 12/11/2014   Lab Results  Component Value Date   NA 139 12/11/2014   K 4.2 12/11/2014   CO2 25 12/11/2014   GLUCOSE 75 12/11/2014   BUN 10 12/11/2014   CREATININE 0.74 12/11/2014   BILITOT 1.0 12/11/2014  ALKPHOS 59 12/11/2014   AST 23 12/11/2014   ALT 17 12/11/2014   PROT 7.4 12/11/2014   ALBUMIN 4.6 12/11/2014   CALCIUM 9.6 12/11/2014   GFR 113.38 12/11/2014   Lab Results  Component Value Date   CHOL 202* 12/11/2014   Lab Results  Component Value Date   HDL 56.70 12/11/2014   Lab Results  Component Value Date   LDLCALC 116* 12/11/2014   Lab Results  Component Value Date   TRIG 145.0 12/11/2014   Lab Results  Component Value Date   CHOLHDL 4 12/11/2014   No results found for: HGBA1C     Assessment & Plan:   Problem List Items Addressed This Visit    Hyperlipidemia, mixed    Tolerating statin, encouraged heart healthy diet, avoid trans fats, minimize simple carbs and saturated fats. Increase exercise as tolerated      Relevant Orders   TSH (Completed)   CBC (Completed)   Lipid panel (Completed)   Comprehensive metabolic panel (Completed)   GERD (gastroesophageal reflux disease)    Avoid offending foods, start probiotics. Do not eat large meals in late evening and consider raising head of bed. Does fairly well on Nexium      Relevant Medications   esomeprazole (NEXIUM) 40 MG capsule   Elevated BP    Well controlled, no changes to meds. Encouraged heart healthy diet such as the DASH diet and exercise as tolerated.       Relevant Orders   TSH (Completed)   CBC (Completed)   Lipid panel (Completed)   Comprehensive metabolic panel (Completed)   CLL (chronic lymphocytic leukemia) - Primary    Persistently elevated WBC, patient feeling well has an appt with hematology in next couple of months      Relevant Medications   esomeprazole (NEXIUM) 40 MG capsule   Other Relevant Orders   TSH (Completed)   CBC (Completed)   Lipid panel (Completed)    Comprehensive metabolic panel (Completed)      I have discontinued Mr. Lembo doxycycline. I have also changed his esomeprazole. Additionally, I am having him maintain his aspirin, Coenzyme Q10 (CO Q 10 PO), NIACIN CR PO, GLUCOSAMINE HCL PO, Omega-3 Fatty Acids (FISH OIL CONCENTRATE PO), Multiple Vitamin (MULTI-VITAMIN PO), (Green Tea, Camillia sinensis, (GREEN TEA PO)), cholecalciferol, Zinc, lactobacillus acidophilus, psyllium, and rosuvastatin.  Meds ordered this encounter  Medications  . esomeprazole (NEXIUM) 40 MG capsule    Sig: Take 1 capsule (40 mg total) by mouth daily as needed.    Dispense:  90 capsule    Refill:  1     BLYTH, STACEY, MD

## 2014-12-17 NOTE — Assessment & Plan Note (Signed)
Avoid offending foods, start probiotics. Do not eat large meals in late evening and consider raising head of bed. Does fairly well on Nexium

## 2014-12-29 ENCOUNTER — Encounter: Payer: Self-pay | Admitting: Family Medicine

## 2015-03-07 ENCOUNTER — Encounter: Payer: Self-pay | Admitting: Family Medicine

## 2015-03-07 ENCOUNTER — Ambulatory Visit (HOSPITAL_BASED_OUTPATIENT_CLINIC_OR_DEPARTMENT_OTHER): Payer: 59 | Admitting: Hematology & Oncology

## 2015-03-07 ENCOUNTER — Encounter: Payer: Self-pay | Admitting: Hematology & Oncology

## 2015-03-07 ENCOUNTER — Other Ambulatory Visit (HOSPITAL_BASED_OUTPATIENT_CLINIC_OR_DEPARTMENT_OTHER): Payer: 59

## 2015-03-07 VITALS — BP 141/87 | HR 80 | Temp 98.1°F | Resp 18 | Ht 68.0 in | Wt 161.0 lb

## 2015-03-07 DIAGNOSIS — C9110 Chronic lymphocytic leukemia of B-cell type not having achieved remission: Secondary | ICD-10-CM | POA: Diagnosis not present

## 2015-03-07 LAB — MANUAL DIFFERENTIAL (CHCC SATELLITE)
ALC: 49.8 10*3/uL — AB (ref 0.9–3.3)
ANC (CHCC MAN DIFF): 3.7 10*3/uL (ref 1.5–6.5)
LYMPH: 93 % — AB (ref 14–48)
PLT EST ~~LOC~~: ADEQUATE
RBC COMMENTS: NORMAL
SEG: 7 % — ABNORMAL LOW (ref 40–75)

## 2015-03-07 LAB — CBC WITH DIFFERENTIAL (CANCER CENTER ONLY)
HCT: 42.3 % (ref 38.7–49.9)
HGB: 13.9 g/dL (ref 13.0–17.1)
MCH: 30.5 pg (ref 28.0–33.4)
MCHC: 32.9 g/dL (ref 32.0–35.9)
MCV: 93 fL (ref 82–98)
Platelets: 214 10*3/uL (ref 145–400)
RBC: 4.56 10*6/uL (ref 4.20–5.70)
RDW: 13.3 % (ref 11.1–15.7)
WBC: 53.5 10*3/uL — AB (ref 4.0–10.0)

## 2015-03-07 LAB — CHCC SATELLITE - SMEAR

## 2015-03-07 NOTE — Progress Notes (Signed)
Hematology and Oncology Follow Up Visit  George [name] [id number] Nov 23, 1950 64 y.o. 03/07/2015   Principle Diagnosis:   CLL-stage A  Current Therapy:    Observation     Interim History:  [name] is back for followup. We saw him last year. His been 12 months as we saw him.  As always, he has wife have been traveling. That a very good summer out [place]. The only going out to [place] for skiing this winter.   He's had no problems with palpable lymph nodes. He's had no fever. He's had no weight loss or weight gain.   His been no change in bowel or bladder habits. He's had no rashes.  He still is exercising quite a bit.   Overall, his performance test is ECOG 0.    Medications:  Current outpatient prescriptions:  .  aspirin 81 MG EC tablet, Take 81 mg by mouth daily.  , Disp: , Rfl:  .  cholecalciferol (VITAMIN D) 1000 UNITS tablet, Take 1,000 Units by mouth daily. 2 tablets daily, Disp: , Rfl:  .  Coenzyme Q10 (CO Q 10 PO), Take by mouth every morning. , Disp: , Rfl:  .  esomeprazole (NEXIUM) 40 MG capsule, Take 1 capsule (40 mg total) by mouth daily as needed., Disp: 90 capsule, Rfl: 1 .  GLUCOSAMINE HCL PO, Take by mouth every morning. , Disp: , Rfl:  .  Green Tea, Camillia sinensis, (GREEN TEA PO), Take 315 mg by mouth every morning. , Disp: , Rfl:  .  lactobacillus acidophilus (BACID) TABS tablet, Take 1 tablet by mouth daily. , Disp: , Rfl:  .  Multiple Vitamin (MULTI-VITAMIN PO), Take by mouth every morning. , Disp: , Rfl:  .  NIACIN CR PO, Take 500 mg by mouth daily. , Disp: , Rfl:  .  Omega-3 Fatty Acids (FISH OIL CONCENTRATE PO), Take by mouth every morning. , Disp: , Rfl:  .  psyllium (METAMUCIL) 58.6 % powder, Take 1 packet by mouth 2 (two) times daily. , Disp: , Rfl:  .  rosuvastatin (CRESTOR) 40 MG tablet, Take 1 tablet (40 mg total) by mouth daily., Disp: 90 tablet, Rfl: 1 .  SEB-PREV WASH 10 % LIQD, , Disp: , Rfl: 3 .  Zinc 30 MG TABS, Take by mouth every  morning., Disp: , Rfl:   Allergies: No Known Allergies  Past Medical History, Surgical history, Social history, and Family History were reviewed and updated.  Review of Systems: As above  Physical Exam:  height is _0  (1.727 m) and weight is 161 lb (73.029 kg). His oral temperature is 98.1 F (36.7 C). His blood pressure is 141/87 and his pulse is 80. His respiration is 18.   Well-developed well-nourished gentleman. Head and neck exam shows no adenopathy in the neck. Thyroid is not palpable. No oral lesions are noted. Lungs are clear. Cardiac exam regular rate and rhythm with no murmurs rubs or bruits. Abdomen is soft. He has good bowel sounds. There is no fluid wave. There is no palpable liver or spleen tip. Axillary exam shows no bilateral axillary adenopathy. Back exam no tenderness over the spine ribs or hips. Extremities shows no clubbing cyanosis or edema. He has good strength in his extremities. He has good range of motion of his joints. Skin exam no rashes ecchymosis or petechia. Neurological exam is nonfocal.  Lab Results  Component Value Date   WBC 53.5* 03/07/2015   HGB 13.9 03/07/2015   HCT 42.3 03/07/2015  MCV 93 03/07/2015   PLT 214 03/07/2015     Chemistry      Component Value Date/Time   NA 139 12/11/2014 1550   K 4.2 12/11/2014 1550   CL 103 12/11/2014 1550   CO2 25 12/11/2014 1550   BUN 10 12/11/2014 1550   CREATININE 0.74 12/11/2014 1550   CREATININE 0.66 10/03/2013 0800      Component Value Date/Time   CALCIUM 9.6 12/11/2014 1550   ALKPHOS 59 12/11/2014 1550   AST 23 12/11/2014 1550   ALT 17 12/11/2014 1550   BILITOT 1.0 12/11/2014 1550         Impression and Plan: George [name] is a 64 year old gentleman. He has CLL. We have been following him now for about 87 years. His disease has been progressing very slowly. Currently, there no indication for any intervention right now even though his wife's account is a little bit. He has a stable right  axillary lymph node..  We will plan to get him back in one year. I think this is reasonable.  Again, I don't see any indication for therapy. I don't see any indication for doing a bone marrow biopsy or other study on him.   George Napoleon, MD 12/14/20168:29 AM

## 2015-03-08 ENCOUNTER — Ambulatory Visit (INDEPENDENT_AMBULATORY_CARE_PROVIDER_SITE_OTHER): Payer: 59

## 2015-03-08 DIAGNOSIS — Z23 Encounter for immunization: Secondary | ICD-10-CM

## 2015-03-13 ENCOUNTER — Ambulatory Visit: Payer: 59

## 2015-03-19 ENCOUNTER — Telehealth: Payer: 59 | Admitting: Nurse Practitioner

## 2015-03-19 DIAGNOSIS — J0101 Acute recurrent maxillary sinusitis: Secondary | ICD-10-CM

## 2015-03-19 MED ORDER — AMOXICILLIN-POT CLAVULANATE 875-125 MG PO TABS: 1.0000 | ORAL_TABLET | Freq: Two times a day (BID) | ORAL | Status: DC

## 2015-03-19 NOTE — Progress Notes (Signed)

## 2015-04-17 MED FILL — SEB-PREV 10% WASH: 10 | 30 days supply | Qty: 340 | Fill #3

## 2015-04-26 ENCOUNTER — Encounter: Payer: 59 | Admitting: Family Medicine

## 2015-04-30 ENCOUNTER — Encounter: Payer: Self-pay | Admitting: Family Medicine

## 2015-04-30 ENCOUNTER — Ambulatory Visit (INDEPENDENT_AMBULATORY_CARE_PROVIDER_SITE_OTHER): Payer: 59 | Admitting: Family Medicine

## 2015-04-30 VITALS — BP 120/82 | HR 63 | Temp 98.0°F | Ht 68.0 in | Wt 162.1 lb

## 2015-04-30 DIAGNOSIS — R030 Elevated blood-pressure reading, without diagnosis of hypertension: Secondary | ICD-10-CM

## 2015-04-30 DIAGNOSIS — K219 Gastro-esophageal reflux disease without esophagitis: Secondary | ICD-10-CM | POA: Diagnosis not present

## 2015-04-30 DIAGNOSIS — E782 Mixed hyperlipidemia: Secondary | ICD-10-CM | POA: Diagnosis not present

## 2015-04-30 DIAGNOSIS — IMO0001 Reserved for inherently not codable concepts without codable children: Secondary | ICD-10-CM

## 2015-04-30 DIAGNOSIS — C9110 Chronic lymphocytic leukemia of B-cell type not having achieved remission: Secondary | ICD-10-CM

## 2015-04-30 LAB — LIPID PANEL
CHOL/HDL RATIO: 4
Cholesterol: 226 mg/dL — ABNORMAL HIGH (ref 0–200)
HDL: 58 mg/dL (ref >39.00)
LDL CALC: 142 mg/dL — AB (ref 0–99)
NONHDL: 167.67
Triglycerides: 129 mg/dL (ref 0.0–149.0)
VLDL: 25.8 mg/dL (ref 0.0–40.0)

## 2015-04-30 NOTE — Assessment & Plan Note (Signed)
Well controlled. Encouraged heart healthy diet such as the DASH diet and exercise as tolerated.  

## 2015-04-30 NOTE — Progress Notes (Signed)
Pre visit review using our clinic review tool, if applicable. No additional management support is needed unless otherwise documented below in the visit note. 

## 2015-04-30 NOTE — Assessment & Plan Note (Signed)
Avoid offending foods, start probiotics. Do not eat large meals in late evening and consider raising head of bed. Using Nexium qod with good results cannot go to every 3rd.Marland Kitchen

## 2015-04-30 NOTE — Patient Instructions (Signed)
NOW company probiotic 10 strain daily, [email or web address]  Encouraged good sleep hygiene such as dark, quiet room. No blue/green glowing lights such as computer screens in bedroom. No alcohol or stimulants in evening. Cut down on caffeine as able. Regular exercise is helpful but not just prior to bed time.  Melatonin 2-10 mg qhs   Cholesterol Cholesterol is a white, waxy, fat-like substance needed by your body in small amounts. The liver makes all the cholesterol you need. Cholesterol is carried from the liver by the blood through the blood vessels. Deposits of cholesterol (plaque) may build up on blood vessel walls. These make the arteries narrower and stiffer. Cholesterol plaques increase the risk for heart attack and stroke.  You cannot feel your cholesterol level even if it is very high. The only way to know it is high is with a blood test. Once you know your cholesterol levels, you should keep a record of the test results. Work with your health care provider to keep your levels in the desired range.  WHAT DO THE RESULTS MEAN?  Total cholesterol is a rough measure of all the cholesterol in your blood.   LDL is the so-called bad cholesterol. This is the type that deposits cholesterol in the walls of the arteries. You want this level to be low.   HDL is the good cholesterol because it cleans the arteries and carries the LDL away. You want this level to be high.  Triglycerides are fat that the body can either burn for energy or store. High levels are closely linked to heart disease.  WHAT ARE THE DESIRED LEVELS OF CHOLESTEROL?  Total cholesterol below 200.   LDL below 100 for people at risk, below 70 for those at very high risk.   HDL above 50 is good, above 60 is best.   Triglycerides below 150.  HOW CAN I LOWER MY CHOLESTEROL?  Diet. Follow your diet programs as directed by your health care provider.   Choose fish or white meat chicken and [place], roasted or baked. Limit  fatty cuts of red meat, fried foods, and processed meats, such as sausage and lunch meats.   Eat lots of fresh fruits and vegetables.  Choose whole grains, beans, pasta, potatoes, and cereals.   Use only small amounts of olive, corn, or canola oils.   Avoid butter, mayonnaise, shortening, or palm kernel oils.  Avoid foods with trans fats.   Drink skim or nonfat milk and eat low-fat or nonfat yogurt and cheeses. Avoid whole milk, cream, ice cream, egg yolks, and full-fat cheeses.   Healthy desserts include angel food cake, ginger snaps, animal crackers, hard candy, popsicles, and low-fat or nonfat frozen yogurt. Avoid pastries, cakes, pies, and cookies.   Exercise. Follow your exercise programs as directed by your health care provider.   A regular program helps decrease LDL and raise HDL.   A regular program helps with weight control.   Do things that increase your activity level like gardening, walking, or taking the stairs. Ask your health care provider about how you can be more active in your daily life.   Medicine. Take medicine only as directed by your health care provider.   Medicine may be prescribed by your health care provider to help lower cholesterol and decrease the risk for heart disease.   If you have several risk factors, you may need medicine even if your levels are normal.   This information is not intended to replace advice given to you by your  health care provider. Make sure you discuss any questions you have with your health care provider.   Document Released: 12/03/2000 Document Revised: 03/31/2014 Document Reviewed: 12/22/2012 Elsevier Interactive Patient Education [employer].

## 2015-04-30 NOTE — Assessment & Plan Note (Signed)
Tolerating statin, encouraged heart healthy diet, avoid trans fats, minimize simple carbs and saturated fats. Increase exercise as tolerated. Crestor daily, check Lipids today

## 2015-04-30 NOTE — Progress Notes (Signed)
Patient ID: George [name], male   DOB: 06-06-1950, 65 y.o.   MRN: [id number]   Subjective:    Patient ID: George [name], male    DOB: 10-Oct-1950, 65 y.o.   MRN: [id number]  Chief Complaint  Patient presents with  . Medication Refill    HPI Patient is in today for follow-up. Is feeling well. Denies any recent illness. Continues to follow with oncology but no new concerns. Denies CP/palp/SOB/HA/congestion/fevers/GI or GU c/o. Taking meds as prescribed  Past Medical History  Diagnosis Date  . High cholesterol   . GERD (gastroesophageal reflux disease)   . History of chicken pox     childhood  . Colon polyp   . 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 Preventative health care 10/23/2013  . Tremor of right hand 04/17/2014  . Elevated BP 04/23/2014    Past Surgical History  Procedure Laterality Date  . Hernia repair      right groin  . Esophageal dilation  2005    [facility]  . Colonoscopy    . Polypectomy      Family History  Problem Relation Age of Onset  . Hyperlipidemia Mother   . Hypertension Mother     mother-father  . Pulmonary fibrosis Mother   . Prostate cancer Neg Hx   . Breast cancer Neg Hx   . Colon cancer Neg Hx   . Diabetes      mother -father  . Diabetes Father   . Heart disease Father     congestive heart failure  . Other Father     brain tumor  . Mental illness Sister     depression  . GER disease Son   . Stroke Maternal Grandmother   . Diabetes Maternal Grandmother     Social History   Social History  . Marital Status: Single    Spouse Name: N/A  . Number of Children: N/A  . Years of Education: N/A   Occupational History  . Not on file.   Social History Main Topics  . Smoking status: Former Smoker -- 1.00 packs/day for 22 years    Types: Cigarettes    Start date: 02/05/1970    Quit date: 03/07/1992  . Smokeless tobacco: Never Used     Comment: quit 21 years ago  . Alcohol Use: 3.6 oz/week    6 Cans of beer per week   Comment: social  . Drug Use: No  . Sexual Activity: Yes     Comment: lives with wife, no dietary restirctions   Other Topics Concern  . Not on file   Social History Narrative    Outpatient Prescriptions Prior to Visit  Medication Sig Dispense Refill  . aspirin 81 MG EC tablet Take 81 mg by mouth daily.      . cholecalciferol (VITAMIN D) 1000 UNITS tablet Take 1,000 Units by mouth daily. 2 tablets daily    . Coenzyme Q10 (CO Q 10 PO) Take by mouth every morning.     Marland Kitchen esomeprazole (NEXIUM) 40 MG capsule Take 1 capsule (40 mg total) by mouth daily as needed. 90 capsule 1  . GLUCOSAMINE HCL PO Take by mouth every morning.     Nyoka Cowden Tea, Camillia sinensis, (GREEN TEA PO) Take 315 mg by mouth every morning.     . lactobacillus acidophilus (BACID) TABS tablet Take 1 tablet by mouth daily.     . Multiple Vitamin (MULTI-VITAMIN PO) Take by mouth every morning.     Marland Kitchen  NIACIN CR PO Take 500 mg by mouth daily.     . Omega-3 Fatty Acids (FISH OIL CONCENTRATE PO) Take by mouth every morning.     . psyllium (METAMUCIL) 58.6 % powder Take 1 packet by mouth 2 (two) times daily.     . rosuvastatin (CRESTOR) 40 MG tablet Take 1 tablet (40 mg total) by mouth daily. 90 tablet 1  . SEB-PREV WASH 10 % LIQD   3  . Zinc 30 MG TABS Take by mouth every morning.    Marland Kitchen amoxicillin-clavulanate (AUGMENTIN) 875-125 MG tablet Take 1 tablet by mouth 2 (two) times daily. 20 tablet 0   No facility-administered medications prior to visit.    No Known Allergies  Review of Systems  Constitutional: Negative for fever and malaise/fatigue.  HENT: Negative for congestion.   Eyes: Negative for discharge.  Respiratory: Negative for shortness of breath.   Cardiovascular: Negative for chest pain, palpitations and leg swelling.  Gastrointestinal: Negative for nausea and abdominal pain.  Genitourinary: Negative for dysuria.  Musculoskeletal: Negative for falls.  Skin: Negative for rash.  Neurological: Negative for loss  of consciousness and headaches.  Endo/Heme/Allergies: Negative for environmental allergies.  Psychiatric/Behavioral: Negative for depression. The patient is not nervous/anxious.        Objective:    Physical Exam  Constitutional: He is oriented to person, place, and time. He appears well-developed and well-nourished. No distress.  HENT:  Head: Normocephalic and atraumatic.  Nose: Nose normal.  Eyes: Right eye exhibits no discharge. Left eye exhibits no discharge.  Neck: Normal range of motion. Neck supple.  Cardiovascular: Normal rate and regular rhythm.   No murmur heard. Pulmonary/Chest: Effort normal and breath sounds normal.  Abdominal: Soft. Bowel sounds are normal. There is no tenderness.  Musculoskeletal: He exhibits no edema.  Neurological: He is alert and oriented to person, place, and time.  Skin: Skin is warm and dry.  Psychiatric: He has a normal mood and affect.  Nursing note and vitals reviewed.   BP 120/82 mmHg  Pulse 63  Temp(Src) 98 F (36.7 C) (Oral)  Ht 5\' 8"  (1.727 m)  Wt 162 lb 2 oz (73.539 kg)  BMI 24.66 kg/m2  SpO2 98% Wt Readings from Last 3 Encounters:  04/30/15 162 lb 2 oz (73.539 kg)  03/07/15 161 lb (73.029 kg)  12/11/14 157 lb 8 oz (71.442 kg)     Lab Results  Component Value Date   WBC 53.5* 03/07/2015   HGB 13.9 03/07/2015   HCT 42.3 03/07/2015   PLT 214 03/07/2015   GLUCOSE 75 12/11/2014   CHOL 202* 12/11/2014   TRIG 145.0 12/11/2014   HDL 56.70 12/11/2014   LDLCALC 116* 12/11/2014   ALT 17 12/11/2014   AST 23 12/11/2014   NA 139 12/11/2014   K 4.2 12/11/2014   CL 103 12/11/2014   CREATININE 0.74 12/11/2014   BUN 10 12/11/2014   CO2 25 12/11/2014   TSH 1.40 12/11/2014   PSA 0.53 10/01/2011    Lab Results  Component Value Date   TSH 1.40 12/11/2014   Lab Results  Component Value Date   WBC 53.5* 03/07/2015   HGB 13.9 03/07/2015   HCT 42.3 03/07/2015   MCV 93 03/07/2015   PLT 214 03/07/2015   Lab Results    Component Value Date   NA 139 12/11/2014   K 4.2 12/11/2014   CO2 25 12/11/2014   GLUCOSE 75 12/11/2014   BUN 10 12/11/2014   CREATININE 0.74 12/11/2014  BILITOT 1.0 12/11/2014   ALKPHOS 59 12/11/2014   AST 23 12/11/2014   ALT 17 12/11/2014   PROT 7.4 12/11/2014   ALBUMIN 4.6 12/11/2014   CALCIUM 9.6 12/11/2014   GFR 113.38 12/11/2014   Lab Results  Component Value Date   CHOL 202* 12/11/2014   Lab Results  Component Value Date   HDL 56.70 12/11/2014   Lab Results  Component Value Date   LDLCALC 116* 12/11/2014   Lab Results  Component Value Date   TRIG 145.0 12/11/2014   Lab Results  Component Value Date   CHOLHDL 4 12/11/2014   No results found for: HGBA1C     Assessment & Plan:   Problem List Items Addressed This Visit    CLL (chronic lymphocytic leukemia) ([place])    Feels well. Follows with oncology.       Elevated BP    Well controlled. Encouraged heart healthy diet such as the DASH diet and exercise as tolerated.       GERD (gastroesophageal reflux disease) - Primary    Avoid offending foods, start probiotics. Do not eat large meals in late evening and consider raising head of bed. Using Nexium qod with good results cannot go to every 3rd.Marland Kitchen       Hyperlipidemia, mixed    Tolerating statin, encouraged heart healthy diet, avoid trans fats, minimize simple carbs and saturated fats. Increase exercise as tolerated. Crestor daily, check Lipids today      Relevant Orders   Lipid panel      I have discontinued Mr. Tomson amoxicillin-clavulanate. I am also having him maintain his aspirin, Coenzyme Q10 (CO Q 10 PO), NIACIN CR PO, GLUCOSAMINE HCL PO, Omega-3 Fatty Acids (FISH OIL CONCENTRATE PO), Multiple Vitamin (MULTI-VITAMIN PO), (Green Tea, Camillia sinensis, (GREEN TEA PO)), cholecalciferol, Zinc, lactobacillus acidophilus, psyllium, rosuvastatin, esomeprazole, and SEB-PREV WASH.  No orders of the defined types were placed in this encounter.      Penni Homans, MD

## 2015-04-30 NOTE — Assessment & Plan Note (Signed)
Feels well. Follows with oncology.

## 2015-05-23 ENCOUNTER — Ambulatory Visit (INDEPENDENT_AMBULATORY_CARE_PROVIDER_SITE_OTHER): Payer: 59 | Admitting: Physician Assistant

## 2015-05-23 ENCOUNTER — Encounter: Payer: Self-pay | Admitting: Physician Assistant

## 2015-05-23 VITALS — BP 128/93 | HR 79 | Temp 98.0°F | Ht 68.0 in | Wt 158.6 lb

## 2015-05-23 DIAGNOSIS — J209 Acute bronchitis, unspecified: Secondary | ICD-10-CM | POA: Insufficient documentation

## 2015-05-23 MED ORDER — BENZONATATE 100 MG PO CAPS: 100.0000 mg | ORAL_CAPSULE | Freq: Two times a day (BID) | ORAL | Status: DC | PRN

## 2015-05-23 MED ORDER — AZITHROMYCIN 250 MG PO TABS: ORAL_TABLET | ORAL | Status: DC

## 2015-05-23 MED FILL — BENZONATATE 100 MG CAPSULE: 100 | 10 days supply | Qty: 20 | Fill #0

## 2015-05-23 MED FILL — AZITHROMYCIN 250 MG TABLET: 250 | 5 days supply | Qty: 6 | Fill #0

## 2015-05-23 NOTE — Patient Instructions (Signed)
Take antibiotic (Azithromycin) as directed.  Increase fluids.  Get plenty of rest. Use Mucinex for congestion. Use Tessalon as directed for cough. Take a daily probiotic (I recommend Align or Culturelle, but even Activia Yogurt may be beneficial).  A humidifier placed in the bedroom may offer some relief for a dry, scratchy throat of nasal irritation.  Read information below on acute bronchitis. Please call or return to clinic if symptoms are not improving.  Acute Bronchitis Bronchitis is when the airways that extend from the windpipe into the lungs get red, puffy, and painful (inflamed). Bronchitis often causes thick spit (mucus) to develop. This leads to a cough. A cough is the most common symptom of bronchitis. In acute bronchitis, the condition usually begins suddenly and goes away over time (usually in 2 weeks). Smoking, allergies, and asthma can make bronchitis worse. Repeated episodes of bronchitis may cause more lung problems.  HOME CARE  Rest.  Drink enough fluids to keep your pee (urine) clear or pale yellow (unless you need to limit fluids as told by your doctor).  Only take over-the-counter or prescription medicines as told by your doctor.  Avoid smoking and secondhand smoke. These can make bronchitis worse. If you are a smoker, think about using nicotine gum or skin patches. Quitting smoking will help your lungs heal faster.  Reduce the chance of getting bronchitis again by:  Washing your hands often.  Avoiding people with cold symptoms.  Trying not to touch your hands to your mouth, nose, or eyes.  Follow up with your doctor as told.  GET HELP IF: Your symptoms do not improve after 1 week of treatment. Symptoms include:  Cough.  Fever.  Coughing up thick spit.  Body aches.  Chest congestion.  Chills.  Shortness of breath.  Sore throat.  GET HELP RIGHT AWAY IF:   You have an increased fever.  You have chills.  You have severe shortness of breath.  You  have bloody thick spit (sputum).  You throw up (vomit) often.  You lose too much body fluid (dehydration).  You have a severe headache.  You faint.  MAKE SURE YOU:   Understand these instructions.  Will watch your condition.  Will get help right away if you are not doing well or get worse. Document Released: 08/27/2007 Document Revised: 11/10/2012 Document Reviewed: 08/31/2012 ExitCare Patient Information 2015 [employer]. This information is not intended to replace advice given to you by your health care provider. Make sure you discuss any questions you have with your health care provider.   

## 2015-05-23 NOTE — Progress Notes (Signed)
Pre visit review using our clinic review tool, if applicable. No additional management support is needed unless otherwise documented below in the visit note. 

## 2015-05-23 NOTE — Progress Notes (Signed)
  Subjective:     George [name] is a 65 y.o. male who presents for evaluation of symptoms of a URI. Symptoms include congestion, no  fever, productive cough with  green colored sputum, sinus pressure and sore throat. Onset of symptoms was 1.5 weeks ago, and has been gradually worsening since that time. Patient denies history of asthma or COPD. Treatment to date: cough suppressants and decongestants.  The following portions of the patient's history were reviewed and updated as appropriate: allergies, current medications, past family history, past medical history, past social history, past surgical history and problem list.  Review of Systems Pertinent items are noted in HPI.   Objective:    BP 128/93 mmHg  Pulse 79  Temp(Src) 98 F (36.7 C) (Oral)  Ht 5\' 8"  (1.727 m)  Wt 158 lb 9.6 oz (71.94 kg)  BMI 24.12 kg/m2  SpO2 98% General appearance: alert, cooperative, appears stated age and no distress Head: Normocephalic, without obvious abnormality, atraumatic Ears: normal TM's and external ear canals both ears Nose: Nares normal. Septum midline. Mucosa normal. No drainage or sinus tenderness. Throat: lips, mucosa, and tongue normal; teeth and gums normal Lungs: clear to auscultation bilaterally Heart: regular rate and rhythm, S1, S2 normal, no murmur, click, rub or gallop Lymph nodes: Cervical, supraclavicular, and axillary nodes normal.   Assessment:    bronchitis and viral upper respiratory illness   Plan:    Discussed diagnosis and treatment of URI. Suggested symptomatic OTC remedies. Nasal saline spray for congestion. Zithromax per orders. Follow up as needed.

## 2015-05-24 ENCOUNTER — Other Ambulatory Visit: Payer: Self-pay | Admitting: Family Medicine

## 2015-05-24 MED FILL — ESOMEPRAZOLE MAG DR 40 MG C: 40 | 90 days supply | Qty: 90 | Fill #1

## 2015-05-24 MED FILL — ROSUVASTATIN CALCIUM 40 MG: 40 | 90 days supply | Qty: 90 | Fill #0

## 2015-05-25 ENCOUNTER — Ambulatory Visit: Payer: 59 | Admitting: Physician Assistant

## 2015-05-29 ENCOUNTER — Telehealth: Payer: Self-pay | Admitting: Physician Assistant

## 2015-05-29 DIAGNOSIS — J209 Acute bronchitis, unspecified: Secondary | ICD-10-CM

## 2015-05-30 MED ORDER — AZITHROMYCIN 250 MG PO TABS: ORAL_TABLET | ORAL | Status: DC

## 2015-05-30 MED FILL — AZITHROMYCIN 250 MG TABLET: 250 | 5 days supply | Qty: 6 | Fill #0

## 2015-05-30 NOTE — Telephone Encounter (Signed)
Pt called in to check the status of his refill.   Please confirm with pt once refill is complete    CB: [phone]    Thanks.

## 2015-05-30 NOTE — Telephone Encounter (Signed)
Called and spoke with the pt and talked to him regarding the med refill request.  Pt stated that he would like a refill on the Zithromax.  He said that in the mornings he's having some congestion, but it's gone by 11:00am.  Verbally informed Einar Pheasant of the note.  Per Einar Pheasant did the pt get better after taking the first round of Zithromax.   Asked the pt and if he got better after taking the Zithromax the first time.  Pt stated that he did, but he feels he needs a little something more to get him over.  Verbally informed Einar Pheasant of the note and he stated that he will approve the Zithromax this time, but if he continues to have symptoms he will need to come in for a office visit.  Informed pt of Cody's message.  Pt verbalized understanding and agreed.  Rx sent to the pharmacy by e-script.//AB/CMA

## 2015-05-30 NOTE — Telephone Encounter (Signed)
Called and [facility] @ 1:52pm @ [phone]) asking the pt to RTC regarding note below.//AB/CMA

## 2015-08-17 MED FILL — SEB-PREV 10% WASH: 10 | 30 days supply | Qty: 340 | Fill #0

## 2015-08-21 ENCOUNTER — Encounter: Payer: Self-pay | Admitting: Family Medicine

## 2015-08-21 ENCOUNTER — Ambulatory Visit (INDEPENDENT_AMBULATORY_CARE_PROVIDER_SITE_OTHER): Payer: 59 | Admitting: Family Medicine

## 2015-08-21 VITALS — BP 139/89 | HR 61 | Resp 16 | Ht 68.5 in | Wt 159.0 lb

## 2015-08-21 DIAGNOSIS — C9110 Chronic lymphocytic leukemia of B-cell type not having achieved remission: Secondary | ICD-10-CM | POA: Diagnosis not present

## 2015-08-21 DIAGNOSIS — E782 Mixed hyperlipidemia: Secondary | ICD-10-CM

## 2015-08-21 DIAGNOSIS — K219 Gastro-esophageal reflux disease without esophagitis: Secondary | ICD-10-CM | POA: Diagnosis not present

## 2015-08-21 NOTE — Assessment & Plan Note (Addendum)
Will continue with oncology Dr. yearly.

## 2015-08-21 NOTE — Assessment & Plan Note (Signed)
Failed Pantoprazole apparently in the past. Well controlled on current med.  Refill as needed.

## 2015-08-21 NOTE — Progress Notes (Signed)
Marjory Sneddon, D.O. Family Medicine Physician [facility] Location: Primary Care at [facility]     Subjective:    CC: New pt, here to establish care.   HPI: George [name] is a pleasant 65 y.o. male who presents to [place] [facility] today To establish care. He has no complaints today.  He takes Crestor for his cholesterol. He denies any generalized myalgias or problems taking the medicines. He tells me is very compliant. She also takes Nexium daily for his reflux which controls it very well. He was diagnosed with CLL approximately 10 years ago and sees Dr. Marin Olp in oncology over at [place] [facility].     His last labs were approximate 6 months ago and he is due for his annual physical this August.  Patient has 5 sisters which are all live close to him. He has one son from a prior marriage. He is retired- age 19 and spends his days working around the yard and road bikes at least 2 hours every day for his cardiovascular fitness. He enjoys traveling to the [place] and does a lot of hiking and mountain biking.  Past Medical History  Diagnosis Date  . High cholesterol   . GERD (gastroesophageal reflux disease)   . History of chicken pox     childhood  . Colon polyp   . Preventative health care 10/23/2013  . Tremor of right hand 04/17/2014  . Elevated BP 04/23/2014  . 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 Hernia repair      right groin  . Esophageal dilation  2005    [facility]  . Colonoscopy    . Polypectomy      Family History  Problem Relation Age of Onset  . Hyperlipidemia Mother   . Hypertension Mother     mother-father  . Pulmonary fibrosis Mother   . Prostate cancer Neg Hx   . Breast cancer Neg Hx   . Colon cancer Neg Hx   . Diabetes      mother -father  . Diabetes Father   . Heart disease Father     congestive heart failure  . Other Father      brain tumor  . Mental illness Sister     depression  . GER disease Son   . Stroke Maternal Grandmother   . Diabetes Maternal Grandmother     History  Drug Use No  ,  History  Alcohol Use  . 3.6 oz/week  . 6 Cans of beer per week    Comment: social  ,  History  Smoking status  . Former Smoker -- 1.00 packs/day for 22 years  . Types: Cigarettes  . Start date: 02/05/1970  . Quit date: 03/07/1992  Smokeless tobacco  . Never Used    Comment: quit 21 years ago  ,  History  Sexual Activity  . Sexual Activity: Yes    Comment: lives with wife, no dietary restirctions    Patient's Medications  New Prescriptions   No medications on file  Previous Medications   ASPIRIN 81 MG EC TABLET    Take 81 mg by mouth daily.     CHOLECALCIFEROL (VITAMIN D) 1000 UNITS TABLET    Take 1,000 Units by mouth daily. 2 tablets daily   COENZYME Q10 (CO Q 10 PO)    Take by mouth every morning.  ESOMEPRAZOLE (NEXIUM) 40 MG CAPSULE    Take 1 capsule (40 mg total) by mouth daily as needed.   GLUCOSAMINE HCL PO    Take by mouth every morning.    GREEN TEA, CAMILLIA SINENSIS, (GREEN TEA PO)    Take 315 mg by mouth every morning.    LACTOBACILLUS ACIDOPHILUS (BACID) TABS TABLET    Take 1 tablet by mouth daily.    MULTIPLE VITAMIN (MULTI-VITAMIN PO)    Take by mouth every morning.    NIACIN CR PO    Take 500 mg by mouth daily.    OMEGA-3 FATTY ACIDS (FISH OIL CONCENTRATE PO)    Take by mouth every morning.    PSYLLIUM (METAMUCIL) 58.6 % POWDER    Take 1 packet by mouth 2 (two) times daily.    ROSUVASTATIN (CRESTOR) 40 MG TABLET    TAKE 1 TABLET BY MOUTH ONCE DAILY   SEB-PREV WASH 10 % LIQD       ZINC 30 MG TABS    Take by mouth every morning.  Modified Medications   No medications on file  Discontinued Medications   AZITHROMYCIN (ZITHROMAX) 250 MG TABLET    Take 2 tablets on Day 1. Then take 1 tablet daily   BENZONATATE (TESSALON) 100 MG CAPSULE    Take 1 capsule (100 mg total) by mouth 2 (two)  times daily as needed for cough.    Review of patient's allergies indicates no known allergies.   Review of Systems: Full 14 point ROS performed via "adult medical history form".  Entirely Negative.   Objective:   Blood pressure 139/89, pulse 61, resp. rate 16, height 5' 8.5" (1.74 m), weight 159 lb (72.122 kg), SpO2 98 %.  General: Well Developed, well nourished, and in no acute distress.  Neuro: Alert and oriented x3, extra-ocular muscles intact, sensation grossly intact.  HEENT: Normocephalic, atraumatic, pupils equal round reactive to light, neck supple, no gross masses, no carotid bruits, no JVD apprec Skin: no gross suspicious lesions or rashes  Cardiac: Regular rate and rhythm, no murmurs rubs or gallops.  Respiratory: Essentially clear to auscultation bilaterally. Not using accessory muscles, speaking in full sentences.  Abdominal: Soft, not grossly distended Musculoskeletal: Ambulates w/o diff, FROM * 4 ext.  Vasc: less 2 sec cap RF, warm and pink  Psych:  No HI/SI, judgement and insight good.    Impression and Recommendations:    The patient was counselled, risk factors were discussed, anticipatory guidance given.  GERD (gastroesophageal reflux disease) Failed Pantoprazole apparently in the past. Well controlled on current med.  Refill as needed.  CLL (chronic lymphocytic leukemia) Will continue with oncology Dr. yearly.  Hyperlipidemia, mixed --- Continue Crestor. We will obtain labs 1 week prior to his physical in early August.   --- Obtain CBC, CMP, A1c, fasting lipid profile, TSH, vitamin D, B12 and PSA.

## 2015-08-21 NOTE — Assessment & Plan Note (Signed)
---   Continue Crestor. We will obtain labs 1 week prior to his physical in early August.   --- Obtain CBC, CMP, A1c, fasting lipid profile, TSH, vitamin D, B12 and PSA.

## 2015-09-13 MED FILL — ROSUVASTATIN CALCIUM 40 MG: 40 | 90 days supply | Qty: 90 | Fill #1

## 2015-09-19 ENCOUNTER — Other Ambulatory Visit: Payer: Self-pay | Admitting: Family Medicine

## 2015-09-19 MED FILL — ESOMEPRAZOLE MAG DR 40 MG C: 40 | 90 days supply | Qty: 90 | Fill #0

## 2015-10-15 ENCOUNTER — Other Ambulatory Visit (INDEPENDENT_AMBULATORY_CARE_PROVIDER_SITE_OTHER): Payer: 59

## 2015-10-15 ENCOUNTER — Other Ambulatory Visit: Payer: Self-pay

## 2015-10-15 DIAGNOSIS — E785 Hyperlipidemia, unspecified: Secondary | ICD-10-CM

## 2015-10-15 DIAGNOSIS — Z125 Encounter for screening for malignant neoplasm of prostate: Secondary | ICD-10-CM

## 2015-10-15 DIAGNOSIS — R030 Elevated blood-pressure reading, without diagnosis of hypertension: Secondary | ICD-10-CM

## 2015-10-15 DIAGNOSIS — IMO0001 Reserved for inherently not codable concepts without codable children: Secondary | ICD-10-CM

## 2015-10-15 DIAGNOSIS — Z1321 Encounter for screening for nutritional disorder: Secondary | ICD-10-CM

## 2015-10-15 DIAGNOSIS — Z131 Encounter for screening for diabetes mellitus: Secondary | ICD-10-CM

## 2015-10-15 DIAGNOSIS — C9110 Chronic lymphocytic leukemia of B-cell type not having achieved remission: Secondary | ICD-10-CM

## 2015-10-16 LAB — COMPREHENSIVE METABOLIC PANEL
ALK PHOS: 58 U/L (ref 40–115)
ALT: 19 U/L (ref 9–46)
AST: 28 U/L (ref 10–35)
Albumin: 4.7 g/dL (ref 3.6–5.1)
BILIRUBIN TOTAL: 0.8 mg/dL (ref 0.2–1.2)
BUN: 10 mg/dL (ref 7–25)
CO2: 26 mmol/L (ref 20–31)
CREATININE: 0.73 mg/dL (ref 0.70–1.25)
Calcium: 9.5 mg/dL (ref 8.6–10.3)
Chloride: 100 mmol/L (ref 98–110)
GLUCOSE: 81 mg/dL (ref 65–99)
POTASSIUM: 4.7 mmol/L (ref 3.5–5.3)
SODIUM: 136 mmol/L (ref 135–146)
Total Protein: 6.9 g/dL (ref 6.1–8.1)

## 2015-10-16 LAB — CBC WITH DIFFERENTIAL/PLATELET
BASOS PCT: 0 %
Basophils Absolute: 0 cells/uL (ref 0–200)
EOS PCT: 0 %
Eosinophils Absolute: 0 cells/uL — ABNORMAL LOW (ref 15–500)
HCT: 41.9 % (ref 38.5–50.0)
Hemoglobin: 13.8 g/dL (ref 13.2–17.1)
LYMPHS PCT: 90 %
Lymphs Abs: 38520 cells/uL — ABNORMAL HIGH (ref 850–3900)
MCH: 31.5 pg (ref 27.0–33.0)
MCHC: 32.9 g/dL (ref 32.0–36.0)
MCV: 95.7 fL (ref 80.0–100.0)
MONOS PCT: 2 %
MPV: 9.6 fL (ref 7.5–12.5)
Monocytes Absolute: 856 cells/uL (ref 200–950)
NEUTROS ABS: 3424 {cells}/uL (ref 1500–7800)
Neutrophils Relative %: 8 %
PLATELETS: 206 10*3/uL (ref 140–400)
RBC: 4.38 MIL/uL (ref 4.20–5.80)
RDW: 13.6 % (ref 11.0–15.0)
WBC: 42.8 10*3/uL — AB (ref 3.8–10.8)

## 2015-10-16 LAB — LIPID PANEL
Cholesterol: 215 mg/dL — ABNORMAL HIGH (ref 125–200)
HDL: 75 mg/dL (ref >=40)
LDL CALC: 124 mg/dL (ref <130)
TRIGLYCERIDES: 78 mg/dL (ref <150)
Total CHOL/HDL Ratio: 2.9 Ratio (ref <=5.0)
VLDL: 16 mg/dL (ref <30)

## 2015-10-16 LAB — VITAMIN D 25 HYDROXY (VIT D DEFICIENCY, FRACTURES): VIT D 25 HYDROXY: 55 ng/mL (ref 30–100)

## 2015-10-16 LAB — HEMOGLOBIN A1C
Hgb A1c MFr Bld: 5.3 % (ref <5.7)
Mean Plasma Glucose: 105 mg/dL

## 2015-10-16 LAB — VITAMIN B12: Vitamin B-12: 443 pg/mL (ref 200–1100)

## 2015-10-16 LAB — PSA: PSA: 0.64 ng/mL (ref <=4.00)

## 2015-10-16 LAB — PATHOLOGIST SMEAR REVIEW

## 2015-10-22 ENCOUNTER — Encounter: Payer: Self-pay | Admitting: Family Medicine

## 2015-10-22 ENCOUNTER — Ambulatory Visit (INDEPENDENT_AMBULATORY_CARE_PROVIDER_SITE_OTHER): Payer: 59 | Admitting: Family Medicine

## 2015-10-22 VITALS — BP 128/76 | HR 59 | Wt 152.4 lb

## 2015-10-22 DIAGNOSIS — K219 Gastro-esophageal reflux disease without esophagitis: Secondary | ICD-10-CM

## 2015-10-22 DIAGNOSIS — Z0000 Encounter for general adult medical examination without abnormal findings: Secondary | ICD-10-CM

## 2015-10-22 DIAGNOSIS — E782 Mixed hyperlipidemia: Secondary | ICD-10-CM

## 2015-10-22 DIAGNOSIS — C9110 Chronic lymphocytic leukemia of B-cell type not having achieved remission: Secondary | ICD-10-CM | POA: Diagnosis not present

## 2015-10-22 NOTE — Progress Notes (Signed)
Male physical  Impression and Recommendations:    1. Encounter for preventive health examination   2. Hyperlipidemia, mixed   3. Gastroesophageal reflux disease, esophagitis presence not specified   4. CLL (chronic lymphocytic leukemia) ([place])     1) Discussed with patient health maintenance issues including eye exams, dental evaluations etc. Patient will follow-up in 6 months for chronic medical conditions and 1 year for yearly physical. 2) patient's cholesterol is even better than prior. His HDL is over 60 and all of his LDL, VLDL and triglycerides are well within normal limits. Continue to exercise and eat a prudent diet.    continue the Crestor 40 mg daily at bedtime 3) GERD symptoms are well controlled. Continue Nexium daily.  4) patient's white blood cell count has been trending downward. Stable. He sees his Hem-onc Dr. Jonette Eva yearly    Patient's Medications  New Prescriptions   No medications on file  Previous Medications   ASPIRIN 81 MG EC TABLET    Take 81 mg by mouth daily.     CHOLECALCIFEROL (VITAMIN D) 1000 UNITS TABLET    Take 1,000 Units by mouth daily. 2 tablets daily   COENZYME Q10 (CO Q 10 PO)    Take by mouth every morning.    ESOMEPRAZOLE (NEXIUM) 40 MG CAPSULE    TAKE 1 CAPSULE BY MOUTH DAILY AS NEEDED.   GLUCOSAMINE HCL PO    Take by mouth every morning.    GREEN TEA, CAMILLIA SINENSIS, (GREEN TEA PO)    Take 315 mg by mouth every morning.    LACTOBACILLUS ACIDOPHILUS (BACID) TABS TABLET    Take 1 tablet by mouth daily.    MULTIPLE VITAMIN (MULTI-VITAMIN PO)    Take by mouth every morning.    NIACIN CR PO    Take 500 mg by mouth daily.    OMEGA-3 FATTY ACIDS (FISH OIL CONCENTRATE PO)    Take by mouth every morning.    PSYLLIUM (METAMUCIL) 58.6 % POWDER    Take 1 packet by mouth 2 (two) times daily.    ROSUVASTATIN (CRESTOR) 40 MG TABLET    TAKE 1 TABLET BY MOUTH ONCE DAILY   SEB-PREV WASH 10 % LIQD       ZINC 30 MG TABS    Take by mouth every morning.    Modified Medications   No medications on file  Discontinued Medications   No medications on file     Please see AVS handed out to patient at the end of our visit for further patient instructions/ counseling done pertaining to today's office visit.  1) Anticipatory Guidance: Discussed importance of wearing a seatbelt while driving, sunscreen when outside along with skin surveillance; eating a balanced and modest diet; physical activity at least 25 minutes per day or 150 min/ week moderate to intense activity- which patient actually gets more of.  2) Immunizations / Screenings / Labs:  All immunizations are up-to-date per recommendations or will be updated today pt permitting.   3) Weight:  BMI does indicate pt needs to maintain weight.   Improve nutrient density of diet through increasing intake of fruits and vegetables and decreasing saturated fats, white flour products and refined sugars.   Gross side effects, risk and benefits, and alternatives of medications discussed with patient.  Patient is aware that all medications have potential side effects and we are unable to predict every side effect or drug-drug interaction that may occur.  Expresses verbal understanding and consents to current therapy  plan and treatment regimen.  Follow-up preventative CPE in 1 year. Follow-up office visit pending lab work.  F/up sooner for chronic care management and/or prn    Subjective:    CC: CPE  HPI: George [name] is a 65 y.o. male who presents to [place] at [facility] today for a yearly health maintenance exam and review in person all recent labs.  Health Maintenance Summary Reviewed and updated, unless pt declines services.  Tobacco History Reviewed: smoked 25-30 yrs ago. Smoked 10 yrs at 0.5 pks per day= 5 pack yr hx Alcohol: No concerns, no excessive use Exercise Habits: rides bike 2 hrs per day STD concerns: none Drug Use: None Birth control method:  n/a Testicular/penile concerns: no Cancer Family History: no Has Derm- does yrly skin screenings Last colonoscopy: 2015, June. Dr. Delfin Edis. Polyp removed, repeat in 5 years.     Patient Active Problem List   Diagnosis Date Noted  . Acute bronchitis 05/23/2015  . Tremor of right hand 04/17/2014  . Encounter for preventive health examination 10/23/2013  . Muscle cramp 04/21/2013  . Hyperlipidemia, mixed 04/21/2013  . CLL (chronic lymphocytic leukemia) ([place]) 10/14/2011  . GERD (gastroesophageal reflux disease) 10/22/2010    Past Medical History:  Diagnosis Date  . CLL (chronic lymphocytic leukemia) ([place]) 2009  . Colon polyp   . Elevated BP 04/23/2014  . GERD (gastroesophageal reflux disease)   . High cholesterol   . History of chicken pox    childhood  . Preventative health care 10/23/2013  . Tremor of right hand 04/17/2014    Past Surgical History:  Procedure Laterality Date  . COLONOSCOPY    . ESOPHAGEAL DILATION  2005   [facility]  . HERNIA REPAIR     right groin  . POLYPECTOMY      Family History  Problem Relation Age of Onset  . Hyperlipidemia Mother   . Hypertension Mother     mother-father  . Pulmonary fibrosis Mother   . Diabetes Father   . Heart disease Father     congestive heart failure  . Other Father     brain tumor  . Mental illness Sister     depression  . GER disease Son   . Stroke Maternal Grandmother   . Diabetes Maternal Grandmother   . Diabetes      mother -father  . Prostate cancer Neg Hx   . Breast cancer Neg Hx   . Colon cancer Neg Hx     History  Drug Use No  ,  History  Alcohol Use  . 3.6 oz/week  . 6 Cans of beer per week    Comment: social  ,  History  Smoking Status  . Former Smoker  . Packs/day: 1.00  . Years: 22.00  . Types: Cigarettes  . Start date: 02/05/1970  . Quit date: 03/07/1992  Smokeless Tobacco  . Never Used    Comment: quit 21 years ago  ,  History  Sexual Activity  . Sexual activity: Yes     Comment: lives with wife, no dietary restirctions    Patient's Medications  New Prescriptions   No medications on file  Previous Medications   ASPIRIN 81 MG EC TABLET    Take 81 mg by mouth daily.     CHOLECALCIFEROL (VITAMIN D) 1000 UNITS TABLET    Take 1,000 Units by mouth daily. 2 tablets daily   COENZYME Q10 (CO Q 10 PO)    Take by mouth  every morning.    ESOMEPRAZOLE (NEXIUM) 40 MG CAPSULE    TAKE 1 CAPSULE BY MOUTH DAILY AS NEEDED.   GLUCOSAMINE HCL PO    Take by mouth every morning.    GREEN TEA, CAMILLIA SINENSIS, (GREEN TEA PO)    Take 315 mg by mouth every morning.    LACTOBACILLUS ACIDOPHILUS (BACID) TABS TABLET    Take 1 tablet by mouth daily.    MULTIPLE VITAMIN (MULTI-VITAMIN PO)    Take by mouth every morning.    NIACIN CR PO    Take 500 mg by mouth daily.    OMEGA-3 FATTY ACIDS (FISH OIL CONCENTRATE PO)    Take by mouth every morning.    PSYLLIUM (METAMUCIL) 58.6 % POWDER    Take 1 packet by mouth 2 (two) times daily.    ROSUVASTATIN (CRESTOR) 40 MG TABLET    TAKE 1 TABLET BY MOUTH ONCE DAILY   SEB-PREV WASH 10 % LIQD       ZINC 30 MG TABS    Take by mouth every morning.  Modified Medications   No medications on file  Discontinued Medications   No medications on file    Review of patient's allergies indicates no known allergies.  Review of Systems:   ( Completed via her adult medical history intake form today ) General:  Denies fever, chills, appetite changes, unexplained weight loss.  Respiratory: Denies SOB, DOE, cough, wheezing.  Cardiovascular: Denies chest pain, palpitations.  Gastrointestinal: Denies nausea, vomiting, diarrhea, abdominal pain.  Genitourinary: Denies dysuria, increased frequency, flank pain. Endocrine: Denies hot or cold intolerance, polyuria, polydipsia. Musculoskeletal: Denies myalgias, back pain, joint swelling, arthralgias, gait problems.  Skin: Denies pallor, rash, suspicious lesions.  Neurological: Denies dizziness, seizures, syncope,  unexplained weakness, lightheadedness, numbness and headaches.  Psychiatric/Behavioral: Denies mood changes, suicidal or homicidal ideations, hallucinations, sleep disturbances.   Objective:    Blood pressure 128/76, pulse (!) 59, weight 152 lb 6.4 oz (69.1 kg).  Body mass index is 22.84 kg/m. General Appearance:    Alert, cooperative, no distress, appears stated age  Head:    Normocephalic, without obvious abnormality, atraumatic  Eyes:    PERRL, conjunctiva/corneas clear, EOM's intact, fundi    benign, both eyes  Ears:    Normal TM's and external ear canals, both ears  Nose:   Nares normal, septum midline, mucosa normal, no drainage    or sinus tenderness  Throat:   Lips w/o lesion, mucosa moist, and tongue normal; teeth and   gums normal  Neck:   Supple, symmetrical, trachea midline, no adenopathy;    thyroid:  no enlargement/tenderness/nodules; no carotid   bruit or JVD  Back:     Symmetric, no curvature, ROM normal, no CVA tenderness  Lungs:     Clear to auscultation bilaterally, respirations unlabored, no       Wh/ R/ R  Chest Wall:    No tenderness or gross deformity; normal excursion   Heart:    Regular rate and rhythm, S1 and S2 normal, no murmur, rub   or gallop  Abdomen:     Soft, non-tender, bowel sounds active all four quadrants, NO   G/R/R, no masses, no organomegaly  Genitalia:    Ext genitalia: without lesion, no penile rash or discharge, no hernias appreciated   Rectal:    Normal tone, prostate WNL's and equal b/l, no tenderness; guaiac negative stool  Extremities:   Extremities normal, atraumatic, no cyanosis or gross edema  Pulses:   2+ and  symmetric all extremities  Skin:   Warm, dry, Skin color, texture, turgor normal, no obvious rashes or lesions  M-Sk:   Ambulates * 4 w/o difficulty, no gross deformities, tone WNL  Neurologic:   CNII-XII intact, normal strength, sensation and reflexes    throughout     Recent Results (from the past 2160 hour(s))  CBC  w/Diff     Status: Abnormal   Collection Time: 10/15/15  9:09 AM  Result Value Ref Range   WBC 42.8 (H) 3.8 - 10.8 K/uL   RBC 4.38 4.20 - 5.80 MIL/uL   Hemoglobin 13.8 13.2 - 17.1 g/dL   HCT 41.9 38.5 - 50.0 %   MCV 95.7 80.0 - 100.0 fL   MCH 31.5 27.0 - 33.0 pg   MCHC 32.9 32.0 - 36.0 g/dL   RDW 13.6 11.0 - 15.0 %   Platelets 206 140 - 400 K/uL   MPV 9.6 7.5 - 12.5 fL   Neutro Abs 3,424 1,500 - 7,800 cells/uL   Lymphs Abs 38,520 (H) 850 - 3,900 cells/uL   Monocytes Absolute 856 200 - 950 cells/uL   Eosinophils Absolute 0 (L) 15 - 500 cells/uL   Basophils Absolute 0 0 - 200 cells/uL   Neutrophils Relative % 8 %   Lymphocytes Relative 90 %   Monocytes Relative 2 %   Eosinophils Relative 0 %   Basophils Relative 0 %   Smear Review       Comment: Atypical lymphs. Pending path review ** Please note change in unit of measure and reference range(s). **   Comp Met (CMET)     Status: None   Collection Time: 10/15/15  9:09 AM  Result Value Ref Range   Sodium 136 135 - 146 mmol/L   Potassium 4.7 3.5 - 5.3 mmol/L   Chloride 100 98 - 110 mmol/L   CO2 26 20 - 31 mmol/L   Glucose, Bld 81 65 - 99 mg/dL   BUN 10 7 - 25 mg/dL   Creat 0.73 0.70 - 1.25 mg/dL    Comment:   For patients > or = 64 years of age: The upper reference limit for Creatinine is approximately 13% higher for people identified as African-American.      Total Bilirubin 0.8 0.2 - 1.2 mg/dL   Alkaline Phosphatase 58 40 - 115 U/L   AST 28 10 - 35 U/L   ALT 19 9 - 46 U/L   Total Protein 6.9 6.1 - 8.1 g/dL   Albumin 4.7 3.6 - 5.1 g/dL   Calcium 9.5 8.6 - 10.3 mg/dL  Hemoglobin A1c     Status: None   Collection Time: 10/15/15  9:09 AM  Result Value Ref Range   Hgb A1c MFr Bld 5.3 <5.7 %    Comment:   For the purpose of screening for the presence of diabetes:   <5.7%       Consistent with the absence of diabetes 5.7-6.4 %   Consistent with increased risk for diabetes (prediabetes) >=6.5 %     Consistent with  diabetes   This assay result is consistent with a decreased risk of diabetes.   Currently, no consensus exists regarding use of hemoglobin A1c for diagnosis of diabetes in children.   According to American Diabetes Association (ADA) guidelines, hemoglobin A1c <7.0% represents optimal control in non-pregnant diabetic patients. Different metrics may apply to specific patient populations. Standards of Medical Care in Diabetes (ADA).      Mean Plasma Glucose 105 mg/dL  Lipid panel     Status: Abnormal   Collection Time: 10/15/15  9:09 AM  Result Value Ref Range   Cholesterol 215 (H) 125 - 200 mg/dL   Triglycerides 78 <150 mg/dL   HDL 75 >=40 mg/dL   Total CHOL/HDL Ratio 2.9 <=5.0 Ratio   VLDL 16 <30 mg/dL   LDL Cholesterol 124 <130 mg/dL    Comment:   Total Cholesterol/HDL Ratio:CHD Risk                        Coronary Heart Disease Risk Table                                        Men       Women          1/2 Average Risk              3.4        3.3              Average Risk              5.0        4.4           2X Average Risk              9.6        7.1           3X Average Risk             23.4       11.0 Use the calculated Patient Ratio above and the CHD Risk table  to determine the patient's CHD Risk.   VITAMIN D 25 Hydroxy (Vit-D Deficiency, Fractures)     Status: None   Collection Time: 10/15/15  9:09 AM  Result Value Ref Range   Vit D, 25-Hydroxy 55 30 - 100 ng/mL    Comment: Vitamin D Status           25-OH Vitamin D        Deficiency                <20 ng/mL        Insufficiency         20 - 29 ng/mL        Optimal             > or = 30 ng/mL   For 25-OH Vitamin D testing on patients on D2-supplementation and patients for whom quantitation of D2 and D3 fractions is required, the QuestAssureD 25-OH VIT D, (D2,D3), LC/MS/MS is recommended: order code [id number] (patients > 2 yrs).   Vitamin B12     Status: None   Collection Time: 10/15/15  9:09 AM  Result Value Ref  Range   Vitamin B-12 443 200 - 1,100 pg/mL  PSA     Status: None   Collection Time: 10/15/15  9:09 AM  Result Value Ref Range   PSA 0.64 <=4.00 ng/mL    Comment: Test Methodology: ECLIA PSA (Electrochemiluminescence Immunoassay)   For PSA values from 2.5-4.0, particularly in younger men <71 years old, the AUA and NCCN suggest testing for % Free PSA (3515) and evaluation of the rate of increase in PSA (PSA velocity).   Pathologist smear review     Status: None   Collection Time: 10/15/15  9:09 AM  Result Value  Ref Range   Path Review SEE NOTE     Comment: Absolute lymphocytosis and atypical lymphocytes, consistent with a lymphoproliferative disorder. Suggest immunophenotyping by flow cytometry for further evaluation. Reviewed by Francis Gaines Mammarappallil MD (Electronic Signature on File) 10/16/2015

## 2015-10-22 NOTE — Patient Instructions (Signed)
Repeat colonoscpy in 2020- since you had polyp.  We will see you in about 6 mo for routine f/up.    High-Fiber Diet Fiber, also called dietary fiber, is a type of carbohydrate found in fruits, vegetables, whole grains, and beans. A high-fiber diet can have many health benefits. Your health care provider may recommend a high-fiber diet to help:  Prevent constipation. Fiber can make your bowel movements more regular.  Lower your cholesterol.  Relieve hemorrhoids, uncomplicated diverticulosis, or irritable bowel syndrome.  Prevent overeating as part of a weight-loss plan.  Prevent heart disease, type 2 diabetes, and certain cancers. WHAT IS MY PLAN? The recommended daily intake of fiber includes:  38 grams for men under age 25.  16 grams for men over age 27.  57 grams for women under age 64.  34 grams for women over age 82. You can get the recommended daily intake of dietary fiber by eating a variety of fruits, vegetables, grains, and beans. Your health care provider may also recommend a fiber supplement if it is not possible to get enough fiber through your diet. WHAT DO I NEED TO KNOW ABOUT A HIGH-FIBER DIET?  Fiber supplements have not been widely studied for their effectiveness, so it is better to get fiber through food sources.  Always check the fiber content on thenutrition facts label of any prepackaged food. Look for foods that contain at least 5 grams of fiber per serving.  Ask your dietitian if you have questions about specific foods that are related to your condition, especially if those foods are not listed in the following section.  Increase your daily fiber consumption gradually. Increasing your intake of dietary fiber too quickly may cause bloating, cramping, or gas.  Drink plenty of water. Water helps you to digest fiber. WHAT FOODS CAN I EAT? Grains Whole-grain breads. Multigrain cereal. Oats and oatmeal. Brown rice. Barley. Bulgur wheat. [place]. Bran muffins.  Popcorn. Rye wafer crackers. Vegetables Sweet potatoes. Spinach. Kale. Artichokes. Cabbage. Broccoli. Green peas. Carrots. Squash. Fruits Berries. Pears. Apples. Oranges. Avocados. Prunes and raisins. Dried figs. Meats and Other Protein Sources Navy, kidney, pinto, and soy beans. Split peas. Lentils. Nuts and seeds. Dairy Fiber-fortified yogurt. Beverages Fiber-fortified soy milk. Fiber-fortified orange juice. Other Fiber bars. The items listed above may not be a complete list of recommended foods or beverages. Contact your dietitian for more options. WHAT FOODS ARE NOT RECOMMENDED? Grains White bread. Pasta made with refined flour. White rice. Vegetables Fried potatoes. Canned vegetables. Well-cooked vegetables.  Fruits Fruit juice. Cooked, strained fruit. Meats and Other Protein Sources Fatty cuts of meat. Fried Sales executive or fried fish. Dairy Milk. Yogurt. Cream cheese. Sour cream. Beverages Soft drinks. Other Cakes and pastries. Butter and oils. The items listed above may not be a complete list of foods and beverages to avoid. Contact your dietitian for more information. WHAT ARE SOME TIPS FOR INCLUDING HIGH-FIBER FOODS IN MY DIET?  Eat a wide variety of high-fiber foods.  Make sure that half of all grains consumed each day are whole grains.  Replace breads and cereals made from refined flour or white flour with whole-grain breads and cereals.  Replace white rice with brown rice, bulgur wheat, or millet.  Start the day with a breakfast that is high in fiber, such as a cereal that contains at least 5 grams of fiber per serving.  Use beans in place of meat in soups, salads, or pasta.  Eat high-fiber snacks, such as berries, raw vegetables, nuts, or  popcorn.   This information is not intended to replace advice given to you by your health care provider. Make sure you discuss any questions you have with your health care provider.   Document Released: 03/10/2005 Document  Revised: 03/31/2014 Document Reviewed: 08/23/2013 Elsevier Interactive Patient Education [employer].

## 2015-10-23 ENCOUNTER — Encounter: Payer: 59 | Admitting: Family Medicine

## 2015-12-26 ENCOUNTER — Ambulatory Visit (INDEPENDENT_AMBULATORY_CARE_PROVIDER_SITE_OTHER): Payer: 59 | Admitting: Family Medicine

## 2015-12-26 VITALS — Temp 98.2°F

## 2015-12-26 DIAGNOSIS — Z23 Encounter for immunization: Secondary | ICD-10-CM | POA: Diagnosis not present

## 2015-12-26 NOTE — Progress Notes (Signed)
   Subjective:    Patient ID: George [name], male    DOB: 07/13/1950, 65 y.o.   MRN: [id number]  HPI  George [name] is here for a flu vaccine.  Review of Systems     Objective:   Physical Exam        Assessment & Plan:  Patient tolerated injection well without complications. Patient advised to return in 1 year for another flu vaccine.

## 2016-01-01 MED FILL — ROSUVASTATIN CALCIUM 40 MG: 40 | 90 days supply | Qty: 90 | Fill #2

## 2016-01-01 MED FILL — ESOMEPRAZOLE MAG DR 40 MG C: 40 | 90 days supply | Qty: 90 | Fill #1

## 2016-02-27 DIAGNOSIS — L821 Other seborrheic keratosis: Secondary | ICD-10-CM | POA: Diagnosis not present

## 2016-02-27 DIAGNOSIS — L814 Other melanin hyperpigmentation: Secondary | ICD-10-CM | POA: Diagnosis not present

## 2016-02-27 DIAGNOSIS — D225 Melanocytic nevi of trunk: Secondary | ICD-10-CM | POA: Diagnosis not present

## 2016-02-27 DIAGNOSIS — D1800 Hemangioma unspecified site: Secondary | ICD-10-CM | POA: Diagnosis not present

## 2016-02-27 DIAGNOSIS — Z23 Encounter for immunization: Secondary | ICD-10-CM | POA: Diagnosis not present

## 2016-03-05 ENCOUNTER — Ambulatory Visit (HOSPITAL_BASED_OUTPATIENT_CLINIC_OR_DEPARTMENT_OTHER): Payer: 59 | Admitting: Hematology & Oncology

## 2016-03-05 ENCOUNTER — Ambulatory Visit (HOSPITAL_BASED_OUTPATIENT_CLINIC_OR_DEPARTMENT_OTHER): Payer: 59

## 2016-03-05 VITALS — BP 156/100 | HR 68 | Temp 98.1°F | Resp 20 | Ht 68.5 in | Wt 158.8 lb

## 2016-03-05 DIAGNOSIS — C9110 Chronic lymphocytic leukemia of B-cell type not having achieved remission: Secondary | ICD-10-CM

## 2016-03-05 DIAGNOSIS — S30861A Insect bite (nonvenomous) of abdominal wall, initial encounter: Secondary | ICD-10-CM | POA: Diagnosis not present

## 2016-03-05 DIAGNOSIS — W57XXXA Bitten or stung by nonvenomous insect and other nonvenomous arthropods, initial encounter: Secondary | ICD-10-CM

## 2016-03-05 LAB — CBC WITH DIFFERENTIAL (CANCER CENTER ONLY)
HEMATOCRIT: 42.7 % (ref 38.7–49.9)
HEMOGLOBIN: 14.1 g/dL (ref 13.0–17.1)
MCH: 30.9 pg (ref 28.0–33.4)
MCHC: 33 g/dL (ref 32.0–35.9)
MCV: 94 fL (ref 82–98)
Platelets: 241 10*3/uL (ref 145–400)
RBC: 4.56 10*6/uL (ref 4.20–5.70)
RDW: 13 % (ref 11.1–15.7)
WBC: 50 10*3/uL — ABNORMAL HIGH (ref 4.0–10.0)

## 2016-03-05 LAB — MANUAL DIFFERENTIAL (CHCC SATELLITE)
ALC: 46.5 10*3/uL — ABNORMAL HIGH (ref 0.9–3.3)
ANC (CHCC HP manual diff): 2.5 10*3/uL (ref 1.5–6.5)
LYMPH: 93 % — AB (ref 14–48)
MONO: 2 % (ref 0–13)
PLT EST ~~LOC~~: ADEQUATE
SEG: 5 % — AB (ref 40–75)

## 2016-03-05 LAB — LACTATE DEHYDROGENASE: LDH: 106 U/L — AB (ref 125–245)

## 2016-03-05 LAB — CHCC SATELLITE - SMEAR

## 2016-03-05 NOTE — Progress Notes (Signed)
Hematology and Oncology Follow Up Visit  TANISH PRIEN [id number] Nov 14, 1950 65 y.o. 03/05/2016   Principle Diagnosis:   CLL-stage A  Current Therapy:    Observation     Interim History:  Mr.  Bonebrake is back for followup. We saw him last year. As always, they've been out to or gone. They went out and August. He had a great time. In February of next year, they will be going to [place]. They will be out there skiing.  He has had quite a few tick bites. His last one was a few weeks ago. A couple times, he had cellulitis. He was given some antibiotics for this.  He's had no fever. He's had no rashes. He's had no swollen lymph nodes. He's had no change in bowel or bladder habits.  He is still exercising quite a bit.  Overall, his performance test is ECOG 0.    Medications:  Current Outpatient Prescriptions:  .  aspirin 81 MG EC tablet, Take 81 mg by mouth daily.  , Disp: , Rfl:  .  cholecalciferol (VITAMIN D) 1000 UNITS tablet, Take 1,000 Units by mouth daily. 2 tablets daily, Disp: , Rfl:  .  Coenzyme Q10 (CO Q 10 PO), Take by mouth every morning. , Disp: , Rfl:  .  esomeprazole (NEXIUM) 40 MG capsule, TAKE 1 CAPSULE BY MOUTH DAILY AS NEEDED., Disp: 90 capsule, Rfl: 1 .  GLUCOSAMINE HCL PO, Take by mouth every morning. , Disp: , Rfl:  .  Green Tea, Camillia sinensis, (GREEN TEA PO), Take 315 mg by mouth every morning. , Disp: , Rfl:  .  lactobacillus acidophilus (BACID) TABS tablet, Take 1 tablet by mouth daily. , Disp: , Rfl:  .  Multiple Vitamin (MULTI-VITAMIN PO), Take by mouth every morning. , Disp: , Rfl:  .  NIACIN CR PO, Take 500 mg by mouth daily. , Disp: , Rfl:  .  Omega-3 Fatty Acids (FISH OIL CONCENTRATE PO), Take by mouth every morning. , Disp: , Rfl:  .  psyllium (METAMUCIL) 58.6 % powder, Take 1 packet by mouth 2 (two) times daily. , Disp: , Rfl:  .  rosuvastatin (CRESTOR) 40 MG tablet, TAKE 1 TABLET BY MOUTH ONCE DAILY, Disp: 90 tablet, Rfl: 3 .  SEB-PREV WASH 10 %  LIQD, , Disp: , Rfl: 3 .  Zinc 30 MG TABS, Take by mouth every morning., Disp: , Rfl:   Allergies: No Known Allergies  Past Medical History, Surgical history, Social history, and Family History were reviewed and updated.  Review of Systems: As above  Physical Exam:  height is 5' 8.5" (1.74 m) and weight is 158 lb 12.8 oz (72 kg). His oral temperature is 98.1 F (36.7 C). His blood pressure is 156/100 (abnormal) and his pulse is 68. His respiration is 20.   Well-developed well-nourished gentleman. Head and neck exam shows no adenopathy in the neck. Thyroid is not palpable. No oral lesions are noted. Lungs are clear. Cardiac exam regular rate and rhythm with no murmurs rubs or bruits. Abdomen is soft. He has good bowel sounds. There is no fluid wave. There is no palpable liver or spleen tip. Axillary exam shows a less than 1 cm lymph node under the right axilla. Back exam no tenderness over the spine ribs or hips. Extremities shows no clubbing cyanosis or edema. He has good strength in his extremities. He has good range of motion of his joints. Skin exam no rashes ecchymosis or petechia. Neurological exam is nonfocal.  Lab Results  Component Value Date   WBC 50.0 (H) 03/05/2016   HGB 14.1 03/05/2016   HCT 42.7 03/05/2016   MCV 94 03/05/2016   PLT 241 03/05/2016     Chemistry      Component Value Date/Time   NA 136 10/15/2015 0909   K 4.7 10/15/2015 0909   CL 100 10/15/2015 0909   CO2 26 10/15/2015 0909   BUN 10 10/15/2015 0909   CREATININE 0.73 10/15/2015 0909      Component Value Date/Time   CALCIUM 9.5 10/15/2015 0909   ALKPHOS 58 10/15/2015 0909   AST 28 10/15/2015 0909   ALT 19 10/15/2015 0909   BILITOT 0.8 10/15/2015 0909         Impression and Plan: Mr. Faciane is a 65 year old gentleman. He has CLL. We have been following him now for about 8 years. His disease has been progressing very slowly. Currently, there no indication for any intervention right now.His white  cell count is stable. It really is not doorknob for the past 2 years.  We'll check a Lyme disease titer on him. I think the tick bites and the possible cellulitis could be from his low neutrophil count.  We will plan to get him back in one year. I think this is reasonable.  Again, I don't see any indication for therapy. I don't see any indication for doing a bone marrow biopsy or other study on him.   Volanda Napoleon, MD 12/13/20178:36 AM

## 2016-03-06 LAB — BETA 2 MICROGLOBULIN, SERUM: BETA 2: 1.2 mg/L (ref 0.6–2.4)

## 2016-03-06 LAB — B. BURGDORFI ANTIBODIES

## 2016-03-10 LAB — PROTEIN ELECTROPHORESIS, SERUM, WITH REFLEX
A/G RATIO SPE: 1.8 — AB (ref 0.7–1.7)
ALBUMIN: 4.3 g/dL (ref 2.9–4.4)
Alpha 1: 0.1 g/dL (ref 0.0–0.4)
Alpha 2: 0.6 g/dL (ref 0.4–1.0)
BETA: 1 g/dL (ref 0.7–1.3)
GAMMA GLOBULIN: 0.7 g/dL (ref 0.4–1.8)
GLOBULIN, TOTAL: 2.4 g/dL (ref 2.2–3.9)
TOTAL PROTEIN: 6.7 g/dL (ref 6.0–8.5)

## 2016-04-02 ENCOUNTER — Ambulatory Visit (INDEPENDENT_AMBULATORY_CARE_PROVIDER_SITE_OTHER): Payer: 59 | Admitting: Family Medicine

## 2016-04-02 ENCOUNTER — Encounter: Payer: Self-pay | Admitting: Family Medicine

## 2016-04-02 VITALS — BP 143/92 | HR 73 | Ht 68.5 in | Wt 158.2 lb

## 2016-04-02 DIAGNOSIS — R030 Elevated blood-pressure reading, without diagnosis of hypertension: Secondary | ICD-10-CM | POA: Diagnosis not present

## 2016-04-02 DIAGNOSIS — K219 Gastro-esophageal reflux disease without esophagitis: Secondary | ICD-10-CM | POA: Diagnosis not present

## 2016-04-02 DIAGNOSIS — E782 Mixed hyperlipidemia: Secondary | ICD-10-CM | POA: Diagnosis not present

## 2016-04-02 MED ORDER — ROSUVASTATIN CALCIUM 40 MG PO TABS: 40.0000 mg | ORAL_TABLET | Freq: Every day | ORAL | 3 refills | Status: DC

## 2016-04-02 MED ORDER — ESOMEPRAZOLE MAGNESIUM 40 MG PO CPDR: 40.0000 mg | DELAYED_RELEASE_CAPSULE | Freq: Every day | ORAL | 3 refills | Status: DC | PRN

## 2016-04-02 MED FILL — ESOMEPRAZOLE MAG DR 40 MG C: 40 | 90 days supply | Qty: 90 | Fill #0

## 2016-04-02 MED FILL — ROSUVASTATIN CALCIUM 40 MG: 40 | 90 days supply | Qty: 90 | Fill #0

## 2016-04-02 NOTE — Progress Notes (Signed)
Impression and Recommendations:    1. Hyperlipidemia, mixed   2. Elevated blood pressure reading   3. Gastroesophageal reflux disease, esophagitis presence not specified     Refill medications.  Diet and lifestyle modifications discussed in addition to meds.  Elevated blood pressure:  low salt diet.  Monitor blood pressure. If remains above goal of consistently less than 130/80, return to  clinic sooner than planned.  Instructions given in AVS.   New Prescriptions   No medications on file    Modified Medications   Modified Medication Previous Medication   ESOMEPRAZOLE (NEXIUM) 40 MG CAPSULE esomeprazole (NEXIUM) 40 MG capsule      Take 1 capsule (40 mg total) by mouth daily as needed.    TAKE 1 CAPSULE BY MOUTH DAILY AS NEEDED.   ROSUVASTATIN (CRESTOR) 40 MG TABLET rosuvastatin (CRESTOR) 40 MG tablet      Take 1 tablet (40 mg total) by mouth daily.    TAKE 1 TABLET BY MOUTH ONCE DAILY    Discontinued Medications   No medications on file    The patient was counseled, risk factors were discussed, anticipatory guidance given.  Gross side effects, risk and benefits, and alternatives of medications and treatment plan in general discussed with patient.  Patient is aware that all medications have potential side effects and we are unable to predict every side effect or drug-drug interaction that may occur.   Patient will call with any questions prior to using medication if they have concerns.  Expresses verbal understanding and consents to current therapy and treatment regimen.  No barriers to understanding were identified.  Red flag symptoms and signs discussed in detail.  Patient expressed understanding regarding what to do in case of emergency\urgent symptoms  Return in about 6 months (around 09/30/2016) for Follow-up of current medical issues.  Please see AVS handed out to patient at the end of our visit for further patient instructions/ counseling done pertaining to  today's office visit.    Note: This document was prepared using Dragon voice recognition software and may include unintentional dictation errors.   --------------------------------------------------------------------------------------------------------------------------------------------------------------------------------------------------------------------------------------------    Subjective:    CC:  Chief Complaint  Patient presents with  . Follow-up    HPI: George [name] is a 66 y.o. male who presents to [place] at [facility] today for issues as discussed below.   Not biking as much as usual, more salt than usual;    No symptoms reflux. Well controlled. Tolerating medicines. No side effects. Patient has no complaints today.  Bp at home- not checking yet but has monitor.    Wt Readings from Last 3 Encounters:  04/02/16 158 lb 3.2 oz (71.8 kg)  03/05/16 158 lb 12.8 oz (72 kg)  10/22/15 152 lb 6.4 oz (69.1 kg)   BP Readings from Last 3 Encounters:  04/02/16 (!) 143/92  03/05/16 (!) 156/100  10/22/15 128/76   Pulse Readings from Last 3 Encounters:  04/02/16 73  03/05/16 68  10/22/15 (!) 59   BMI Readings from Last 3 Encounters:  04/02/16 23.70 kg/m  03/05/16 23.79 kg/m  10/22/15 22.84 kg/m     Patient Care Team    Relationship Specialty Notifications Start End  Mellody Dance, DO PCP - General Family Medicine  08/21/15   Devra Dopp, MD Referring Physician Dermatology  04/02/16   Volanda Napoleon, MD Consulting Physician Oncology  04/02/16    Comment: CLL    Patient Active Problem List  Diagnosis Date Noted  . Hyperlipidemia, mixed 04/21/2013    Priority: High  . GERD (gastroesophageal reflux disease) 10/22/2010    Priority: High  . CLL (chronic lymphocytic leukemia) ([place]) 10/14/2011    Priority: Medium  . Acute bronchitis 05/23/2015  . Elevated blood pressure reading 04/23/2014  . Tremor of right hand 04/17/2014    . Encounter for preventive health examination 10/23/2013  . Muscle cramp 04/21/2013    Past Medical history, Surgical history, Family history, Social history, Allergies and Medications have been entered into the medical record, reviewed and changed as needed.   Allergies:  No Known Allergies  Review of Systems  Constitutional: Negative for chills and fever.  HENT: Negative for congestion and sinus pain.   Eyes: Negative for blurred vision and double vision.  Respiratory: Negative for shortness of breath and wheezing.   Cardiovascular: Negative for chest pain, palpitations and orthopnea.  Gastrointestinal: Negative for constipation, diarrhea, heartburn, nausea and vomiting.  Genitourinary: Negative for frequency and hematuria.  Musculoskeletal: Negative for falls and joint pain.       Chronic jt pains  Skin: Negative for rash.  Neurological: Negative for dizziness, sensory change and focal weakness.  Endo/Heme/Allergies: Negative for polydipsia.  Psychiatric/Behavioral: Negative for depression and suicidal ideas. The patient is not nervous/anxious and does not have insomnia.      Objective:   Blood pressure (!) 143/92, pulse 73, height 5' 8.5" (1.74 m), weight 158 lb 3.2 oz (71.8 kg). Body mass index is 23.7 kg/m. General: Well Developed, well nourished, appropriate for stated age.  Neuro: Alert and oriented x3, extra-ocular muscles intact, sensation grossly intact.  HEENT: Normocephalic, atraumatic, neck supple, no carotid bruits appreciated  Skin: no gross rash. Cardiac: RRR, S1 S2 Respiratory: ECTA B/L, Not using accessory muscles, speaking in full sentences-unlabored. Vascular:  Ext warm, dry, pink; cap RF less 2 sec. Psych: No HI/SI, judgement and insight good, Euthymic mood. Full Affect.

## 2016-04-02 NOTE — Patient Instructions (Addendum)
F/up sooner than planned if BP remains above goal of less than 130/80     Managing Your Hypertension Hypertension is commonly called high blood pressure. Blood pressure is a measurement of how strongly your blood is pressing against the walls of your arteries. Arteries are blood vessels that carry blood from your heart throughout your body. Blood pressure does not stay the same. It rises when you are active, excited, or nervous. It lowers when you are sleeping or relaxed. If the numbers that measure your blood pressure stay above normal most of the time, you are at risk for health problems. Hypertension is a long-term (chronic) condition in which blood pressure is elevated. This condition often has no signs or symptoms. The cause of the condition is usually not known. What are blood pressure readings? A blood pressure reading is recorded as two numbers, such as "120 over 80" (or 120/80). The first ("top") number is called the systolic pressure. It is a measure of the pressure in your arteries as the heart beats. The second ("bottom") number is called the diastolic pressure. It is a measure of the pressure in your arteries as the heart relaxes between beats. What does my blood pressure reading mean? Blood pressure is classified into four stages. Based on your blood pressure reading, your health care provider may use the following stages to determine what type of treatment, if any, is needed. Systolic pressure and diastolic pressure are measured in a unit called mm Hg. Normal  Systolic pressure: below 123456.  Diastolic pressure: below 80. Prehypertension  Systolic pressure: 123456.  Diastolic pressure: [id number]. Hypertension stage 1  Systolic pressure: [id number].  Diastolic pressure: A999333. Hypertension stage 2  Systolic pressure: 0000000 or above.  Diastolic pressure: 123XX123 or above. What health risks are associated with hypertension? Managing your hypertension is an important responsibility.  Uncontrolled hypertension can lead to:  A heart attack.  A stroke.  A weakened blood vessel (aneurysm).  Heart failure.  Kidney damage.  Eye damage.  Metabolic syndrome.  Memory and concentration problems. What changes can I make to manage my hypertension? Hypertension can be managed effectively by making lifestyle changes and possibly by taking medicines. Your health care provider will help you come up with a plan to bring your blood pressure within a normal range. Your plan should include the following: Monitoring  Monitor your blood pressure at home as told by your health care provider. Your personal target blood pressure may vary depending on your medical conditions, your age, and other factors.  Have your blood pressure rechecked as told by your health care provider. Lifestyle  Lose weight if necessary.  Get at least 30-45 minutes of aerobic exercise at least 4 times a week.  Do not use any products that contain nicotine or tobacco, such as cigarettes and e-cigarettes. If you need help quitting, ask your health care provider.  Learn ways to reduce stress.  Control any chronic conditions, such as high cholesterol or diabetes. Eating and drinking  Follow the DASH diet. This diet is high in fruits, vegetables, and whole grains. It is low in salt, red meat, and added sugars.  Keep your sodium intake below 2,300 mg per day.  Limit alcoholic beverages. Communication  Review all the medicines you take with your health care provider because there may be side effects or interactions.  Talk with your health care provider about your diet, exercise habits, and other lifestyle factors that may be contributing to hypertension.  See your health care provider  regularly. Your health care provider can help you create and adjust your plan for managing hypertension. Will I need medicine to control my blood pressure? Your health care provider may prescribe medicine if lifestyle  changes are not enough to get your blood pressure under control, and if one of the following is true:  You are 55-21 years of age, and your systolic blood pressure is 140 or higher.  You are 51 years of age or older, and your systolic blood pressure is 150 or higher.  Your diastolic blood pressure is 90 or higher.  You have diabetes, and your systolic blood pressure is over [id number] or your diastolic blood pressure is over 90.  You have kidney disease, and your blood pressure is above 140/90.  You have heart disease or a history of stroke, and your blood pressure is 140/90 or higher. Take medicines only as told by your health care provider. Follow the directions carefully. Blood pressure medicines must be taken as prescribed. The medicine does not work as well when you skip doses. Skipping doses also puts you at risk for problems. Contact a health care provider if:  You think you are having a reaction to medicines you have taken.  You have repeated (recurrent) headaches.  You feel dizzy.  You have swelling in your ankles.  You have trouble with your vision. Get help right away if:  You develop a severe headache or confusion.  You have unusual weakness or numbness, or you feel faint.  You have severe pain in your chest or abdomen.  You vomit repeatedly.  You have trouble breathing. This information is not intended to replace advice given to you by your health care provider. Make sure you discuss any questions you have with your health care provider. Document Released: 12/03/2011 Document Revised: 11/13/2015 Document Reviewed: 06/08/2015 Elsevier Interactive Patient Education  2017 [employer] for a Low Sodium Diet   Low Sodium Diet A main source of sodium is table salt. The average American eats five or more teaspoons of salt each day. This is about 20 times as much as the body needs. In fact, your body needs only 1/4 teaspoon of salt every day. Sodium is  found naturally in foods, but a lot of it is added during processing and preparation. Many foods that do not taste salty may still be high in sodium. Large amounts of sodium can be hidden in canned, processed and convenience foods. And sodium can be found in many foods that are served at [employer].  Sodium controls fluid balance in our bodies and maintains blood volume and blood pressure. Eating too much sodium may raise blood pressure and cause fluid retention, which could lead to swelling of the legs and feet or other health issues.  When limiting sodium in your diet, a common target is to eat less than 2,000 milligrams of sodium per day.   General Guidelines for Cutting Down on Salt Eliminate salty foods from your diet and reduce the amount of salt used in cooking. Sea salt is no better than regular salt.  Choose low sodium foods. Many salt-free or reduced salt products are available. When reading food labels, low sodium is defined as 140 mg of sodium per serving.  Salt substitutes are sometimes made from potassium, so read the label. If you are on a low potassium diet, then check with your doctor before using those salt substitutes.  Be creative and season your foods with spices, herbs,  lemon, garlic, ginger, vinegar and pepper. Remove the salt shaker from the table.  Read ingredient labels to identify foods high in sodium. Items with 400 mg or more of sodium are high in sodium. High sodium food additives include salt, brine, or other items that say sodium, such as monosodium glutamate.  Eat more home-cooked meals. Foods cooked from scratch are naturally lower in sodium than most instant and boxed mixes.  Don't use softened water for cooking and drinking since it contains added salt.  Avoid medications which contain sodium such as [employer].  For more information; food composition books are available which tell how much sodium is in food. Online sources  such as www.calorieking.com also list amounts.     Meats, Poultry, Fish, Legumes, Eggs and Nuts  High-Sodium Foods: Smoked, cured, salted or canned meat, fish or poultry including bacon, cold cuts, ham, frankfurters, sausage, sardines, caviar and anchovies Frozen breaded meats and dinners, such as burritos and pizza Canned entrees, such as ravioli, spam and chili Salted nuts Beans canned with salt added  Low-Sodium Alternatives: Any fresh or frozen beef, lamb, pork, poultry and fish Eggs and egg substitutes Low-sodium peanut butter Dry peas and beans (not canned) Low-sodium canned fish Drained, water or oil packed canned fish or poultry   Dairy Products  High-Sodium Foods: Buttermilk Regular and processed cheese, cheese spreads and sauces Cottage cheese  Low-Sodium Alternatives: Milk, yogurt, ice cream and ice milk Low-sodium cheeses, cream cheese, ricotta cheese and mozzarella   Breads, Grains and Cereals  High-Sodium Foods: Bread and rolls with salted tops Quick breads, self-rising flour, biscuit, pancake and waffle mixes Pizza, croutons and salted crackers Prepackaged, processed mixes for potatoes, rice, pasta and stuffing  Low-Sodium Alternatives: Breads, bagels and rolls without salted tops Muffins and most ready-to-eat cereals All rice and pasta, but do not to add salt when cooking Low-sodium corn and flour tortillas and noodles Low-sodium crackers and breadsticks Unsalted popcorn, chips and pretzels      Vegetables and Fruits  High-Sodium Foods: Regular canned vegetables and vegetable juices Olives, pickles, sauerkraut and other pickled vegetables Vegetables made with ham, bacon or salted pork Packaged mixes, such as scalloped or au gratin potatoes, frozen hash browns and Tater Tots Commercially prepared pasta and tomato sauces and salsa  Low-Sodium Alternatives: Fresh and frozen vegetables without sauces Low-sodium canned vegetables, sauces  and juices Fresh potatoes, frozen [place] fries and instant mashed potatoes Low-salt tomato or V-8 juice. Most fresh, frozen and canned fruit Dried fruits   Soups  High-Sodium Foods: Regular canned and dehydrated soup, broth and bouillon Cup of noodles and seasoned ramen mixes  Low-Sodium Alternatives: Low-sodium canned and dehydrated soups, broth and bouillon Homemade soups without added salt   Fats, Desserts and Sweets  High-Sodium Foods: Soy sauce, seasoning salt, other sauces and marinades Bottled salad dressings, regular salad dressing with bacon bits Salted butter or margarine Instant pudding and cake Large portions of ketchup, mustard  Low-Sodium Alternatives: Vinegar, unsalted butter or margarine Vegetable oils and low sodium sauces and salad dressings Mayonnaise All desserts made without salt   How to Take Your Blood Pressure HOW DO I GET A BLOOD PRESSURE MACHINE?  You can buy an electronic home blood pressure machine at your local pharmacy. Insurance will sometimes cover the cost if you have a prescription.  Ask your doctor what type of machine is best for you. There are different machines for your arm and your wrist.  If you decide to buy  a machine to check your blood pressure on your arm, first check the size of your arm so you can buy the right size cuff. To check the size of your arm:   Use a measuring tape that shows both inches and centimeters.   Wrap the measuring tape around the upper-middle part of your arm. You may need someone to help you measure.   Write down your arm measurement in both inches and centimeters.   To measure your blood pressure correctly, it is important to have the right size cuff.   If your arm is up to 13 inches (up to 34 centimeters), get an adult cuff size.  If your arm is 13 to 17 inches (35 to 44 centimeters), get a large adult cuff size.    If your arm is 17 to 20 inches (45 to 52 centimeters), get an adult  thigh cuff.  WHAT DO THE NUMBERS MEAN?   There are two numbers that make up your blood pressure. For example: 120/80.  The first number (120 in our example) is called the "systolic pressure." It is a measure of the pressure in your blood vessels when your heart is pumping blood.  The second number (80 in our example) is called the "diastolic pressure." It is a measure of the pressure in your blood vessels when your heart is resting between beats.  Your doctor will tell you what your blood pressure should be. WHAT SHOULD I DO BEFORE I CHECK MY BLOOD PRESSURE?   Try to rest or relax for at least 20 minutes before you check your blood pressure.  Do not smoke.  Do not have any drinks with caffeine, such as:  Soda.  Coffee.  Tea.  Check your blood pressure in a quiet room.  Sit down and stretch out your arm on a table. Keep your arm at about the level of your heart. Let your arm relax.  Make sure that your legs are not crossed. HOW DO I CHECK MY BLOOD PRESSURE?  Follow the directions that came with your machine.  Make sure you remove any tight-fitting clothing from your arm or wrist. Wrap the cuff around your upper arm or wrist. You should be able to fit a finger between the cuff and your arm. If you cannot fit a finger between the cuff and your arm, it is too tight and should be removed and rewrapped.  Some units require you to manually pump up the arm cuff.  Automatic units inflate the cuff when you press a button.  Cuff deflation is automatic in both models.  After the cuff is inflated, the unit measures your blood pressure and pulse. The readings are shown on a monitor. Hold still and breathe normally while the cuff is inflated.  Getting a reading takes less than a minute.  Some models store readings in a memory. Some provide a printout of readings. If your machine does not store your readings, keep a written record.  Take readings with you to your next visit with your  doctor. This information is not intended to replace advice given to you by your health care provider. Make sure you discuss any questions you have with your health care provider. Document Released: 02/21/2008 Document Revised: 03/31/2014 Document Reviewed: 05/05/2013 Elsevier Interactive Patient Education  2017 [employer].

## 2016-04-04 ENCOUNTER — Encounter: Payer: Self-pay | Admitting: Family Medicine

## 2016-04-09 MED FILL — SEB-PREV 10% WASH: 10 | 30 days supply | Qty: 340 | Fill #1

## 2016-04-28 ENCOUNTER — Encounter: Payer: Self-pay | Admitting: *Deleted

## 2016-07-21 MED FILL — ESOMEPRAZOLE MAG DR 40 MG C: 40 | 90 days supply | Qty: 90 | Fill #1

## 2016-07-21 MED FILL — ROSUVASTATIN CALCIUM 40 MG: 40 | 90 days supply | Qty: 90 | Fill #1

## 2016-07-24 ENCOUNTER — Encounter (HOSPITAL_COMMUNITY): Payer: Self-pay | Admitting: Emergency Medicine

## 2016-07-24 ENCOUNTER — Ambulatory Visit (HOSPITAL_COMMUNITY)
Admission: EM | Admit: 2016-07-24 | Discharge: 2016-07-24 | Disposition: A | Discharge status: Home / Self Care | Payer: 59 | Attending: Family Medicine | Admitting: Family Medicine

## 2016-07-24 DIAGNOSIS — W57XXXA Bitten or stung by nonvenomous insect and other nonvenomous arthropods, initial encounter: Secondary | ICD-10-CM | POA: Diagnosis not present

## 2016-07-24 DIAGNOSIS — S70262A Insect bite (nonvenomous), left hip, initial encounter: Secondary | ICD-10-CM

## 2016-07-24 DIAGNOSIS — R21 Rash and other nonspecific skin eruption: Secondary | ICD-10-CM

## 2016-07-24 MED ORDER — DOXYCYCLINE HYCLATE 100 MG PO CAPS: 100.0000 mg | ORAL_CAPSULE | Freq: Two times a day (BID) | ORAL | 0 refills | Status: DC

## 2016-07-24 MED ORDER — HYDROXYZINE HCL 25 MG PO TABS: 25.0000 mg | ORAL_TABLET | Freq: Four times a day (QID) | ORAL | 0 refills | Status: DC

## 2016-07-24 MED FILL — hydrOXYzine HCL 25 MG TABS: 25 | 3 days supply | Qty: 12 | Fill #0

## 2016-07-24 MED FILL — DOXYCYCLINE HYC 100 MG TAB: 100 | 10 days supply | Qty: 20 | Fill #0

## 2016-07-24 NOTE — ED Triage Notes (Signed)
Pt here for tick bite yest to left hip ... Reports he removed it yest am  Has noticed some redness around the area   Denies fevers, chills, BA  A&O x4... NAD

## 2016-08-08 MED FILL — SODIUM SULFACETAMIDE 10% WA: 10 | 30 days supply | Qty: 355 | Fill #2

## 2016-08-26 ENCOUNTER — Other Ambulatory Visit: Payer: Self-pay

## 2016-08-26 ENCOUNTER — Other Ambulatory Visit (INDEPENDENT_AMBULATORY_CARE_PROVIDER_SITE_OTHER): Payer: 59

## 2016-08-26 DIAGNOSIS — Z1159 Encounter for screening for other viral diseases: Secondary | ICD-10-CM | POA: Diagnosis not present

## 2016-08-26 DIAGNOSIS — C9110 Chronic lymphocytic leukemia of B-cell type not having achieved remission: Secondary | ICD-10-CM

## 2016-08-26 DIAGNOSIS — E782 Mixed hyperlipidemia: Secondary | ICD-10-CM

## 2016-08-26 DIAGNOSIS — Z833 Family history of diabetes mellitus: Secondary | ICD-10-CM

## 2016-08-26 DIAGNOSIS — R5383 Other fatigue: Secondary | ICD-10-CM | POA: Diagnosis not present

## 2016-08-26 DIAGNOSIS — Z114 Encounter for screening for human immunodeficiency virus [HIV]: Secondary | ICD-10-CM | POA: Diagnosis not present

## 2016-08-26 DIAGNOSIS — C9190 Lymphoid leukemia, unspecified not having achieved remission: Secondary | ICD-10-CM | POA: Diagnosis not present

## 2016-08-26 DIAGNOSIS — R030 Elevated blood-pressure reading, without diagnosis of hypertension: Secondary | ICD-10-CM

## 2016-08-26 DIAGNOSIS — Z1321 Encounter for screening for nutritional disorder: Secondary | ICD-10-CM

## 2016-08-27 LAB — CBC WITH DIFFERENTIAL/PLATELET
Basophils Absolute: 0.1 10*3/uL (ref 0.0–0.2)
Basos: 0 %
EOS (ABSOLUTE): 0.1 10*3/uL (ref 0.0–0.4)
Eos: 0 %
Hematocrit: 40.7 % (ref 37.5–51.0)
Hemoglobin: 13.5 g/dL (ref 13.0–17.7)
Immature Grans (Abs): 0 10*3/uL (ref 0.0–0.1)
Immature Granulocytes: 0 %
Lymphocytes Absolute: 45.6 10*3/uL — ABNORMAL HIGH (ref 0.7–3.1)
Lymphs: 92 %
MCH: 31.1 pg (ref 26.6–33.0)
MCHC: 33.2 g/dL (ref 31.5–35.7)
MCV: 94 fL (ref 79–97)
Monocytes Absolute: 1 10*3/uL — ABNORMAL HIGH (ref 0.1–0.9)
Monocytes: 2 %
Neutrophils Absolute: 3.2 10*3/uL (ref 1.4–7.0)
Neutrophils: 6 %
Platelets: 202 10*3/uL (ref 150–379)
RBC: 4.34 x10E6/uL (ref 4.14–5.80)
RDW: 13.6 % (ref 12.3–15.4)
WBC: 49.9 10*3/uL (ref 3.4–10.8)

## 2016-08-27 LAB — COMPREHENSIVE METABOLIC PANEL
ALK PHOS: 69 IU/L (ref 39–117)
ALT: 35 IU/L (ref 0–44)
AST: 51 IU/L — ABNORMAL HIGH (ref 0–40)
Albumin/Globulin Ratio: 2.2 (ref 1.2–2.2)
Albumin: 4.8 g/dL (ref 3.6–4.8)
BILIRUBIN TOTAL: 0.6 mg/dL (ref 0.0–1.2)
BUN/Creatinine Ratio: 12 (ref 10–24)
BUN: 10 mg/dL (ref 8–27)
CHLORIDE: 99 mmol/L (ref 96–106)
CO2: 22 mmol/L (ref 18–29)
CREATININE: 0.83 mg/dL (ref 0.76–1.27)
Calcium: 9.3 mg/dL (ref 8.6–10.2)
GFR calc Af Amer: 107 mL/min/{1.73_m2} (ref >59)
GFR calc non Af Amer: 92 mL/min/{1.73_m2} (ref >59)
GLOBULIN, TOTAL: 2.2 g/dL (ref 1.5–4.5)
Glucose: 64 mg/dL — ABNORMAL LOW (ref 65–99)
Potassium: 4.5 mmol/L (ref 3.5–5.2)
SODIUM: 140 mmol/L (ref 134–144)
Total Protein: 7 g/dL (ref 6.0–8.5)

## 2016-08-27 LAB — LIPID PANEL
Chol/HDL Ratio: 2.3 ratio (ref 0.0–5.0)
Cholesterol, Total: 206 mg/dL — ABNORMAL HIGH (ref 100–199)
HDL: 88 mg/dL (ref >39)
LDL Calculated: 108 mg/dL — ABNORMAL HIGH (ref 0–99)
Triglycerides: 51 mg/dL (ref 0–149)
VLDL Cholesterol Cal: 10 mg/dL (ref 5–40)

## 2016-08-27 LAB — TSH: TSH: 2.39 u[IU]/mL (ref 0.450–4.500)

## 2016-08-27 LAB — HEMOGLOBIN A1C
ESTIMATED AVERAGE GLUCOSE: 100 mg/dL
HEMOGLOBIN A1C: 5.1 % (ref 4.8–5.6)

## 2016-08-27 LAB — VITAMIN B12: Vitamin B-12: 347 pg/mL (ref 232–1245)

## 2016-08-27 LAB — VITAMIN D 25 HYDROXY (VIT D DEFICIENCY, FRACTURES): Vit D, 25-Hydroxy: 43.2 ng/mL (ref 30.0–100.0)

## 2016-09-02 ENCOUNTER — Encounter: Payer: Self-pay | Admitting: Family Medicine

## 2016-09-02 ENCOUNTER — Telehealth: Payer: Self-pay

## 2016-09-02 ENCOUNTER — Ambulatory Visit: Payer: 59 | Admitting: Family Medicine

## 2016-09-02 VITALS — BP 128/84 | HR 67 | Ht 68.5 in | Wt 151.0 lb

## 2016-09-02 DIAGNOSIS — Z1159 Encounter for screening for other viral diseases: Secondary | ICD-10-CM

## 2016-09-02 DIAGNOSIS — C9190 Lymphoid leukemia, unspecified not having achieved remission: Secondary | ICD-10-CM

## 2016-09-02 DIAGNOSIS — K219 Gastro-esophageal reflux disease without esophagitis: Secondary | ICD-10-CM

## 2016-09-02 DIAGNOSIS — Z114 Encounter for screening for human immunodeficiency virus [HIV]: Secondary | ICD-10-CM

## 2016-09-02 DIAGNOSIS — C9110 Chronic lymphocytic leukemia of B-cell type not having achieved remission: Secondary | ICD-10-CM

## 2016-09-02 DIAGNOSIS — L0390 Cellulitis, unspecified: Secondary | ICD-10-CM

## 2016-09-02 DIAGNOSIS — W57XXXS Bitten or stung by nonvenomous insect and other nonvenomous arthropods, sequela: Secondary | ICD-10-CM

## 2016-09-02 MED ORDER — MUPIROCIN 2 % EX OINT: TOPICAL_OINTMENT | CUTANEOUS | 3 refills | Status: DC

## 2016-09-02 MED ORDER — ESOMEPRAZOLE MAGNESIUM 20 MG PO CPDR: 20.0000 mg | DELAYED_RELEASE_CAPSULE | Freq: Every day | ORAL | 3 refills | Status: DC

## 2016-09-02 MED FILL — ESOMEPRAZOLE MAGNESIUM 20 M: 20 | 90 days supply | Qty: 90 | Fill #0

## 2016-09-02 MED FILL — MUPIROCIN 2% OINTMENT: 2 | 7 days supply | Qty: 22 | Fill #0

## 2016-09-02 NOTE — Patient Instructions (Signed)
We will see you in 6 months for yearly physical.  Or sooner if anything comes up or you have any concerns or questions.    Food Choices for Gastroesophageal Reflux Disease, Adult When you have gastroesophageal reflux disease (GERD), the foods you eat and your eating habits are very important. Choosing the right foods can help ease your discomfort. What guidelines do I need to follow?  Choose fruits, vegetables, whole grains, and low-fat dairy products.  Choose low-fat meat, fish, and poultry.  Limit fats such as oils, salad dressings, butter, nuts, and avocado.  Keep a food diary. This helps you identify foods that cause symptoms.  Avoid foods that cause symptoms. These may be different for everyone.  Eat small meals often instead of 3 large meals a day.  Eat your meals slowly, in a place where you are relaxed.  Limit fried foods.  Cook foods using methods other than frying.  Avoid drinking alcohol.  Avoid drinking large amounts of liquids with your meals.  Avoid bending over or lying down until 2-3 hours after eating. What foods are not recommended? These are some foods and drinks that may make your symptoms worse: Vegetables Tomatoes. Tomato juice. Tomato and spaghetti sauce. Chili peppers. Onion and garlic. Horseradish. Fruits Oranges, grapefruit, and lemon (fruit and juice). Meats High-fat meats, fish, and poultry. This includes hot dogs, ribs, ham, sausage, salami, and bacon. Dairy Whole milk and chocolate milk. Sour cream. Cream. Butter. Ice cream. Cream cheese. Drinks Coffee and tea. Bubbly (carbonated) drinks or energy drinks. Condiments Hot sauce. Barbecue sauce. Sweets/Desserts Chocolate and cocoa. Donuts. Peppermint and spearmint. Fats and Oils High-fat foods. This includes [place] fries and potato chips. Other Vinegar. Strong spices. This includes black pepper, white pepper, red pepper, cayenne, curry powder, cloves, ginger, and chili powder. The items  listed above may not be a complete list of foods and drinks to avoid. Contact your dietitian for more information. This information is not intended to replace advice given to you by your health care provider. Make sure you discuss any questions you have with your health care provider. Document Released: 09/09/2011 Document Revised: 08/16/2015 Document Reviewed: 01/12/2013 Elsevier Interactive Patient Education  2017 [employer].

## 2016-09-02 NOTE — Telephone Encounter (Signed)
Called [facility] and added HIV/Hep C screen to bloodwork to satisfy health maintenance.  We will get a fax to determine if lab can be added.

## 2016-09-02 NOTE — Telephone Encounter (Signed)
Left message advising patient HIV and Hep C blood work could not be added to recent labs.  Orders placed for future.

## 2016-09-02 NOTE — Progress Notes (Signed)
Impression and Recommendations:    1. Encounter for screening for HIV   2. Encounter for hepatitis C screening test for low risk patient   3. Gastroesophageal reflux disease, esophagitis presence not specified   4. Cellulitis, unspecified cellulitis site   5. Tick bite, sequela   6. CLL (chronic lymphocytic leukemia) (HCC) Chronic     No problem-specific Assessment & Plan notes found for this encounter.   The patient was counseled, risk factors were discussed, anticipatory guidance given.   New Prescriptions   MUPIROCIN OINTMENT (BACTROBAN) 2 %    Apply to affected area TID for 7 days.     Discontinued Medications   DOXYCYCLINE (VIBRAMYCIN) 100 MG CAPSULE    Take 1 capsule (100 mg total) by mouth 2 (two) times daily.      No orders of the defined types were placed in this encounter.    Gross side effects, risk and benefits, and alternatives of medications and treatment plan in general discussed with patient.  Patient is aware that all medications have potential side effects and we are unable to predict every side effect or drug-drug interaction that may occur.   Patient will call with any questions prior to using medication if they have concerns.  Expresses verbal understanding and consents to current therapy and treatment regimen.  No barriers to understanding were identified.  Red flag symptoms and signs discussed in detail.  Patient expressed understanding regarding what to do in case of emergency\urgent symptoms  Please see AVS handed out to patient at the end of our visit for further patient instructions/ counseling done pertaining to today's office visit.   Return in about 6 months (around 03/04/2017).     Note: This document was prepared using Dragon voice recognition software and may include unintentional dictation errors.  Nakia Remmers 1:22  PM --------------------------------------------------------------------------------------------------------------------------------------------------------------------------------------------------------------------------------------------    Subjective:    CC:  Chief Complaint  Patient presents with  . Hyperlipidemia  . Hypertension    HPI:CC:  George [name] is a 66 y.o. male who presents to [place] at [facility] today for issues as - review and discussion of recent bloodwork that was done.  1. All recent blood work that we ordered was reviewed with patient today.  Patient was counseled on all abnormalities and we discussed dietary and lifestyle changes that could help those values (also medications when appropriate).  Extensive health counseling performed and all patient's concerns/ questions were addressed.  2. GERD-patient symptoms are well controlled on current regimen of esomeprazole 40 mg.   He does not have any symptoms.   3. Riding his bike daily at least 20 miles and eating much healthier.  Has really cut out sugars from his diet. 4. LDL decreased, triglycerides significantly decreased and HDL increased.  Discussed these clinical indicators with patient. 66. Was recently seen in the urgent care about a month or so ago for tick bite.  He did take doxycycline for 4 days to 5 days and had burning sensation in his mouth with side effect.    Problem  Tick Bite  Cellulitis     Wt Readings from Last 3 Encounters:  09/02/16 151 lb (68.5 kg)  04/02/16 158 lb 3.2 oz (71.8 kg)  03/05/16 158 lb 12.8 oz (72 kg)   BP Readings from Last 3 Encounters:  09/02/16 128/84  07/24/16 (!) 156/96  04/02/16 (!) 143/92   Pulse Readings from Last 3 Encounters:  09/02/16 67  07/24/16 93  04/02/16 73   BMI Readings from Last 3 Encounters:  09/02/16 22.63 kg/m  04/02/16 23.70 kg/m  03/05/16 23.79 kg/m     Patient Care Team    Relationship Specialty Notifications  Start End  Mellody Dance, DO PCP - General Family Medicine  08/21/15   Devra Dopp, MD Referring Physician Dermatology  04/02/16   Volanda Napoleon, MD Consulting Physician Oncology  04/02/16    Comment: CLL     Patient Active Problem List   Diagnosis Date Noted  . Hyperlipidemia, mixed 04/21/2013    Priority: High  . GERD (gastroesophageal reflux disease) 10/22/2010    Priority: High  . Elevated blood pressure reading 04/23/2014    Priority: Medium  . CLL (chronic lymphocytic leukemia) ([place]) 10/14/2011    Priority: Medium  . Muscle cramp 04/21/2013    Priority: Low  . Acute bronchitis 05/23/2015  . Tremor of right hand 04/17/2014  . Encounter for preventive health examination 10/23/2013  . Tick bite 08/04/2013  . Cellulitis 08/04/2013    Past Medical history, Surgical history, Family history, Social history, Allergies and Medications have been entered into the medical record, reviewed and changed as needed.    Current Meds  Medication Sig  . aspirin 81 MG EC tablet Take 81 mg by mouth daily.    . cholecalciferol (VITAMIN D) 1000 UNITS tablet Take 1,000 Units by mouth daily. 2 tablets daily  . Coenzyme Q10 (CO Q 10 PO) Take by mouth every morning.   Marland Kitchen esomeprazole (NEXIUM) 20 MG capsule Take 1 capsule (20 mg total) by mouth daily.  Marland Kitchen GLUCOSAMINE HCL PO Take by mouth every morning.   Nyoka Cowden Tea, Camillia sinensis, (GREEN TEA PO) Take 315 mg by mouth every morning.   . lactobacillus acidophilus (BACID) TABS tablet Take 1 tablet by mouth daily.   . Multiple Vitamin (MULTI-VITAMIN PO) Take by mouth every morning.   Marland Kitchen NIACIN CR PO Take 500 mg by mouth daily.   . Omega-3 Fatty Acids (FISH OIL CONCENTRATE PO) Take by mouth every morning.   . psyllium (METAMUCIL) 58.6 % powder Take 1 packet by mouth 2 (two) times daily.   . rosuvastatin (CRESTOR) 40 MG tablet Take 1 tablet (40 mg total) by mouth daily.  . SEB-PREV WASH 10 % LIQD   . Zinc 30 MG TABS Take by mouth  every morning.  . [DISCONTINUED] doxycycline (VIBRAMYCIN) 100 MG capsule Take 1 capsule (100 mg total) by mouth 2 (two) times daily.  . [DISCONTINUED] esomeprazole (NEXIUM) 40 MG capsule Take 1 capsule (40 mg total) by mouth daily as needed.    Allergies:  Allergies  Allergen Reactions  . Doxycycline     Sore throat, red tongue, red skin     Review of Systems: General:   Denies fever, chills, unexplained weight loss.  Optho/Auditory:   Denies visual changes, blurred vision/LOV Respiratory:   Denies wheeze, DOE more than baseline levels.  Cardiovascular:   Denies chest pain, palpitations, new onset peripheral edema  Gastrointestinal:   Denies nausea, vomiting, diarrhea, abd pain.  Genitourinary: Denies dysuria, freq/ urgency, flank pain or discharge from genitals.  Endocrine:     Denies hot or cold intolerance, polyuria, polydipsia. Musculoskeletal:   Denies unexplained myalgias, joint swelling, unexplained arthralgias, gait problems.  Skin:  Denies new onset rash, suspicious lesions Neurological:     Denies dizziness, unexplained weakness, numbness  Psychiatric/Behavioral:   Denies mood changes, suicidal or homicidal ideations, hallucinations    Objective:   Blood pressure  128/84, pulse 67, height 5' 8.5" (1.74 m), weight 151 lb (68.5 kg). Body mass index is 22.63 kg/m. General:  Well Developed, well nourished, appropriate for stated age.  Neuro:  Alert and oriented,  extra-ocular muscles intact  HEENT:  Normocephalic, atraumatic, neck supple, no carotid bruits appreciated  Skin:  no gross rash, warm, pink. Cardiac:  RRR, S1 S2 Respiratory:  ECTA B/L and A/P, Not using accessory muscles, speaking in full sentences- unlabored. Vascular:  Ext warm, no cyanosis apprec.; cap RF less 2 sec. Psych:  No HI/SI, judgement and insight good, Euthymic mood. Full Affect.

## 2016-09-19 LAB — SPECIMEN STATUS REPORT

## 2016-09-19 LAB — HEPATITIS C ANTIBODY

## 2016-11-17 MED FILL — SODIUM SULFACETAMIDE 10% WA: 10 | 30 days supply | Qty: 355 | Fill #0

## 2016-11-25 MED FILL — ROSUVASTATIN CALCIUM 40 MG: 40 | 90 days supply | Qty: 90 | Fill #2

## 2016-12-23 DIAGNOSIS — H179 Unspecified corneal scar and opacity: Secondary | ICD-10-CM | POA: Diagnosis not present

## 2016-12-23 DIAGNOSIS — H1849 Other corneal degeneration: Secondary | ICD-10-CM | POA: Diagnosis not present

## 2016-12-23 DIAGNOSIS — H35033 Hypertensive retinopathy, bilateral: Secondary | ICD-10-CM | POA: Diagnosis not present

## 2016-12-23 DIAGNOSIS — H35361 Drusen (degenerative) of macula, right eye: Secondary | ICD-10-CM | POA: Diagnosis not present

## 2016-12-30 ENCOUNTER — Telehealth: Payer: Self-pay | Admitting: Family Medicine

## 2016-12-30 NOTE — Telephone Encounter (Signed)
Patient is coming in on Friday for his flu shot, he wants to know should he get a pneumonia shot too.

## 2016-12-30 NOTE — Telephone Encounter (Signed)
Reviewed patient's chart he is due for pneumonia 23.  Called patient and notified that he could get both on done on Friday.  MPulliam, CMA/RT(R)

## 2017-01-02 ENCOUNTER — Ambulatory Visit: Payer: 59

## 2017-01-08 ENCOUNTER — Other Ambulatory Visit: Payer: Self-pay

## 2017-01-08 DIAGNOSIS — Z23 Encounter for immunization: Secondary | ICD-10-CM

## 2017-01-09 ENCOUNTER — Ambulatory Visit (INDEPENDENT_AMBULATORY_CARE_PROVIDER_SITE_OTHER): Payer: 59

## 2017-01-09 DIAGNOSIS — Z23 Encounter for immunization: Secondary | ICD-10-CM | POA: Diagnosis not present

## 2017-02-10 MED FILL — SODIUM SULFACETAMIDE 10% WA: 10 | 30 days supply | Qty: 355 | Fill #1

## 2017-03-04 ENCOUNTER — Telehealth: Payer: Self-pay | Admitting: *Deleted

## 2017-03-04 ENCOUNTER — Ambulatory Visit (HOSPITAL_BASED_OUTPATIENT_CLINIC_OR_DEPARTMENT_OTHER): Payer: 59 | Admitting: Hematology & Oncology

## 2017-03-04 ENCOUNTER — Other Ambulatory Visit: Payer: 59

## 2017-03-04 ENCOUNTER — Other Ambulatory Visit: Payer: Self-pay

## 2017-03-04 VITALS — BP 167/90 | HR 64 | Temp 97.9°F | Resp 20 | Wt 162.5 lb

## 2017-03-04 DIAGNOSIS — Z114 Encounter for screening for human immunodeficiency virus [HIV]: Secondary | ICD-10-CM

## 2017-03-04 DIAGNOSIS — W57XXXA Bitten or stung by nonvenomous insect and other nonvenomous arthropods, initial encounter: Secondary | ICD-10-CM

## 2017-03-04 DIAGNOSIS — C9110 Chronic lymphocytic leukemia of B-cell type not having achieved remission: Secondary | ICD-10-CM

## 2017-03-04 DIAGNOSIS — Z1159 Encounter for screening for other viral diseases: Secondary | ICD-10-CM

## 2017-03-04 DIAGNOSIS — S30861A Insect bite (nonvenomous) of abdominal wall, initial encounter: Secondary | ICD-10-CM

## 2017-03-04 LAB — MANUAL DIFFERENTIAL (CHCC SATELLITE)
ALC: 46.6 10*3/uL — ABNORMAL HIGH (ref 0.9–3.3)
ANC (CHCC MAN DIFF): 6.4 10*3/uL (ref 1.5–6.5)
LYMPH: 87 % — ABNORMAL HIGH (ref 14–48)
MONO: 1 % (ref 0–13)
PLT EST ~~LOC~~: ADEQUATE
RBC Comments: NORMAL
SEG: 12 % — ABNORMAL LOW (ref 40–75)

## 2017-03-04 LAB — CHCC SATELLITE - SMEAR

## 2017-03-04 LAB — CBC WITH DIFFERENTIAL (CANCER CENTER ONLY)
HCT: 40.3 % (ref 38.7–49.9)
HGB: 13.4 g/dL (ref 13.0–17.1)
MCH: 31.2 pg (ref 28.0–33.4)
MCHC: 33.3 g/dL (ref 32.0–35.9)
MCV: 94 fL (ref 82–98)
PLATELETS: 193 10*3/uL (ref 145–400)
RBC: 4.3 10*6/uL (ref 4.20–5.70)
RDW: 12.9 % (ref 11.1–15.7)
WBC: 53.6 10*3/uL (ref 4.0–10.0)

## 2017-03-04 LAB — LACTATE DEHYDROGENASE: LDH: 115 U/L — ABNORMAL LOW (ref 125–245)

## 2017-03-04 NOTE — Progress Notes (Signed)
Hematology and Oncology Follow Up Visit  George [name] [id number] Jul 23, 1950 66 y.o. 03/04/2017 03/04/2017   Principle Diagnosis:   CLL-stage A  Current Therapy:    Observation     Interim History:  [name] is back for followup.  We see him yearly.  So far, everything is doing well.  He had a wonderful year.  As always, he and his wife go out [place].  They are out in [place].  They had a great time out in [place].  They are out there in September.  The also went out to [place] in February to go skiing.  He has been staying very active.  He likes to bike ride.  Unfortunately, with the snow, he is not be able to do as much bike riding.  He has had no fever.  He has had no lymph nodes.  He has had no nausea or vomiting.  He had no rashes.  Is had no change in bowel or bladder habits.  He has had no nausea or vomiting.  There is been no cough or shortness of breath.  Overall, his performance status is ECOG 0.  Medications:  Current Outpatient Medications:  .  aspirin 81 MG EC tablet, Take 81 mg by mouth daily.  , Disp: , Rfl:  .  cholecalciferol (VITAMIN D) 1000 UNITS tablet, Take 1,000 Units by mouth daily. 2 tablets daily, Disp: , Rfl:  .  Coenzyme Q10 (CO Q 10 PO), Take by mouth every morning. , Disp: , Rfl:  .  esomeprazole (NEXIUM) 20 MG capsule, Take 1 capsule (20 mg total) by mouth daily., Disp: 90 capsule, Rfl: 3 .  GLUCOSAMINE HCL PO, Take by mouth every morning. , Disp: , Rfl:  .  Green Tea, Camillia sinensis, (GREEN TEA PO), Take 315 mg by mouth every morning. , Disp: , Rfl:  .  hydrOXYzine (ATARAX/VISTARIL) 25 MG tablet, Take 1 tablet (25 mg total) by mouth every 6 (six) hours., Disp: 12 tablet, Rfl: 0 .  lactobacillus acidophilus (BACID) TABS tablet, Take 1 tablet by mouth daily. , Disp: , Rfl:  .  Multiple Vitamin (MULTI-VITAMIN PO), Take by mouth every morning. , Disp: , Rfl:  .  NIACIN CR PO, Take 500 mg by mouth daily. , Disp: , Rfl:  .  Omega-3 Fatty Acids (FISH OIL CONCENTRATE  PO), Take by mouth every morning. , Disp: , Rfl:  .  psyllium (METAMUCIL) 58.6 % powder, Take 1 packet by mouth 2 (two) times daily. , Disp: , Rfl:  .  rosuvastatin (CRESTOR) 40 MG tablet, Take 1 tablet (40 mg total) by mouth daily., Disp: 90 tablet, Rfl: 3 .  SEB-PREV WASH 10 % LIQD, , Disp: , Rfl: 3 .  Zinc 30 MG TABS, Take by mouth every morning., Disp: , Rfl:  .  mupirocin ointment (BACTROBAN) 2 %, Apply to affected area TID for 7 days. (Patient not taking: Reported on 03/04/2017), Disp: 30 g, Rfl: 3  Allergies:  Allergies  Allergen Reactions  . Doxycycline     Sore throat, red tongue, red skin    Past Medical History, Surgical history, Social history, and Family History were reviewed and updated.  Review of Systems: As stated in the interim history  Physical Exam:  weight is 162 lb 8 oz (73.7 kg). His oral temperature is 97.9 F (36.6 C). His blood pressure is 167/90 (abnormal) and his pulse is 64. His respiration is 20 and oxygen saturation is 99%.   Physical Exam  Constitutional: He is oriented to person, place, and time.  HENT:  Head: Normocephalic and atraumatic.  Mouth/Throat: Oropharynx is clear and moist.  Eyes: EOM are normal. Pupils are equal, round, and reactive to light.  Neck: Normal range of motion.  Cardiovascular: Normal rate, regular rhythm and normal heart sounds.  Pulmonary/Chest: Effort normal and breath sounds normal.  Abdominal: Soft. Bowel sounds are normal.  Musculoskeletal: Normal range of motion. He exhibits no edema, tenderness or deformity.  Lymphadenopathy:    He has no cervical adenopathy.  Neurological: He is alert and oriented to person, place, and time.  Skin: Skin is warm and dry. No rash noted. No erythema.  Psychiatric: He has a normal mood and affect. His behavior is normal. Judgment and thought content normal.  Vitals reviewed.    Lab Results  Component Value Date   WBC 53.6 (HH) 03/04/2017   HGB 13.4 03/04/2017   HCT 40.3  03/04/2017   MCV 94 03/04/2017   PLT 193 03/04/2017     Chemistry      Component Value Date/Time   NA 140 08/26/2016 0825   K 4.5 08/26/2016 0825   CL 99 08/26/2016 0825   CO2 22 08/26/2016 0825   BUN 10 08/26/2016 0825   CREATININE 0.83 08/26/2016 0825   CREATININE 0.73 10/15/2015 0909      Component Value Date/Time   CALCIUM 9.3 08/26/2016 0825   ALKPHOS 69 08/26/2016 0825   AST 51 (H) 08/26/2016 0825   ALT 35 08/26/2016 0825   BILITOT 0.6 08/26/2016 0825         Impression and Plan: Mr. Losito is a 66 year old gentleman. He has CLL. We have been following him now for about 9 years. His disease has been progressing very slowly. Currently, there no indication for any intervention right now.His white cell count is stable.  Everything really looks good.  I looked at his blood under the microscope.  All looked fine.  We will go ahead and plan to get him back in another year.  He knows that he can come back sooner if he has any problems.  Volanda Napoleon, MD 12/12/20188:51 AM

## 2017-03-04 NOTE — Telephone Encounter (Signed)
Critical Value WBC 53.6 Dr Marin Olp notified. No orders at this time

## 2017-03-05 ENCOUNTER — Ambulatory Visit: Payer: 59 | Admitting: Hematology & Oncology

## 2017-03-05 ENCOUNTER — Other Ambulatory Visit: Payer: 59

## 2017-03-05 ENCOUNTER — Other Ambulatory Visit (INDEPENDENT_AMBULATORY_CARE_PROVIDER_SITE_OTHER): Payer: 59

## 2017-03-05 DIAGNOSIS — E782 Mixed hyperlipidemia: Secondary | ICD-10-CM

## 2017-03-05 DIAGNOSIS — Z114 Encounter for screening for human immunodeficiency virus [HIV]: Secondary | ICD-10-CM | POA: Diagnosis not present

## 2017-03-05 DIAGNOSIS — R030 Elevated blood-pressure reading, without diagnosis of hypertension: Secondary | ICD-10-CM

## 2017-03-05 DIAGNOSIS — C9110 Chronic lymphocytic leukemia of B-cell type not having achieved remission: Secondary | ICD-10-CM

## 2017-03-05 DIAGNOSIS — C9190 Lymphoid leukemia, unspecified not having achieved remission: Secondary | ICD-10-CM | POA: Diagnosis not present

## 2017-03-06 LAB — CBC WITH DIFFERENTIAL/PLATELET
BASOS: 0 %
Basophils Absolute: 0.1 10*3/uL (ref 0.0–0.2)
EOS (ABSOLUTE): 0.1 10*3/uL (ref 0.0–0.4)
EOS: 0 %
HEMATOCRIT: 42 % (ref 37.5–51.0)
HEMOGLOBIN: 13.8 g/dL (ref 13.0–17.7)
Immature Grans (Abs): 0 10*3/uL (ref 0.0–0.1)
Immature Granulocytes: 0 %
Lymphocytes Absolute: 42.8 10*3/uL — ABNORMAL HIGH (ref 0.7–3.1)
Lymphs: 91 %
MCH: 31.3 pg (ref 26.6–33.0)
MCHC: 32.9 g/dL (ref 31.5–35.7)
MCV: 95 fL (ref 79–97)
MONOS ABS: 1.1 10*3/uL — AB (ref 0.1–0.9)
Monocytes: 2 %
NEUTROS ABS: 3.4 10*3/uL (ref 1.4–7.0)
NEUTROS PCT: 7 %
Platelets: 208 10*3/uL (ref 150–379)
RBC: 4.41 x10E6/uL (ref 4.14–5.80)
RDW: 13 % (ref 12.3–15.4)
WBC: 47.6 10*3/uL (ref 3.4–10.8)

## 2017-03-06 LAB — COMPREHENSIVE METABOLIC PANEL
A/G RATIO: 1.9 (ref 1.2–2.2)
ALBUMIN: 4.4 g/dL (ref 3.6–4.8)
ALK PHOS: 78 IU/L (ref 39–117)
ALT: 29 IU/L (ref 0–44)
AST: 55 IU/L — AB (ref 0–40)
BILIRUBIN TOTAL: 0.4 mg/dL (ref 0.0–1.2)
BUN / CREAT RATIO: 11 (ref 10–24)
BUN: 7 mg/dL — ABNORMAL LOW (ref 8–27)
CO2: 25 mmol/L (ref 20–29)
CREATININE: 0.65 mg/dL — AB (ref 0.76–1.27)
Calcium: 9 mg/dL (ref 8.6–10.2)
Chloride: 103 mmol/L (ref 96–106)
GFR calc Af Amer: 118 mL/min/{1.73_m2} (ref >59)
GFR calc non Af Amer: 102 mL/min/{1.73_m2} (ref >59)
GLOBULIN, TOTAL: 2.3 g/dL (ref 1.5–4.5)
Glucose: 90 mg/dL (ref 65–99)
POTASSIUM: 4.1 mmol/L (ref 3.5–5.2)
Sodium: 143 mmol/L (ref 134–144)
Total Protein: 6.7 g/dL (ref 6.0–8.5)

## 2017-03-06 LAB — LIPID PANEL
Chol/HDL Ratio: 2.4 ratio (ref 0.0–5.0)
Cholesterol, Total: 183 mg/dL (ref 100–199)
HDL: 77 mg/dL (ref >39)
LDL CALC: 97 mg/dL (ref 0–99)
Triglycerides: 45 mg/dL (ref 0–149)
VLDL CHOLESTEROL CAL: 9 mg/dL (ref 5–40)

## 2017-03-06 LAB — HIV ANTIBODY (ROUTINE TESTING W REFLEX): HIV SCREEN 4TH GENERATION: NONREACTIVE

## 2017-03-11 ENCOUNTER — Ambulatory Visit (INDEPENDENT_AMBULATORY_CARE_PROVIDER_SITE_OTHER): Payer: 59 | Admitting: Family Medicine

## 2017-03-11 ENCOUNTER — Encounter: Payer: Self-pay | Admitting: Family Medicine

## 2017-03-11 VITALS — BP 143/98 | HR 65 | Ht 68.5 in | Wt 159.0 lb

## 2017-03-11 DIAGNOSIS — Z87891 Personal history of nicotine dependence: Secondary | ICD-10-CM | POA: Diagnosis not present

## 2017-03-11 DIAGNOSIS — Z1211 Encounter for screening for malignant neoplasm of colon: Secondary | ICD-10-CM | POA: Diagnosis not present

## 2017-03-11 DIAGNOSIS — Z0000 Encounter for general adult medical examination without abnormal findings: Secondary | ICD-10-CM

## 2017-03-11 LAB — POC HEMOCCULT BLD/STL (OFFICE/1-CARD/DIAGNOSTIC): Fecal Occult Blood, POC: NEGATIVE

## 2017-03-11 NOTE — Patient Instructions (Signed)
We put an order for your AAA screen.  This is at [place] and you should be getting a call from them to set that up.  This is a one-time deal.  If you want to talk to your insurance about it prior please feel free to do so.  Also Melissa will give you a vaccine information sheet on the shin varix vaccine if you have questions or concerns after you talk to your insurance please let us know.  In the future you can call and just make a appointment to get that vaccine solely.   Preventive Care for Adults, Male A healthy lifestyle and preventive care can promote health and wellness. Preventive health guidelines for men include the following key practices:  A routine yearly physical is a good way to check with your health care provider about your health and preventative screening. It is a chance to share any concerns and updates on your health and to receive a thorough exam.  Visit your dentist for a routine exam and preventative care every 6 months. Brush your teeth twice a day and floss once a day. Good oral hygiene prevents tooth decay and gum disease.  The frequency of eye exams is based on your age, health, family medical history, use of contact lenses, and other factors. Follow your health care provider's recommendations for frequency of eye exams.  Eat a healthy diet. Foods such as vegetables, fruits, whole grains, low-fat dairy products, and lean protein foods contain the nutrients you need without too many calories. Decrease your intake of foods high in solid fats, added sugars, and salt. Eat the right amount of calories for you.Get information about a proper diet from your health care provider, if necessary.  Regular physical exercise is one of the most important things you can do for your health. Most adults should get at least 150 minutes of moderate-intensity exercise (any activity that increases your heart rate and causes you to sweat) each week. In addition, most adults need  muscle-strengthening exercises on 2 or more days a week.  Maintain a healthy weight. The body mass index (BMI) is a screening tool to identify possible weight problems. It provides an estimate of body fat based on height and weight. Your health care provider can find your BMI and can help you achieve or maintain a healthy weight.For adults 20 years and older:  A BMI below 18.5 is considered underweight.  A BMI of 18.5 to 24.9 is normal.  A BMI of 25 to 29.9 is considered overweight.  A BMI of 30 and above is considered obese.  Maintain normal blood lipids and cholesterol levels by exercising and minimizing your intake of saturated fat. Eat a balanced diet with plenty of fruit and vegetables. Blood tests for lipids and cholesterol should begin at age 57 and be repeated every 5 years. If your lipid or cholesterol levels are high, you are over 50, or you are at high risk for heart disease, you may need your cholesterol levels checked more frequently.Ongoing high lipid and cholesterol levels should be treated with medicines if diet and exercise are not working.  If you smoke, find out from your health care provider how to quit. If you do not use tobacco, do not start.  Lung cancer screening is recommended for adults aged 67-80 years who are at high risk for developing lung cancer because of a history of smoking. A yearly low-dose CT scan of the lungs is recommended for people who have at least  a 30-pack-year history of smoking and are a current smoker or have quit within the past 15 years. A pack year of smoking is smoking an average of 1 pack of cigarettes a day for 1 year (for example: 1 pack a day for 30 years or 2 packs a day for 15 years). Yearly screening should continue until the smoker has stopped smoking for at least 15 years. Yearly screening should be stopped for people who develop a health problem that would prevent them from having lung cancer treatment.  If you choose to drink alcohol,  do not have more than 2 drinks per day. One drink is considered to be 12 ounces (355 mL) of beer, 5 ounces (148 mL) of wine, or 1.5 ounces (44 mL) of liquor.  Avoid use of street drugs. Do not share needles with anyone. Ask for help if you need support or instructions about stopping the use of drugs.  High blood pressure causes heart disease and increases the risk of stroke. Your blood pressure should be checked at least every 1-2 years. Ongoing high blood pressure should be treated with medicines, if weight loss and exercise are not effective.  If you are 32-29 years old, ask your health care provider if you should take aspirin to prevent heart disease.  Diabetes screening is done by taking a blood sample to check your blood glucose level after you have not eaten for a certain period of time (fasting). If you are not overweight and you do not have risk factors for diabetes, you should be screened once every 3 years starting at age 25. If you are overweight or obese and you are 62-63 years of age, you should be screened for diabetes every year as part of your cardiovascular risk assessment.  Colorectal cancer can be detected and often prevented. Most routine colorectal cancer screening begins at the age of 65 and continues through age 73. However, your health care provider may recommend screening at an earlier age if you have risk factors for colon cancer. On a yearly basis, your health care provider may provide home test kits to check for hidden blood in the stool. Use of a small camera at the end of a tube to directly examine the colon (sigmoidoscopy or colonoscopy) can detect the earliest forms of colorectal cancer. Talk to your health care provider about this at age 66, when routine screening begins. Direct exam of the colon should be repeated every 5-10 years through age 6, unless early forms of precancerous polyps or small growths are found.  People who are at an increased risk for hepatitis B  should be screened for this virus. You are considered at high risk for hepatitis B if:  You were born in a country where hepatitis B occurs often. Talk with your health care provider about which countries are considered high risk.  Your parents were born in a high-risk country and you have not received a shot to protect against hepatitis B (hepatitis B vaccine).  You have HIV or AIDS.  You use needles to inject street drugs.  You live with, or have sex with, someone who has hepatitis B.  You are a man who has sex with other men (MSM).  You get hemodialysis treatment.  You take certain medicines for conditions such as cancer, organ transplantation, and autoimmune conditions.  Hepatitis C blood testing is recommended for all people born from [birth year] through 1965 and any individual with known risks for hepatitis C.  Practice safe sex.  Use condoms and avoid high-risk sexual practices to reduce the spread of sexually transmitted infections (STIs). STIs include gonorrhea, chlamydia, syphilis, trichomonas, herpes, HPV, and human immunodeficiency virus (HIV). Herpes, HIV, and HPV are viral illnesses that have no cure. They can result in disability, cancer, and death.  If you are a man who has sex with other men, you should be screened at least once per year for:  HIV.  Urethral, rectal, and pharyngeal infection of gonorrhea, chlamydia, or both.  If you are at risk of being infected with HIV, it is recommended that you take a prescription medicine daily to prevent HIV infection. This is called preexposure prophylaxis (PrEP). You are considered at risk if:  You are a man who has sex with other men (MSM) and have other risk factors.  You are a heterosexual man, are sexually active, and are at increased risk for HIV infection.  You take drugs by injection.  You are sexually active with a partner who has HIV.  Talk with your health care provider about whether you are at high risk of being  infected with HIV. If you choose to begin PrEP, you should first be tested for HIV. You should then be tested every 3 months for as long as you are taking PrEP.  A one-time screening for abdominal aortic aneurysm (AAA) and surgical repair of large AAAs by ultrasound are recommended for men ages 23 to 83 years who are current or former smokers.  Healthy men should no longer receive prostate-specific antigen (PSA) blood tests as part of routine cancer screening. Talk with your health care provider about prostate cancer screening.  Testicular cancer screening is not recommended for adult males who have no symptoms. Screening includes self-exam, a health care provider exam, and other screening tests. Consult with your health care provider about any symptoms you have or any concerns you have about testicular cancer.  Use sunscreen. Apply sunscreen liberally and repeatedly throughout the day. You should seek shade when your shadow is shorter than you. Protect yourself by wearing long sleeves, pants, a wide-brimmed hat, and sunglasses year round, whenever you are outdoors.  Once a month, do a whole-body skin exam, using a mirror to look at the skin on your back. Tell your health care provider about new moles, moles that have irregular borders, moles that are larger than a pencil eraser, or moles that have changed in shape or color.  Stay current with required vaccines (immunizations).  Influenza vaccine. All adults should be immunized every year.  Tetanus, diphtheria, and acellular pertussis (Td, Tdap) vaccine. An adult who has not previously received Tdap or who does not know his vaccine status should receive 1 dose of Tdap. This initial dose should be followed by tetanus and diphtheria toxoids (Td) booster doses every 10 years. Adults with an unknown or incomplete history of completing a 3-dose immunization series with Td-containing vaccines should begin or complete a primary immunization series including  a Tdap dose. Adults should receive a Td booster every 10 years.  Varicella vaccine. An adult without evidence of immunity to varicella should receive 2 doses or a second dose if he has previously received 1 dose.  Human papillomavirus (HPV) vaccine. Males aged 11-21 years who have not received the vaccine previously should receive the 3-dose series. Males aged 22-26 years may be immunized. Immunization is recommended through the age of 33 years for any male who has sex with males and did not get any or all doses earlier. Immunization is  recommended for any person with an immunocompromised condition through the age of 93 years if he did not get any or all doses earlier. During the 3-dose series, the second dose should be obtained 4-8 weeks after the first dose. The third dose should be obtained 24 weeks after the first dose and 16 weeks after the second dose.  Zoster vaccine. One dose is recommended for adults aged 66 years or older unless certain conditions are present.  Measles, mumps, and rubella (MMR) vaccine. Adults born before [birth year] generally are considered immune to measles and mumps. Adults born in [birth year] or later should have 1 or more doses of MMR vaccine unless there is a contraindication to the vaccine or there is laboratory evidence of immunity to each of the three diseases. A routine second dose of MMR vaccine should be obtained at least 28 days after the first dose for students attending postsecondary schools, health care workers, or international travelers. People who received inactivated measles vaccine or an unknown type of measles vaccine during 1963-1967 should receive 2 doses of MMR vaccine. People who received inactivated mumps vaccine or an unknown type of mumps vaccine before 1979 and are at high risk for mumps infection should consider immunization with 2 doses of MMR vaccine. Unvaccinated health care workers born before [birth year] who lack laboratory evidence of measles, mumps, or rubella  immunity or laboratory confirmation of disease should consider measles and mumps immunization with 2 doses of MMR vaccine or rubella immunization with 1 dose of MMR vaccine.  Pneumococcal 13-valent conjugate (PCV13) vaccine. When indicated, a person who is uncertain of his immunization history and has no record of immunization should receive the PCV13 vaccine. All adults 29 years of age and older should receive this vaccine. An adult aged 18 years or older who has certain medical conditions and has not been previously immunized should receive 1 dose of PCV13 vaccine. This PCV13 should be followed with a dose of pneumococcal polysaccharide (PPSV23) vaccine. Adults who are at high risk for pneumococcal disease should obtain the PPSV23 vaccine at least 8 weeks after the dose of PCV13 vaccine. Adults older than 65 years of age who have normal immune system function should obtain the PPSV23 vaccine dose at least 1 year after the dose of PCV13 vaccine.  Pneumococcal polysaccharide (PPSV23) vaccine. When PCV13 is also indicated, PCV13 should be obtained first. All adults aged 19 years and older should be immunized. An adult younger than age 46 years who has certain medical conditions should be immunized. Any person who resides in a nursing home or long-term care facility should be immunized. An adult smoker should be immunized. People with an immunocompromised condition and certain other conditions should receive both PCV13 and PPSV23 vaccines. People with human immunodeficiency virus (HIV) infection should be immunized as soon as possible after diagnosis. Immunization during chemotherapy or radiation therapy should be avoided. Routine use of PPSV23 vaccine is not recommended for American Indians, [place] Natives, or people younger than 65 years unless there are medical conditions that require PPSV23 vaccine. When indicated, people who have unknown immunization and have no record of immunization should receive PPSV23  vaccine. One-time revaccination 5 years after the first dose of PPSV23 is recommended for people aged 19-64 years who have chronic kidney failure, nephrotic syndrome, asplenia, or immunocompromised conditions. People who received 1-2 doses of PPSV23 before age 40 years should receive another dose of PPSV23 vaccine at age 37 years or later if at least 5 years have passed since the  previous dose. Doses of PPSV23 are not needed for people immunized with PPSV23 at or after age 44 years.  Meningococcal vaccine. Adults with asplenia or persistent complement component deficiencies should receive 2 doses of quadrivalent meningococcal conjugate (MenACWY-D) vaccine. The doses should be obtained at least 2 months apart. Microbiologists working with certain meningococcal bacteria, [employer] recruits, people at risk during an outbreak, and people who travel to or live in countries with a high rate of meningitis should be immunized. A first-year college student up through age 33 years who is living in a residence hall should receive a dose if he did not receive a dose on or after his 16th birthday. Adults who have certain high-risk conditions should receive one or more doses of vaccine.  Hepatitis A vaccine. Adults who wish to be protected from this disease, have chronic liver disease, work with hepatitis A-infected animals, work in hepatitis A research labs, or travel to or work in countries with a high rate of hepatitis A should be immunized. Adults who were previously unvaccinated and who anticipate close contact with an international adoptee during the first 60 days after arrival in the [place] from a country with a high rate of hepatitis A should be immunized.  Hepatitis B vaccine. Adults should be immunized if they wish to be protected from this disease, are under age 33 years and have diabetes, have chronic liver disease, have had more than one sex partner in the past 6 months, may be exposed to blood or other  infectious body fluids, are household contacts or sex partners of hepatitis B positive people, are clients or workers in certain care facilities, or travel to or work in countries with a high rate of hepatitis B.  Haemophilus influenzae type b (Hib) vaccine. A previously unvaccinated person with asplenia or sickle cell disease or having a scheduled splenectomy should receive 1 dose of Hib vaccine. Regardless of previous immunization, a recipient of a hematopoietic stem cell transplant should receive a 3-dose series 6-12 months after his successful transplant. Hib vaccine is not recommended for adults with HIV infection.   Preventive Service / Frequency Ages 44 and over  Blood pressure check.** / Every year.  Lipid and cholesterol check.**/ Every 5 years beginning at age 18.  Lung cancer screening. / Every year if you are aged 61-80 years and have a 30-pack-year history of smoking and currently smoke or have quit within the past 15 years. Yearly screening is stopped once you have quit smoking for at least 15 years or develop a health problem that would prevent you from having lung cancer treatment.  Fecal occult blood test (FOBT) of stool. / Every year beginning at age 6 and continuing until age 87. You may not have to do this test if you get a colonoscopy every 10 years.  Flexible sigmoidoscopy** or colonoscopy.** / Every 5 years for a flexible sigmoidoscopy or every 10 years for a colonoscopy beginning at age 76 and continuing until age 31.  Hepatitis C blood test.** / For all people born from [birth year] through 1964 and any individual with known risks for hepatitis C.  Abdominal aortic aneurysm (AAA) screening.** / A one-time screening for ages 86 to 63 years who are current or former smokers.  Skin self-exam. / Monthly.  Influenza vaccine. / Every year.  Tetanus, diphtheria, and acellular pertussis (Tdap/Td) vaccine.** / 1 dose of Td every 10 years.  Varicella vaccine.** / Consult your  health care provider.  Zoster vaccine.** / 1 dose  for adults aged 23 years or older.  Pneumococcal 13-valent conjugate (PCV13) vaccine.** / 1 dose for all adults aged 45 years and older.  Pneumococcal polysaccharide (PPSV23) vaccine.** / 1 dose for all adults aged 30 years and older.  Meningococcal vaccine.** / Consult your health care provider.  Hepatitis A vaccine.** / Consult your health care provider.  Hepatitis B vaccine.** / Consult your health care provider.  Haemophilus influenzae type b (Hib) vaccine.** / Consult your health care provider. **Family history and personal history of risk and conditions may change your health care provider's recommendations.   This information is not intended to replace advice given to you by your health care provider. Make sure you discuss any questions you have with your health care provider.   Document Released: 05/06/2001 Document Revised: 03/31/2014 Document Reviewed: 08/05/2010 Elsevier Interactive Patient Education [employer].

## 2017-03-11 NOTE — Progress Notes (Signed)
Male physical  Impression and Recommendations:    1. Encounter for wellness examination   2. History of smoking for 2-5 years   3. Encounter for screening fecal occult blood testing     Plan: 1)  -Discussed with the patient today regarding Shringrix vaccination. Patient was provided with a vaccination information sheet and advised to look over the information and once decided to return to the clinic for the vaccination if he would like the vaccination.   -Patient also advised to call the office if he has any questions after reading the vaccination information sheet.   2)  -Discussed, advised, and recommended a AAA screening to be performed due to a remote history of smoking cigarettes over 30 years ago.   -AAA screening ordered at this visit to be completed within 6 months prior to next OV.    Orders Placed This Encounter  Procedures  . US ABDOMINAL AORTA SCREENING AAA    Epic order Wt 159/no needs Ins - [facility] Bl/Tyler @ [facility]    Standing Status:   Future    Standing Expiration Date:   09/09/2017    Order Specific Question:   Reason for Exam (SYMPTOM  OR DIAGNOSIS REQUIRED)    Answer:   Patient with history of smoking, this is general AAA screening Per USPSTF recommendations    Order Specific Question:   Preferred imaging location?    Answer:   [id number]  . POC Hemoccult Bld/Stl (1-Cd Office Dx)    Please see AVS handed out to patient at the end of our visit for further patient instructions/ counseling done pertaining to today's office visit.  1) Anticipatory Guidance: Discussed importance of wearing a seatbelt while driving, not texting while driving;   sunscreen when outside along with skin surveillance; eating a balanced and modest diet; physical activity at least 25 minutes per day or 150 min/ week moderate to intense activity.  2) Immunizations / Screenings / Labs:  All immunizations are up-to-date per recommendations or will be updated today. Will  obtain CBC, CMP, HgA1c, Lipid panel, TSH and vit D when fasting, if not already done recently.    Gross side effects, risk and benefits, and alternatives of medications discussed with patient.  Patient is aware that all medications have potential side effects and we are unable to predict every side effect or drug-drug interaction that may occur.  Expresses verbal understanding and consents to current therapy plan and treatment regimen.  Follow-up preventative CPE in 1 year and in 6 months for a OV. Follow-up office visit pending lab work.  F/up sooner for chronic care management and/or prn  This document serves as a record of services personally performed by Mellody Dance, MD. It was created on her behalf by Steva Colder, a trained medical scribe. The creation of this record is based on the scribe's personal observations and the provider's statements to them.   I have reviewed the above documentation for accuracy and completeness, and I agree with the above.   Mellody Dance 03/11/17 1:18 PM     Subjective:    CC: CPE  HPI: DARIO YONO is a 66 y.o. male who presents to [facility] today for a yearly health maintenance exam. He reports that he is doing well overall.   -Overall, he denies SOB, nausea, vomit, diarrhea, bowel issues, issues with urinating, urinary hesitancy, nocturia, hematuria, dysuria, blood in stool, hemorrhoids.   -He consumes Metamucil to aid with  bowel movement.    Health Maintenance:  -He went to [facility] on 12/23/2016 for an eye exam. He typically goes for an eye exam every two years.   -He denies having a hearing test completed in the past.   -He has a dentist that he is seen by every 6 months. He denies any oral issues.    -He has a dermatologist and he had an appointment this month that he had to cancel due to external factors. He voices that he will make a follow up appointment soon.   -He is evaluated by his  oncologist, Dr. Marin Olp on a yearly basis for his history of CLL.   -He voices concern for wanting to obtain the Shingles vaccination but voices concern for is it will cause a HSV outbreak. He spoke with his Oncologist regarding this vaccination and was advised to speak further with his PCP regarding the shingles vaccination.   Sexual Health:  -No issues with sexual health and he is monogamous. He denies penile discharge, rash, or lesions, and any other symptoms.    Exercise Management:  -He is not exercising as much due to the recent weather, but notes that he likes to ride bis bicycle.   -He now has a kinetic bicycle that he uses in his home due to the weather.    Health Maintenance Summary Reviewed and updated, unless pt declines services.  Colonoscopy:   09/07/2013, with findings of: Sessile polyp in ascending colon, polypectomy performed. Moderate diverticulosis in the sigmoid colon  Tobacco History Reviewed:   He stopped smoking cigarettes over 30 years ago. He has not had a AAA workup. He smoked less than 1 PPD of cigarettes and smoked less than 10 years total with a total of less than 2 year 1 PPD smoking history.  CT scan for screening lung CA:   N/a Alcohol:    No concerns, no excessive use Exercise Habits:   He uses a kinetic bicycle in his home.  STD concerns:   none Drug Use:   None Testicular/penile concerns:     None   Immunization History  Administered Date(s) Administered  . Influenza Whole 01/22/2013  . Influenza, High Dose Seasonal PF 01/09/2017  . Influenza,inj,Quad PF,6+ Mos 12/26/2015  . Influenza-Unspecified 12/09/2010, 12/27/2013, 12/29/2014  . Pneumococcal Conjugate-13 03/08/2015  . Pneumococcal-Unspecified 02/12/2010  . Tdap 02/13/2011     Health Maintenance  Topic Date Due  . COLONOSCOPY  09/08/2018  . TETANUS/TDAP  02/12/2021  . INFLUENZA VACCINE  Completed  . Hepatitis C Screening  Completed  . HIV Screening  Completed  . PNA vac Low Risk  Adult  Discontinued      Wt Readings from Last 3 Encounters:  03/11/17 159 lb (72.1 kg)  03/04/17 162 lb 8 oz (73.7 kg)  09/02/16 151 lb (68.5 kg)   BP Readings from Last 3 Encounters:  03/11/17 (!) 143/98  03/04/17 (!) 167/90  01/09/17 138/85   Pulse Readings from Last 3 Encounters:  03/11/17 65  03/04/17 64  01/09/17 72    Patient Active Problem List   Diagnosis Date Noted  . Hyperlipidemia, mixed 04/21/2013    Priority: High  . GERD (gastroesophageal reflux disease) 10/22/2010    Priority: High  . Elevated blood pressure reading 04/23/2014    Priority: Medium  . CLL (chronic lymphocytic leukemia) ([place]) 10/14/2011    Priority: Medium  . Muscle cramp 04/21/2013    Priority: Low  . Acute bronchitis 05/23/2015  . Tremor of right hand  04/17/2014  . Encounter for preventive health examination 10/23/2013  . Tick bite 08/04/2013  . Cellulitis 08/04/2013    Past Medical History:  Diagnosis Date  . CLL (chronic lymphocytic leukemia) ([place]) 2009  . Colon polyp   . Elevated BP 04/23/2014  . GERD (gastroesophageal reflux disease)   . High cholesterol   . History of chicken pox    childhood  . Preventative health care 10/23/2013  . Tremor of right hand 04/17/2014    Past Surgical History:  Procedure Laterality Date  . COLONOSCOPY    . ESOPHAGEAL DILATION  2005   [facility]  . HERNIA REPAIR     right groin  . POLYPECTOMY      Family History  Problem Relation Age of Onset  . Hyperlipidemia Mother   . Hypertension Mother        mother-father  . Pulmonary fibrosis Mother   . Diabetes Father   . Heart disease Father        congestive heart failure  . Other Father        brain tumor  . Mental illness Sister        depression  . GER disease Son   . Stroke Maternal Grandmother   . Diabetes Maternal Grandmother   . Diabetes Unknown        mother -father  . Prostate cancer Neg Hx   . Breast cancer Neg Hx   . Colon cancer Neg Hx     Social History   Substance  and Sexual Activity  Drug Use No  ,  Social History   Substance and Sexual Activity  Alcohol Use Yes  . Alcohol/week: 3.6 oz  . Types: 6 Cans of beer per week   Comment: social  ,  Social History   Tobacco Use  Smoking Status Former Smoker  . Packs/day: 1.00  . Years: 22.00  . Pack years: 22.00  . Types: Cigarettes  . Start date: 02/05/1970  . Last attempt to quit: 03/07/1992  . Years since quitting: 25.0  Smokeless Tobacco Never Used  Tobacco Comment   quit 21 years ago  ,  Social History   Substance and Sexual Activity  Sexual Activity Yes   Comment: lives with wife, no dietary restirctions      Medication List        Accurate as of 03/11/17  1:18 PM. Always use your most recent med list.          aspirin 81 MG EC tablet   cholecalciferol 1000 units tablet Commonly known as:  VITAMIN D   CO Q 10 PO   esomeprazole 20 MG capsule Commonly known as:  NEXIUM Take 1 capsule (20 mg total) by mouth daily.   FISH OIL CONCENTRATE PO   GLUCOSAMINE HCL PO   GREEN TEA PO   hydrOXYzine 25 MG tablet Commonly known as:  ATARAX/VISTARIL Take 1 tablet (25 mg total) by mouth every 6 (six) hours.   lactobacillus acidophilus Tabs tablet   MULTI-VITAMIN PO   mupirocin ointment 2 % Commonly known as:  BACTROBAN Apply to affected area TID for 7 days.   NIACIN CR PO   psyllium 58.6 % powder Commonly known as:  METAMUCIL   rosuvastatin 40 MG tablet Commonly known as:  CRESTOR Take 1 tablet (40 mg total) by mouth daily.   SEB-PREV [place] 10 % Liqd Generic drug:  Sulfacetamide Sodium   Zinc 30 MG Tabs       Doxycycline  Review of  Systems:  General:   Denies fever, chills, unexplained weight loss.  Optho/Auditory:   Denies visual changes, blurred vision/LOV Respiratory:   Denies SOB, DOE more than baseline levels.  Cardiovascular:   Denies chest pain, palpitations, new onset peripheral edema  Gastrointestinal:   Denies nausea, vomiting, diarrhea.    Genitourinary: Denies dysuria, freq/ urgency, flank pain or discharge from genitals.  Endocrine:     Denies hot or cold intolerance, polyuria, polydipsia. Musculoskeletal:   Denies unexplained myalgias, joint swelling, unexplained arthralgias, gait problems.  Skin:  Denies rash, suspicious lesions Neurological:     Denies dizziness, unexplained weakness, numbness  Psychiatric/Behavioral:   Denies mood changes, suicidal or homicidal ideations, hallucinations    Objective:     Blood pressure (!) 143/98, pulse 65, height 5' 8.5" (1.74 m), weight 159 lb (72.1 kg), SpO2 99 %. Body mass index is 23.82 kg/m. General Appearance:    Alert, cooperative, no distress, appears stated age  Head:    Normocephalic, without obvious abnormality, atraumatic  Eyes:    PERRL, conjunctiva/corneas clear, EOM's intact, fundi    benign, both eyes  Ears:    Normal TM's and external ear canals, both ears  Nose:   Nares normal, septum midline, mucosa normal, no drainage    or sinus tenderness  Throat:   Lips w/o lesion, mucosa moist, and tongue normal; teeth and   gums normal  Neck:   Supple, symmetrical, trachea midline, no adenopathy;    thyroid:  no enlargement/tenderness/nodules; no carotid   bruit or JVD  Back:     Symmetric, no curvature, ROM normal, no CVA tenderness  Lungs:     Clear to auscultation bilaterally, respirations unlabored, no       Wh/ R/ R  Chest Wall:    No tenderness or gross deformity; normal excursion   Heart:    Regular rate and rhythm, S1 and S2 normal, no murmur, rub   or gallop  Abdomen:     Soft, non-tender, bowel sounds active all four quadrants, NO   G/R/R, no masses, no organomegaly  Genitalia:    CMA chaperone present for exam. Ext genitalia: without lesion, no penile rash or discharge, no hernias appreciated.   Rectal:    Normal tone, no tenderness; guaiac negative stool. Slight enlargement to prostate bilaterally and equal.   Extremities:   Extremities normal, atraumatic,  no cyanosis or gross edema  Pulses:   2+ and symmetric all extremities  Skin:   Warm, dry, Skin color, texture, turgor normal, no obvious rashes or lesions ( ++  M-Sk:   Ambulates * 4 w/o difficulty, no gross deformities, tone WNL  Neurologic:   CNII-XII intact, normal strength, sensation and reflexes    Throughout Psych:  No HI/SI, judgement and insight good, Euthymic mood. Full Affect.

## 2017-03-12 ENCOUNTER — Encounter: Payer: Self-pay | Admitting: Family Medicine

## 2017-03-12 ENCOUNTER — Encounter: Payer: Self-pay | Admitting: Hematology & Oncology

## 2017-03-13 ENCOUNTER — Ambulatory Visit
Admission: RE | Admit: 2017-03-13 | Discharge: 2017-03-13 | Disposition: A | Discharge status: Home / Self Care | Payer: 59 | Source: Ambulatory Visit | Attending: Family Medicine | Admitting: Family Medicine

## 2017-03-13 DIAGNOSIS — Z136 Encounter for screening for cardiovascular disorders: Secondary | ICD-10-CM | POA: Diagnosis not present

## 2017-03-13 DIAGNOSIS — Z87891 Personal history of nicotine dependence: Secondary | ICD-10-CM | POA: Diagnosis not present

## 2017-04-02 MED FILL — ESOMEPRAZOLE MAG DR 20 MG C: 20 | 90 days supply | Qty: 90 | Fill #1

## 2017-04-02 MED FILL — ROSUVASTATIN CALCIUM 40 MG: 40 | 90 days supply | Qty: 90 | Fill #3

## 2017-06-11 ENCOUNTER — Ambulatory Visit (INDEPENDENT_AMBULATORY_CARE_PROVIDER_SITE_OTHER): Payer: No Typology Code available for payment source | Admitting: Family Medicine

## 2017-06-11 ENCOUNTER — Encounter: Payer: Self-pay | Admitting: Family Medicine

## 2017-06-11 VITALS — BP 150/100 | HR 79 | Ht 68.5 in | Wt 158.4 lb

## 2017-06-11 DIAGNOSIS — W57XXXA Bitten or stung by nonvenomous insect and other nonvenomous arthropods, initial encounter: Secondary | ICD-10-CM

## 2017-06-11 DIAGNOSIS — R21 Rash and other nonspecific skin eruption: Secondary | ICD-10-CM | POA: Diagnosis not present

## 2017-06-11 NOTE — Progress Notes (Signed)
Pt here for an acute care OV today   Impression and Recommendations:    1. Tick bite, initial encounter     1. Tick Bite - Keep the area clean.  Keep a band-aid on it, or ideally, keep the area open to air.    - Advised the patient to keep the area clean and dry.  Avoid chafing against the area with his clothing or his skin.  - Continue wound care and do not scratch the area.  Patient advised to continue to apply Bactroban twice a day.  - Directly over the bite wound, warm, moist heat can be applied to draw out anything that remains.  Discussed with the patient that increasing blood flow will help get blood to the area, and flush away anything that lingers in the wound.  - Patient may continue to ice area around the bite for peripheral swelling.  - Advised that the bite area does not appear infected.  However, if the area starts getting red again, and this redness doesn't diminish with treatment and persists, antibiotics may be considered.  - Patient knows to call if things take a turn for the worse.  If he has fever and chills, rash on his body, new muscle aches or soreness, joint soreness - any further symptoms that come up - call in for further assessment.  - Advised patient to read CDC materials about Lyme Disease and [facility] Spotted Fever.  No orders of the defined types were placed in this encounter.   No orders of the defined types were placed in this encounter.    Education and routine counseling performed. Handouts provided  Gross side effects, risk and benefits, and alternatives of medications and treatment plan in general discussed with patient.  Patient is aware that all medications have potential side effects and we are unable to predict every side effect or drug-drug interaction that may occur.   Patient will call with any questions prior to using medication if they have concerns.  Expresses verbal understanding and consents to current therapy and  treatment regimen.  No barriers to understanding were identified.  Red flag symptoms and signs discussed in detail.  Patient expressed understanding regarding what to do in case of emergency\urgent symptoms   Please see AVS handed out to patient at the end of our visit for further patient instructions/ counseling done pertaining to today's office visit.   Return for F-up of current med issues as previously d/c pt.     Note: This document was prepared occasionally using Dragon voice recognition software and may include unintentional dictation errors in addition to a scribe.  This document serves as a record of services personally performed by Mellody Dance, DO. It was created on her behalf by Toni Amend, a trained medical scribe. The creation of this record is based on the scribe's personal observations and the provider's statements to them.   I have reviewed the above medical documentation for accuracy and completeness and I concur.  Mellody Dance 06/11/17 3:57 PM   -----------------------------------------------------------------------------------------------------------------------------------------------------------    Subjective:    CC:  Chief Complaint  Patient presents with  . Tick Removal    HPI: George [name] is a 67 y.o. male who presents to [place] [facility] today for issues as discussed below.  Patient has a tick bite.  Woke up Sunday morning (four days ago) and felt it on his leg.  Believes that one of his dogs might have brought  in the tick.  Denies fevers, chills, nausea, runny nose, new body aches, new joint aches.  Denies swelling, but has redness and puffiness in the region of his leg nearby.  Notes that when he called to make the appointment yesterday, the area was red and itchy.  On examination, rash is not consistent with Lyme Disease rash.  Has been applying an ice pack in the area at night. Has been applying  Bactroban cream and cortisone-10 to the area.   No problems updated.   Wt Readings from Last 3 Encounters:  06/11/17 158 lb 6.4 oz (71.8 kg)  03/11/17 159 lb (72.1 kg)  03/04/17 162 lb 8 oz (73.7 kg)   BP Readings from Last 3 Encounters:  06/11/17 (!) 150/100  03/11/17 (!) 143/98  03/04/17 (!) 167/90   BMI Readings from Last 3 Encounters:  06/11/17 23.73 kg/m  03/11/17 23.82 kg/m  03/04/17 24.35 kg/m     Patient Care Team    Relationship Specialty Notifications Start End  Mellody Dance, DO PCP - General Family Medicine  08/21/15   Devra Dopp, MD Referring Physician Dermatology  04/02/16   Volanda Napoleon, MD Consulting Physician Oncology  04/02/16    Comment: CLL     Patient Active Problem List   Diagnosis Date Noted  . Hyperlipidemia, mixed 04/21/2013    Priority: High  . GERD (gastroesophageal reflux disease) 10/22/2010    Priority: High  . Elevated blood pressure reading 04/23/2014    Priority: Medium  . CLL (chronic lymphocytic leukemia) ([place]) 10/14/2011    Priority: Medium  . Muscle cramp 04/21/2013    Priority: Low  . Acute bronchitis 05/23/2015  . Tremor of right hand 04/17/2014  . Encounter for preventive health examination 10/23/2013  . Tick bite 08/04/2013  . Cellulitis 08/04/2013    Past Medical history, Surgical history, Family history, Social history, Allergies and Medications have been entered into the medical record, reviewed and changed as needed.    Current Meds  Medication Sig  . aspirin 81 MG EC tablet Take 81 mg by mouth daily.    . cholecalciferol (VITAMIN D) 1000 UNITS tablet Take 1,000 Units by mouth daily. 2 tablets daily  . Coenzyme Q10 (CO Q 10 PO) Take by mouth every morning.   Marland Kitchen esomeprazole (NEXIUM) 20 MG capsule Take 1 capsule (20 mg total) by mouth daily.  Marland Kitchen GLUCOSAMINE HCL PO Take by mouth every morning.   Nyoka Cowden Tea, Camillia sinensis, (GREEN TEA PO) Take 315 mg by mouth every morning.   . hydrOXYzine  (ATARAX/VISTARIL) 25 MG tablet Take 1 tablet (25 mg total) by mouth every 6 (six) hours.  . lactobacillus acidophilus (BACID) TABS tablet Take 1 tablet by mouth daily.   . Multiple Vitamin (MULTI-VITAMIN PO) Take by mouth every morning.   . mupirocin ointment (BACTROBAN) 2 % Apply to affected area TID for 7 days.  Marland Kitchen NIACIN CR PO Take 500 mg by mouth daily.   . Omega-3 Fatty Acids (FISH OIL CONCENTRATE PO) Take by mouth every morning.   . psyllium (METAMUCIL) 58.6 % powder Take 1 packet by mouth 2 (two) times daily.   . rosuvastatin (CRESTOR) 40 MG tablet Take 1 tablet (40 mg total) by mouth daily.  . SEB-PREV [place] 10 % LIQD   . Zinc 30 MG TABS Take by mouth every morning.    Allergies:  Allergies  Allergen Reactions  . Doxycycline     Sore throat, red tongue, red skin  Review of Systems: General:   Denies fever, chills, unexplained weight loss.  Optho/Auditory:   Denies visual changes, blurred vision/LOV Respiratory:   Denies wheeze, DOE more than baseline levels.  Cardiovascular:   Denies chest pain, palpitations, new onset peripheral edema  Gastrointestinal:   Denies nausea, vomiting, diarrhea, abd pain.  Genitourinary: Denies dysuria, freq/ urgency, flank pain or discharge from genitals.  Endocrine:     Denies hot or cold intolerance, polyuria, polydipsia. Musculoskeletal:   Denies unexplained myalgias, joint swelling, unexplained arthralgias, gait problems.  Skin:  Denies new onset rash, suspicious lesions Neurological:     Denies dizziness, unexplained weakness, numbness  Psychiatric/Behavioral:   Denies mood changes, suicidal or homicidal ideations, hallucinations    Objective:   Blood pressure (!) 150/100, pulse 79, height 5' 8.5" (1.74 m), weight 158 lb 6.4 oz (71.8 kg), SpO2 99 %. Body mass index is 23.73 kg/m. General:  Well Developed, well nourished, appropriate for stated age.  Neuro:  Alert and oriented,  extra-ocular muscles intact  HEENT:  Normocephalic,  atraumatic, neck supple Skin:  Left upper inner thigh with small erythematous patch surrounding point of entry.  Only 2 mm by 5 mm with slight increased pinkish hue distal to the lesion.  Couple areas surrounding of ecchymosis from scratching hard.  No gross rash, warm, pink. Cardiac:  RRR, S1 S2 Respiratory:  ECTA B/L and A/P, Not using accessory muscles, speaking in full sentences- unlabored. Vascular:  Ext warm, no cyanosis apprec.; cap RF less 2 sec. Psych:  No HI/SI, judgement and insight good, Euthymic mood. Full Affect.

## 2017-06-11 NOTE — Patient Instructions (Signed)
Please see CDC website regarding tick bites for any further patient information.  Please continue with wound care as we discussed.  Please avoid scratching the area especially with your nails, which can have a lot of bacteria underneath them.  -Keep the area clean and dry and as long as the redness does not come back and you continue to slowly improve, no further treatment would be warranted.

## 2017-06-16 MED FILL — TOBRAMYCIN-DEXAMETH OPTH SU: 0.3-0.1 | 25 days supply | Qty: 5 | Fill #0

## 2017-06-22 MED FILL — SODIUM SULFACETAMIDE 10% WA: 10 | 30 days supply | Qty: 355 | Fill #0

## 2017-07-27 ENCOUNTER — Other Ambulatory Visit: Payer: Self-pay | Admitting: Family Medicine

## 2017-07-27 MED FILL — ROSUVASTATIN CALCIUM 40 MG: 40 | 90 days supply | Qty: 90 | Fill #0

## 2017-07-27 MED FILL — ESOMEPRAZOLE MAGNESIUM 20 M: 20 | 90 days supply | Qty: 90 | Fill #2

## 2017-08-07 MED FILL — MUPIROCIN 2% OINTMENT: 2 | 7 days supply | Qty: 22 | Fill #1

## 2017-09-09 ENCOUNTER — Ambulatory Visit (INDEPENDENT_AMBULATORY_CARE_PROVIDER_SITE_OTHER): Payer: No Typology Code available for payment source | Admitting: Family Medicine

## 2017-09-09 ENCOUNTER — Encounter: Payer: Self-pay | Admitting: Family Medicine

## 2017-09-09 VITALS — BP 154/90 | HR 68 | Ht 69.0 in | Wt 160.4 lb

## 2017-09-09 DIAGNOSIS — R740 Nonspecific elevation of levels of transaminase and lactic acid dehydrogenase [LDH]: Secondary | ICD-10-CM | POA: Diagnosis not present

## 2017-09-09 DIAGNOSIS — C9110 Chronic lymphocytic leukemia of B-cell type not having achieved remission: Secondary | ICD-10-CM

## 2017-09-09 DIAGNOSIS — C9190 Lymphoid leukemia, unspecified not having achieved remission: Secondary | ICD-10-CM | POA: Diagnosis not present

## 2017-09-09 DIAGNOSIS — I10 Essential (primary) hypertension: Secondary | ICD-10-CM | POA: Insufficient documentation

## 2017-09-09 DIAGNOSIS — E782 Mixed hyperlipidemia: Secondary | ICD-10-CM | POA: Diagnosis not present

## 2017-09-09 DIAGNOSIS — R7401 Elevation of levels of liver transaminase levels: Secondary | ICD-10-CM

## 2017-09-09 DIAGNOSIS — K219 Gastro-esophageal reflux disease without esophagitis: Secondary | ICD-10-CM

## 2017-09-09 MED ORDER — LOSARTAN POTASSIUM 50 MG PO TABS: 50.0000 mg | ORAL_TABLET | Freq: Every day | ORAL | 3 refills | Status: DC

## 2017-09-09 MED FILL — LOSARTAN POTASSIUM 50 MG TA: 50 | 90 days supply | Qty: 90 | Fill #0

## 2017-09-09 NOTE — Patient Instructions (Signed)
Go to [email or web address].org for more information on hypertension and BP monitoring.

## 2017-09-09 NOTE — Progress Notes (Signed)
Impression and Recommendations:    1. Essential hypertension   2. CLL (chronic lymphocytic leukemia) ([place])   3. Hyperlipidemia, mixed   4. Gastroesophageal reflux disease, esophagitis presence not specified   5. Mildly Elevated AST (SGOT)     1. HTN -Start losartan 50 mg qd. Risk and SE precautions discussed. Answered many questions pt had regarding this medication.  -BP remains elevated above goal. Sx stable. Check your BP at home and keep a log. Bring this into next OV. AHA exercise and dietary guidelines discussed.  -Continue riding your bike. -Goal BP: <130/80 -Fup 6 weeks for HTN after starting losartan. -recheck CMP 6 weeks.    2. CLL -pt has oncologist, Dr. Marin Olp, who follows him closely. Return q yearly (December 2019 next OV with them) per their recommendation.  -white cell count was 47.6 03-05-17. In 06 2018, white count was 49.9  -Check CBC 6 weeks.   3. HLD -FLP all WNL last checked 03-05-17. Tolerating crestor well without complication. -continue crestor.  -prudent diet/exercise discussed.   4. GERD -sx stable. Continue nexium as directed.  5. Mildly elevated AST -check labs 6 weeks.     Orders Placed This Encounter  Procedures  . Comprehensive metabolic panel  . CBC with Differential/Platelet    Meds ordered this encounter  Medications  . losartan (COZAAR) 50 MG tablet    Sig: Take 1 tablet (50 mg total) by mouth daily.    Dispense:  90 tablet    Refill:  3    Gross side effects, risk and benefits, and alternatives of medications and treatment plan in general discussed with patient.  Patient is aware that all medications have potential side effects and we are unable to predict every side effect or drug-drug interaction that may occur.   Patient will call with any questions prior to using medication if they have concerns.  Expresses verbal understanding and consents to current therapy and treatment regimen.  No barriers to understanding were  identified.  Red flag symptoms and signs discussed in detail.  Patient expressed understanding regarding what to do in case of emergency\urgent symptoms  Please see AVS handed out to patient at the end of our visit for further patient instructions/ counseling done pertaining to today's office visit.   Return for (15) started losartan, f/up 6wks reck BP-bring log, will need CMP&CBC.    Note: This note was prepared with assistance of Dragon voice recognition software. Occasional wrong-word or sound-a-like substitutions may have occurred due to the inherent limitations of voice recognition software.  This document serves as a record of services personally performed by Mellody Dance, DO. It was created on her behalf by Mayer Masker, a trained medical scribe. The creation of this record is based on the scribe's personal observations and the provider's statements to them.   I have reviewed the above medical documentation for accuracy and completeness and I concur.  Mellody Dance 09/09/17 9:39 AM  --------------------------------------------------------------------------------------------------------------------------------------------------------------------------------------------------------------------------------------------    Subjective:     HPI: George [name] is a 67 y.o. male who presents to [place] at [facility] today for issues as discussed below.  HTN HPI: -  His blood pressure has not been controlled at home.  Pt is not checking it at home.   - Patient reports good compliance with blood pressure medications  BP recheck: 146/94   - Denies medication S-E   - Smoking Status noted   - He denies new onset of: chest  pain, exercise intolerance, shortness of breath, dizziness, visual changes, headache, lower extremity swelling or claudication.   He rides his bike 25 miles a day (for 2 hours daily).   He eats regularly healthy.  He does not eat a lot of  salt. He notes he eats a lot of boiled eggs with salt and pepper- but it is not excessive.    Last 3 blood pressure readings in our office are as follows: BP Readings from Last 3 Encounters:  09/09/17 (!) 154/90  06/11/17 (!) 150/100  03/11/17 (!) 143/98    Filed Weights   09/09/17 0832  Weight: 160 lb 6.4 oz (72.8 kg)   Wt Readings from Last 3 Encounters:  09/09/17 160 lb 6.4 oz (72.8 kg)  06/11/17 158 lb 6.4 oz (71.8 kg)  03/11/17 159 lb (72.1 kg)   BP Readings from Last 3 Encounters:  09/09/17 (!) 154/90  06/11/17 (!) 150/100  03/11/17 (!) 143/98   Pulse Readings from Last 3 Encounters:  09/09/17 68  06/11/17 79  03/11/17 65   BMI Readings from Last 3 Encounters:  09/09/17 23.69 kg/m  06/11/17 23.73 kg/m  03/11/17 23.82 kg/m     Patient Care Team    Relationship Specialty Notifications Start End  Mellody Dance, DO PCP - General Family Medicine  08/21/15   Devra Dopp, MD Referring Physician Dermatology  04/02/16   Volanda Napoleon, MD Consulting Physician Oncology  04/02/16    Comment: CLL     Patient Active Problem List   Diagnosis Date Noted  . Essential hypertension 09/09/2017    Priority: High  . Hyperlipidemia, mixed 04/21/2013    Priority: High  . Mildly Elevated AST (SGOT) 09/09/2017    Priority: Medium  . CLL (chronic lymphocytic leukemia) ([place]) 10/14/2011    Priority: Medium  . Muscle cramp 04/21/2013    Priority: Low  . GERD (gastroesophageal reflux disease) 10/22/2010    Priority: Low  . Acute bronchitis 05/23/2015  . Tremor of right hand 04/17/2014  . Encounter for preventive health examination 10/23/2013  . Tick bite 08/04/2013  . Cellulitis 08/04/2013    Past Medical history, Surgical history, Family history, Social history, Allergies and Medications have been entered into the medical record, reviewed and changed as needed.    Current Meds  Medication Sig  . aspirin 81 MG EC tablet Take 81 mg by mouth daily.    .  cholecalciferol (VITAMIN D) 1000 UNITS tablet Take 1,000 Units by mouth daily. 2 tablets daily  . Coenzyme Q10 (CO Q 10 PO) Take by mouth every morning.   Marland Kitchen esomeprazole (NEXIUM) 20 MG capsule Take 1 capsule (20 mg total) by mouth daily.  Marland Kitchen GLUCOSAMINE HCL PO Take by mouth every morning.   Nyoka Cowden Tea, Camillia sinensis, (GREEN TEA PO) Take 315 mg by mouth every morning.   . lactobacillus acidophilus (BACID) TABS tablet Take 1 tablet by mouth daily.   . Multiple Vitamin (MULTI-VITAMIN PO) Take by mouth every morning.   . mupirocin ointment (BACTROBAN) 2 % Apply to affected area TID for 7 days.  Marland Kitchen NIACIN CR PO Take 500 mg by mouth daily.   . Omega-3 Fatty Acids (FISH OIL CONCENTRATE PO) Take by mouth every morning.   . psyllium (METAMUCIL) 58.6 % powder Take 1 packet by mouth 2 (two) times daily.   . rosuvastatin (CRESTOR) 40 MG tablet TAKE 1 TABLET (40 MG TOTAL) BY MOUTH DAILY.  . SEB-PREV WASH 10 % LIQD   . Zinc 30  MG TABS Take by mouth every morning.    Allergies:  Allergies  Allergen Reactions  . Doxycycline     Sore throat, red tongue, red skin     Review of Systems:  A fourteen system review of systems was performed and found to be positive as per HPI.   Objective:   Blood pressure (!) 154/90, pulse 68, height 5\' 9"  (1.753 m), weight 160 lb 6.4 oz (72.8 kg), SpO2 98 %. Body mass index is 23.69 kg/m. General:  Well Developed, well nourished, appropriate for stated age.  Neuro:  Alert and oriented,  extra-ocular muscles intact  HEENT:  Normocephalic, atraumatic, neck supple, no carotid bruits appreciated  Skin:  no gross rash, warm, pink. Cardiac:  RRR, S1 S2 Respiratory:  ECTA B/L and A/P, Not using accessory muscles, speaking in full sentences- unlabored. Vascular:  Ext warm, no cyanosis apprec.; cap RF less 2 sec. Psych:  No HI/SI, judgement and insight good, Euthymic mood. Full Affect.

## 2017-10-26 ENCOUNTER — Other Ambulatory Visit: Payer: No Typology Code available for payment source

## 2017-10-26 DIAGNOSIS — I10 Essential (primary) hypertension: Secondary | ICD-10-CM

## 2017-10-26 DIAGNOSIS — C9110 Chronic lymphocytic leukemia of B-cell type not having achieved remission: Secondary | ICD-10-CM

## 2017-10-26 DIAGNOSIS — E782 Mixed hyperlipidemia: Secondary | ICD-10-CM

## 2017-10-26 DIAGNOSIS — R740 Nonspecific elevation of levels of transaminase and lactic acid dehydrogenase [LDH]: Secondary | ICD-10-CM

## 2017-10-26 DIAGNOSIS — R7401 Elevation of levels of liver transaminase levels: Secondary | ICD-10-CM

## 2017-10-27 LAB — COMPREHENSIVE METABOLIC PANEL
A/G RATIO: 2 (ref 1.2–2.2)
ALBUMIN: 4.7 g/dL (ref 3.6–4.8)
ALT: 21 IU/L (ref 0–44)
AST: 34 IU/L (ref 0–40)
Alkaline Phosphatase: 65 IU/L (ref 39–117)
BILIRUBIN TOTAL: 0.6 mg/dL (ref 0.0–1.2)
BUN / CREAT RATIO: 10 (ref 10–24)
BUN: 8 mg/dL (ref 8–27)
CALCIUM: 9.6 mg/dL (ref 8.6–10.2)
CO2: 24 mmol/L (ref 20–29)
Chloride: 98 mmol/L (ref 96–106)
Creatinine, Ser: 0.81 mg/dL (ref 0.76–1.27)
GFR, EST AFRICAN AMERICAN: 107 mL/min/{1.73_m2} (ref >59)
GFR, EST NON AFRICAN AMERICAN: 93 mL/min/{1.73_m2} (ref >59)
GLOBULIN, TOTAL: 2.3 g/dL (ref 1.5–4.5)
Glucose: 89 mg/dL (ref 65–99)
POTASSIUM: 4 mmol/L (ref 3.5–5.2)
SODIUM: 138 mmol/L (ref 134–144)
TOTAL PROTEIN: 7 g/dL (ref 6.0–8.5)

## 2017-10-27 LAB — CBC WITH DIFFERENTIAL/PLATELET
BASOS ABS: 0.1 10*3/uL (ref 0.0–0.2)
BASOS: 0 %
EOS (ABSOLUTE): 0.1 10*3/uL (ref 0.0–0.4)
Eos: 0 %
HEMATOCRIT: 40.1 % (ref 37.5–51.0)
Hemoglobin: 13.4 g/dL (ref 13.0–17.7)
IMMATURE GRANS (ABS): 0 10*3/uL (ref 0.0–0.1)
Immature Granulocytes: 0 %
LYMPHS ABS: 38.5 10*3/uL — AB (ref 0.7–3.1)
Lymphs: 89 %
MCH: 31.2 pg (ref 26.6–33.0)
MCHC: 33.4 g/dL (ref 31.5–35.7)
MCV: 94 fL (ref 79–97)
MONOCYTES: 2 %
Monocytes Absolute: 0.8 10*3/uL (ref 0.1–0.9)
NEUTROS ABS: 3.9 10*3/uL (ref 1.4–7.0)
Neutrophils: 9 %
PLATELETS: 205 10*3/uL (ref 150–450)
RBC: 4.29 x10E6/uL (ref 4.14–5.80)
RDW: 13.6 % (ref 12.3–15.4)
WBC: 43.3 10*3/uL — AB (ref 3.4–10.8)

## 2017-11-02 ENCOUNTER — Ambulatory Visit (INDEPENDENT_AMBULATORY_CARE_PROVIDER_SITE_OTHER): Payer: No Typology Code available for payment source | Admitting: Family Medicine

## 2017-11-02 ENCOUNTER — Encounter: Payer: Self-pay | Admitting: Hematology & Oncology

## 2017-11-02 ENCOUNTER — Encounter: Payer: Self-pay | Admitting: Family Medicine

## 2017-11-02 VITALS — BP 132/87 | HR 65 | Ht 68.5 in | Wt 154.4 lb

## 2017-11-02 DIAGNOSIS — I10 Essential (primary) hypertension: Secondary | ICD-10-CM | POA: Diagnosis not present

## 2017-11-02 NOTE — Patient Instructions (Signed)
Patient declined AVS 

## 2017-11-02 NOTE — Progress Notes (Signed)
Impression and Recommendations:    1. Essential hypertension    1. Hypertension - Last appointment, losartan dose increased from 25 to 50. - Patient tolerating meds well without complication.  Denies S-E - Blood pressure well controlled at this time. - Recent labs reviewed with patient to include CMP and CBC.  Stable - Continue treatment plan as prescribed.  See med list below.  - Reviewed red flag symptoms of low blood pressure with patient today, and advised patient to inform us if he experiences dizziness or lightheadedness on his treatment plan.  - Encouraged patient to check blood pressure if experiencing unusual stress, visual changes, lightheadedness, dizziness.  - Ambulatory BP monitoring encouraged. Keep log and bring in next OV  2. Aspirin Use & Patient h/o CLL - Patient discontinued use of daily baby aspirin recently. - Strongly encouraged patient to ask Dr. Marin Olp about recommendations on need to take aspirin given patient h/o CLL.  - Per pt, returns to Dr. Marin Olp in December.  3. BMI Counseling Explained to patient what BMI refers to, and what it means medically.    Told patient to think about it as a "medical risk stratification measurement" and how increasing BMI is associated with increasing risk/ or worsening state of various diseases such as hypertension, hyperlipidemia, diabetes, premature OA, depression etc.  American Heart Association guidelines for healthy diet, basically Mediterranean diet, and exercise guidelines of 30 minutes 5 days per week or more discussed in detail.  Health counseling performed.  All questions answered.  4. Lifestyle & Preventative Health Maintenance - Advised patient to continue working toward exercising to improve overall mental, physical, and emotional health.    - Continue prudent lifestyle habits and continue engaging in daily physical activity, especially a formal exercise routine.  Encouraged patient to continue his  daily hours of bike riding.   - Recommended that the patient eventually strive for at least 150 minutes of moderate cardiovascular activity per week according to guidelines established by the [facility].   - Healthy dietary habits encouraged, including low-carb, and high amounts of lean protein in diet.   - Patient should also consume adequate amounts of water - half of body weight in oz of water per day.   Education and routine counseling performed. Handouts provided.  5. Follow-Up - Return for regularly scheduled chronic follow-up.  - Follow-up mid-December for CPE and fasting lab work.  - Otherwise, return to clinic PRN for acute concerns or if concerned about new symptoms.   Medications Discontinued During This Encounter  Medication Reason  . aspirin 81 MG EC tablet Patient Preference     The patient was counseled, risk factors were discussed, anticipatory guidance given.  Gross side effects, risk and benefits, and alternatives of medications discussed with patient.  Patient is aware that all medications have potential side effects and we are unable to predict every side effect or drug-drug interaction that may occur.  Expresses verbal understanding and consents to current therapy plan and treatment regimen.  Return for Patient due to come in for yearly physical around mid December-will obtain fasting blood work.  Please see AVS handed out to patient at the end of our visit for further patient instructions/ counseling done pertaining to today's office visit.    Note:  This document was prepared using Dragon voice recognition software and may include unintentional dictation errors.  This document serves as a record of services personally performed by Mellody Dance, DO. It was created on her behalf  by Toni Amend, a trained medical scribe. The creation of this record is based on the scribe's personal observations and the provider's statements to them.   I have reviewed the  above medical documentation for accuracy and completeness and I concur.  Mellody Dance 11/02/17 9:04 AM    Subjective:    HPI: George [name] is a 67 y.o. male who presents to [place] at [facility] today for follow up for HTN.    Losartan dose increased from 25 to 50 last appointment.  Patient is very pleased with this change.    Does have low blood pressure at times, but denies red flag symptoms.  States he "just feels very relaxed."  Continues with nearly two hours of cardio per day.  Patient loves bike riding.  Notes that he discontinued aspirin recently.  Denies family history of colon cancer.  HTN:  -  His blood pressure has been well controlled at home.   Patient comes to appointment today with extensive log.  Blood Pressure at Home: 419-622 systolic / 29-79 diastolic on average.  - Patient reports good compliance with blood pressure medications  - Denies medication S-E - notes he can't tell any difference on the new medication; he feels good.   - Smoking Status noted   - He denies new onset of: chest pain, exercise intolerance, shortness of breath, dizziness, visual changes, headache, lower extremity swelling or claudication.    Last 3 blood pressure readings in our office are as follows: BP Readings from Last 3 Encounters:  11/02/17 132/87  09/09/17 (!) 154/90  06/11/17 (!) 150/100    Pulse Readings from Last 3 Encounters:  11/02/17 65  09/09/17 68  06/11/17 79    Filed Weights   11/02/17 0845  Weight: 154 lb 6.4 oz (70 kg)      Patient Care Team    Relationship Specialty Notifications Start End  Mellody Dance, DO PCP - General Family Medicine  08/21/15   Devra Dopp, MD Referring Physician Dermatology  04/02/16   Volanda Napoleon, MD Consulting Physician Oncology  04/02/16    Comment: CLL     Lab Results  Component Value Date   CREATININE 0.81 10/26/2017   BUN 8 10/26/2017   NA 138 10/26/2017   K 4.0  10/26/2017   CL 98 10/26/2017   CO2 24 10/26/2017    Lab Results  Component Value Date   CHOL 183 03/05/2017   CHOL 206 (H) 08/26/2016   CHOL 215 (H) 10/15/2015    Lab Results  Component Value Date   HDL 77 03/05/2017   HDL 88 08/26/2016   HDL 75 10/15/2015    Lab Results  Component Value Date   LDLCALC 97 03/05/2017   LDLCALC 108 (H) 08/26/2016   LDLCALC 124 10/15/2015    Lab Results  Component Value Date   TRIG 45 03/05/2017   TRIG 51 08/26/2016   TRIG 78 10/15/2015    Lab Results  Component Value Date   CHOLHDL 2.4 03/05/2017   CHOLHDL 2.3 08/26/2016   CHOLHDL 2.9 10/15/2015    No results found for: LDLDIRECT ===================================================================   Patient Active Problem List   Diagnosis Date Noted  . Essential hypertension 09/09/2017    Priority: High  . Hyperlipidemia, mixed 04/21/2013    Priority: High  . Mildly Elevated AST (SGOT) 09/09/2017    Priority: Medium  . CLL (chronic lymphocytic leukemia) ([place]) 10/14/2011    Priority: Medium  . Muscle cramp 04/21/2013  Priority: Low  . GERD (gastroesophageal reflux disease) 10/22/2010    Priority: Low  . Acute bronchitis 05/23/2015  . Tremor of right hand 04/17/2014  . Encounter for preventive health examination 10/23/2013  . Tick bite 08/04/2013  . Cellulitis 08/04/2013     Past Medical History:  Diagnosis Date  . CLL (chronic lymphocytic leukemia) ([place]) 2009  . Colon polyp   . Elevated BP 04/23/2014  . GERD (gastroesophageal reflux disease)   . High cholesterol   . History of chicken pox    childhood  . Preventative health care 10/23/2013  . Tremor of right hand 04/17/2014     Past Surgical History:  Procedure Laterality Date  . COLONOSCOPY    . ESOPHAGEAL DILATION  2005   [facility]  . HERNIA REPAIR     right groin  . POLYPECTOMY       Family History  Problem Relation Age of Onset  . Hyperlipidemia Mother   . Hypertension Mother         mother-father  . Pulmonary fibrosis Mother   . Diabetes Father   . Heart disease Father        congestive heart failure  . Other Father        brain tumor  . Mental illness Sister        depression  . GER disease Son   . Stroke Maternal Grandmother   . Diabetes Maternal Grandmother   . Diabetes Unknown        mother -father  . Prostate cancer Neg Hx   . Breast cancer Neg Hx   . Colon cancer Neg Hx      Social History   Substance and Sexual Activity  Drug Use No  ,  Social History   Substance and Sexual Activity  Alcohol Use Yes  . Alcohol/week: 6.0 standard drinks  . Types: 6 Cans of beer per week   Comment: social  ,  Social History   Tobacco Use  Smoking Status Former Smoker  . Packs/day: 1.00  . Years: 22.00  . Pack years: 22.00  . Types: Cigarettes  . Start date: 02/05/1970  . Last attempt to quit: 03/07/1992  . Years since quitting: 25.6  Smokeless Tobacco Never Used  Tobacco Comment   quit 21 years ago  ,    Current Outpatient Medications on File Prior to Visit  Medication Sig Dispense Refill  . cholecalciferol (VITAMIN D) 1000 UNITS tablet Take 1,000 Units by mouth daily. 2 tablets daily    . Coenzyme Q10 (CO Q 10 PO) Take by mouth every morning.     Marland Kitchen esomeprazole (NEXIUM) 20 MG capsule Take 1 capsule (20 mg total) by mouth daily. 90 capsule 3  . GLUCOSAMINE HCL PO Take by mouth every morning.     Nyoka Cowden Tea, Camillia sinensis, (GREEN TEA PO) Take 315 mg by mouth every morning.     . lactobacillus acidophilus (BACID) TABS tablet Take 1 tablet by mouth daily.     Marland Kitchen losartan (COZAAR) 50 MG tablet Take 1 tablet (50 mg total) by mouth daily. 90 tablet 3  . Multiple Vitamin (MULTI-VITAMIN PO) Take by mouth every morning.     . mupirocin ointment (BACTROBAN) 2 % Apply to affected area TID for 7 days. 30 g 3  . NIACIN CR PO Take 500 mg by mouth daily.     . Omega-3 Fatty Acids (FISH OIL CONCENTRATE PO) Take by mouth every morning.     . psyllium  (  METAMUCIL) 58.6 % powder Take 1 packet by mouth 2 (two) times daily.     . rosuvastatin (CRESTOR) 40 MG tablet TAKE 1 TABLET (40 MG TOTAL) BY MOUTH DAILY. 90 tablet 0  . SEB-PREV WASH 10 % LIQD   3  . Zinc 30 MG TABS Take by mouth every morning.     No current facility-administered medications on file prior to visit.      Allergies  Allergen Reactions  . Doxycycline     Sore throat, red tongue, red skin     Review of Systems:   General:  Denies fever, chills Optho/Auditory:   Denies visual changes, blurred vision Respiratory:   Denies SOB, cough, wheeze, DIB  Cardiovascular:   Denies chest pain, palpitations, painful respirations Gastrointestinal:   Denies nausea, vomiting, diarrhea.  Endocrine:     Denies new hot or cold intolerance Musculoskeletal:  Denies joint swelling, gait issues, or new unexplained myalgias/ arthralgias Skin:  Denies rash, suspicious lesions  Neurological:    Denies dizziness, unexplained weakness, numbness  Psychiatric/Behavioral:   Denies mood changes  Objective:    Blood pressure 132/87, pulse 65, height 5' 8.5" (1.74 m), weight 154 lb 6.4 oz (70 kg), SpO2 98 %.  Body mass index is 23.13 kg/m.  General: Well Developed, well nourished, and in no acute distress.  HEENT: Normocephalic, atraumatic, pupils equal round reactive to light, neck supple, No carotid bruits, no JVD Skin: Warm and dry, cap RF less 2 sec Cardiac: Regular rate and rhythm, S1, S2 WNL's, no murmurs rubs or gallops Respiratory: ECTA B/L, Not using accessory muscles, speaking in full sentences. NeuroM-Sk: Ambulates w/o assistance, moves ext * 4 w/o difficulty, sensation grossly intact.  Ext: scant edema b/l lower ext Psych: No HI/SI, judgement and insight good, Euthymic mood. Full Affect.

## 2017-12-01 ENCOUNTER — Other Ambulatory Visit: Payer: Self-pay | Admitting: Family Medicine

## 2017-12-01 DIAGNOSIS — K219 Gastro-esophageal reflux disease without esophagitis: Secondary | ICD-10-CM

## 2017-12-01 MED FILL — ROSUVASTATIN CALCIUM 40 MG: 40 | 90 days supply | Qty: 90 | Fill #0

## 2017-12-01 MED FILL — ESOMEPRAZOLE MAGNESIUM 20 M: 20 | 90 days supply | Qty: 90 | Fill #0

## 2018-01-06 ENCOUNTER — Ambulatory Visit (INDEPENDENT_AMBULATORY_CARE_PROVIDER_SITE_OTHER): Payer: No Typology Code available for payment source

## 2018-01-06 VITALS — BP 146/87 | HR 81

## 2018-01-06 DIAGNOSIS — Z23 Encounter for immunization: Secondary | ICD-10-CM

## 2018-01-06 NOTE — Progress Notes (Signed)
Pt here for influenza vaccine.  Screening questionnaire reviewed, VIS provided to patient, and any/all patient questions answered.  T. Nelson, CMA  

## 2018-01-13 MED FILL — LOSARTAN POTASSIUM 50 MG TA: 50 | 30 days supply | Qty: 30 | Fill #1

## 2018-01-28 ENCOUNTER — Telehealth: Payer: Self-pay | Admitting: Family Medicine

## 2018-01-28 NOTE — Telephone Encounter (Signed)
Patient is on the [facility] Focus plan and needs referrals created for tracking purposes:  I need an referral for Dr. Marin Olp (patient is already scheduled for this)  I also need a derm referral for [facility], patient will be seeing Dr. Renda Rolls

## 2018-01-29 ENCOUNTER — Other Ambulatory Visit: Payer: Self-pay

## 2018-01-29 DIAGNOSIS — C9110 Chronic lymphocytic leukemia of B-cell type not having achieved remission: Secondary | ICD-10-CM

## 2018-01-29 DIAGNOSIS — L0390 Cellulitis, unspecified: Secondary | ICD-10-CM

## 2018-01-29 DIAGNOSIS — W57XXXA Bitten or stung by nonvenomous insect and other nonvenomous arthropods, initial encounter: Secondary | ICD-10-CM

## 2018-02-01 MED FILL — SODIUM SULFACETAMIDE 10% WA: 10 | 30 days supply | Qty: 355 | Fill #0

## 2018-02-17 MED FILL — LOSARTAN POTASSIUM 50 MG TA: 50 | 30 days supply | Qty: 30 | Fill #2

## 2018-03-03 ENCOUNTER — Encounter: Payer: Self-pay | Admitting: Hematology & Oncology

## 2018-03-03 ENCOUNTER — Inpatient Hospital Stay
Payer: No Typology Code available for payment source | Attending: Hematology & Oncology | Admitting: Hematology & Oncology

## 2018-03-03 ENCOUNTER — Telehealth: Payer: Self-pay | Admitting: *Deleted

## 2018-03-03 ENCOUNTER — Other Ambulatory Visit: Payer: Self-pay

## 2018-03-03 ENCOUNTER — Inpatient Hospital Stay: Payer: No Typology Code available for payment source

## 2018-03-03 VITALS — BP 138/82 | HR 87 | Temp 98.0°F | Resp 20 | Wt 163.8 lb

## 2018-03-03 DIAGNOSIS — C9110 Chronic lymphocytic leukemia of B-cell type not having achieved remission: Secondary | ICD-10-CM | POA: Insufficient documentation

## 2018-03-03 DIAGNOSIS — Z79899 Other long term (current) drug therapy: Secondary | ICD-10-CM | POA: Diagnosis not present

## 2018-03-03 LAB — CMP (CANCER CENTER ONLY)
ALBUMIN: 4.5 g/dL (ref 3.5–5.0)
ALK PHOS: 66 U/L (ref 38–126)
ALT: 21 U/L (ref 0–44)
AST: 35 U/L (ref 15–41)
Anion gap: 8 (ref 5–15)
BILIRUBIN TOTAL: 1 mg/dL (ref 0.3–1.2)
BUN: 9 mg/dL (ref 8–23)
CHLORIDE: 101 mmol/L (ref 98–111)
CO2: 27 mmol/L (ref 22–32)
Calcium: 9.5 mg/dL (ref 8.9–10.3)
Creatinine: 0.84 mg/dL (ref 0.61–1.24)
GFR, Estimated: >60 mL/min (ref >60)
GLUCOSE: 92 mg/dL (ref 70–99)
Potassium: 4.6 mmol/L (ref 3.5–5.1)
SODIUM: 136 mmol/L (ref 135–145)
TOTAL PROTEIN: 6.9 g/dL (ref 6.5–8.1)

## 2018-03-03 LAB — CBC WITH DIFFERENTIAL (CANCER CENTER ONLY)
Abs Immature Granulocytes: 0.12 10*3/uL — ABNORMAL HIGH (ref 0.00–0.07)
BASOS PCT: 0 %
Basophils Absolute: 0.1 10*3/uL (ref 0.0–0.1)
EOS PCT: 0 %
Eosinophils Absolute: 0.1 10*3/uL (ref 0.0–0.5)
HEMATOCRIT: 44.2 % (ref 39.0–52.0)
Hemoglobin: 13.7 g/dL (ref 13.0–17.0)
IMMATURE GRANULOCYTES: 0 %
LYMPHS PCT: 90 %
Lymphs Abs: 59 10*3/uL — ABNORMAL HIGH (ref 0.7–4.0)
MCH: 29.5 pg (ref 26.0–34.0)
MCHC: 31 g/dL (ref 30.0–36.0)
MCV: 95.1 fL (ref 80.0–100.0)
MONOS PCT: 3 %
Monocytes Absolute: 2 10*3/uL — ABNORMAL HIGH (ref 0.1–1.0)
NEUTROS PCT: 7 %
NRBC: 0 % (ref 0.0–0.2)
Neutro Abs: 4.8 10*3/uL (ref 1.7–7.7)
Platelet Count: 230 10*3/uL (ref 150–400)
RBC: 4.65 MIL/uL (ref 4.22–5.81)
RDW: 12.5 % (ref 11.5–15.5)
WBC Count: 66.1 10*3/uL (ref 4.0–10.5)

## 2018-03-03 LAB — SAVE SMEAR (SSMR)

## 2018-03-03 NOTE — Telephone Encounter (Signed)
Panic WBC 66.1 reported to me by Richardson Landry in lab.  Dr. Marin Olp notified.  Dr. Johnette Abraham seeing patient in room now.

## 2018-03-03 NOTE — Progress Notes (Signed)
Hematology and Oncology Follow Up Visit  George [name] [id number] [birth year] 68 y.o. 03/03/2018   Principle Diagnosis:   CLL-stage A  Current Therapy:    Observation     Interim History:  Mr.  George [name] is back for followup.  As always, he is doing well.  We see him yearly.  He and his wife always go on a vacation out [place].  This time, they went to [place].  He has had no problems with swollen lymph nodes.  He has had no fever.  He has had no cough or shortness of breath.  He has had no change in bowel or bladder habits.  There is been no rashes.  He has had no leg swelling.  Overall, his performance status is ECOG 0.  Medications:  Current Outpatient Medications:  .  cholecalciferol (VITAMIN D) 1000 UNITS tablet, Take 1,000 Units by mouth daily. 2 tablets daily, Disp: , Rfl:  .  Coenzyme Q10 (CO Q 10 PO), Take by mouth every morning. , Disp: , Rfl:  .  esomeprazole (NEXIUM) 20 MG capsule, TAKE 1 CAPSULE BY MOUTH ONCE DAILY, Disp: 90 capsule, Rfl: 1 .  GLUCOSAMINE HCL PO, Take by mouth every morning. , Disp: , Rfl:  .  Green Tea, Camillia sinensis, (GREEN TEA PO), Take 315 mg by mouth every morning. , Disp: , Rfl:  .  lactobacillus acidophilus (BACID) TABS tablet, Take 1 tablet by mouth daily. , Disp: , Rfl:  .  losartan (COZAAR) 50 MG tablet, Take 1 tablet (50 mg total) by mouth daily., Disp: 90 tablet, Rfl: 3 .  Multiple Vitamin (MULTI-VITAMIN PO), Take by mouth every morning. , Disp: , Rfl:  .  NIACIN CR PO, Take 500 mg by mouth daily. , Disp: , Rfl:  .  Omega-3 Fatty Acids (FISH OIL CONCENTRATE PO), Take by mouth every morning. , Disp: , Rfl:  .  psyllium (METAMUCIL) 58.6 % powder, Take 1 packet by mouth 2 (two) times daily. , Disp: , Rfl:  .  rosuvastatin (CRESTOR) 40 MG tablet, TAKE 1 TABLET (40 MG TOTAL) BY MOUTH DAILY., Disp: 90 tablet, Rfl: 1 .  SEB-PREV WASH 10 % LIQD, , Disp: , Rfl: 3 .  Zinc 30 MG TABS, Take by mouth every morning., Disp: , Rfl:  .  mupirocin  ointment (BACTROBAN) 2 %, Apply to affected area TID for 7 days. (Patient not taking: Reported on 03/03/2018), Disp: 30 g, Rfl: 3  Allergies:  Allergies  Allergen Reactions  . Doxycycline     Sore throat, red tongue, red skin    Past Medical History, Surgical history, Social history, and Family History were reviewed and updated.  Review of Systems: Review of Systems  Constitutional: Negative.   HENT: Negative.   Eyes: Negative.   Respiratory: Negative.   Cardiovascular: Negative.   Gastrointestinal: Negative.   Genitourinary: Negative.   Musculoskeletal: Negative.   Skin: Negative.   Neurological: Negative.   Endo/Heme/Allergies: Negative.   Psychiatric/Behavioral: Negative.      Physical Exam:  weight is 163 lb 12.8 oz (74.3 kg). His oral temperature is 98 F (36.7 C). His blood pressure is 138/82 and his pulse is 87. His respiration is 20 and oxygen saturation is 99%.   Physical Exam  Constitutional: He is oriented to person, place, and time.  HENT:  Head: Normocephalic and atraumatic.  Mouth/Throat: Oropharynx is clear and moist.  Eyes: Pupils are equal, round, and reactive to light. EOM are normal.  Neck: Normal range of  motion.  Cardiovascular: Normal rate, regular rhythm and normal heart sounds.  Pulmonary/Chest: Effort normal and breath sounds normal.  Abdominal: Soft. Bowel sounds are normal.  Musculoskeletal: Normal range of motion. He exhibits no edema, tenderness or deformity.  Lymphadenopathy:    He has no cervical adenopathy.  Neurological: He is alert and oriented to person, place, and time.  Skin: Skin is warm and dry. No rash noted. No erythema.  Psychiatric: He has a normal mood and affect. His behavior is normal. Judgment and thought content normal.  Vitals reviewed.    Lab Results  Component Value Date   WBC 66.1 (HH) 03/03/2018   HGB 13.7 03/03/2018   HCT 44.2 03/03/2018   MCV 95.1 03/03/2018   PLT 230 03/03/2018     Chemistry       Component Value Date/Time   NA 136 03/03/2018 0737   NA 138 10/26/2017 0823   K 4.6 03/03/2018 0737   CL 101 03/03/2018 0737   CO2 27 03/03/2018 0737   BUN 9 03/03/2018 0737   BUN 8 10/26/2017 0823   CREATININE 0.84 03/03/2018 0737   CREATININE 0.73 10/15/2015 0909      Component Value Date/Time   CALCIUM 9.5 03/03/2018 0737   ALKPHOS 66 03/03/2018 0737   AST 35 03/03/2018 0737   ALT 21 03/03/2018 0737   BILITOT 1.0 03/03/2018 0737         Impression and Plan: Mr. George [name] is a 67 year old gentleman. He has CLL.  It does look like his disease might be more active now.  We have been seeing him for about 12 years.  It is not a surprise that the white cell count is up.  I will see him back in 6 months.  If his white cell count is further elevated in 6 months, then I think will have to go to [facility] for therapy.  I looked at his blood under the microscope.  He had smudge cells.  I do not see anything with his lymphocytes that looked like they were trying to transform.     Volanda Napoleon, MD 12/11/20198:17 AM

## 2018-03-12 ENCOUNTER — Encounter: Payer: Self-pay | Admitting: Family Medicine

## 2018-03-12 ENCOUNTER — Ambulatory Visit (INDEPENDENT_AMBULATORY_CARE_PROVIDER_SITE_OTHER): Payer: No Typology Code available for payment source | Admitting: Family Medicine

## 2018-03-12 VITALS — BP 128/80 | HR 71 | Temp 98.0°F | Ht 69.0 in | Wt 165.0 lb

## 2018-03-12 DIAGNOSIS — E782 Mixed hyperlipidemia: Secondary | ICD-10-CM

## 2018-03-12 DIAGNOSIS — Z1211 Encounter for screening for malignant neoplasm of colon: Secondary | ICD-10-CM

## 2018-03-12 DIAGNOSIS — I10 Essential (primary) hypertension: Secondary | ICD-10-CM | POA: Diagnosis not present

## 2018-03-12 DIAGNOSIS — Z0000 Encounter for general adult medical examination without abnormal findings: Secondary | ICD-10-CM | POA: Diagnosis not present

## 2018-03-12 DIAGNOSIS — R7401 Elevation of levels of liver transaminase levels: Secondary | ICD-10-CM

## 2018-03-12 DIAGNOSIS — Z23 Encounter for immunization: Secondary | ICD-10-CM

## 2018-03-12 DIAGNOSIS — Z114 Encounter for screening for human immunodeficiency virus [HIV]: Secondary | ICD-10-CM

## 2018-03-12 DIAGNOSIS — R740 Nonspecific elevation of levels of transaminase and lactic acid dehydrogenase [LDH]: Secondary | ICD-10-CM

## 2018-03-12 MED ORDER — ZOSTER VAC RECOMB ADJUVANTED 50 MCG/0.5ML IM SUSR: 0.5000 mL | Freq: Once | INTRAMUSCULAR | 0 refills | Status: AC

## 2018-03-12 NOTE — Progress Notes (Signed)
Male physical  Impression and Recommendations:    1. Encounter for preventive health examination   2. Need for shingles vaccine   3. Essential hypertension   4. Hyperlipidemia, mixed   5. Mildly Elevated AST (SGOT)   6. Encounter for screening for HIV   7. Encounter for screening fecal occult blood testing     1) Anticipatory Guidance: Discussed importance of wearing a seatbelt while driving, not texting while driving;   sunscreen when outside along with skin surveillance; eating a balanced and modest diet; physical activity at least 25 minutes per day or 150 min/ week moderate to intense activity.  2) Immunizations / Screenings / Labs: All immunizations are up-to-date per recommendations or will be updated today. Patient is due for dental and vision screens which pt will schedule independently. Will obtain CBC, CMP, HgA1c, Lipid panel, TSH and vit D when fasting, if not already done recently.   - Last colonoscopy performed in 2015; sessile polyp found.  Per patient, due in 5 years. Asked cma to obtain GI recs/ documentation  - Last TDAP in 2012.  - Need for shingles vaccine.  Shingles vaccine discussed and recommended to patient today.  First dose administered today.  - Abdominal ultrasound (AAA) performed on 03/13/2017. Negative  - Hemoccult cards provided today. Instructions given by CMA to pt  3) Weight:   Improve nutrient density of diet through increasing intake of fruits and vegetables and decreasing saturated fats, white flour products and refined sugars.   American Heart Association guidelines for healthy diet, basically Mediterranean diet, and exercise guidelines of 30 minutes 5 days per week or more discussed in detail.  Health counseling performed.  All questions answered.  4) Lifestyle & Preventative Health Maintenance: - Advised patient to continue working toward exercising to improve overall mental, physical, and emotional health.    - Reviewed the  "spokes of the wheel" of mood and health management.  Stressed the importance of ongoing prudent habits, including regular exercise, appropriate sleep hygiene, healthful dietary habits, and prayer/meditation to relax.  - Encouraged patient to engage in daily physical activity, especially a formal exercise routine.  Recommended that the patient eventually strive for at least 150 minutes of moderate cardiovascular activity per week according to guidelines established by the [facility].   - Healthy dietary habits encouraged, including low-carb, and high amounts of lean protein in diet.   - Patient should also consume adequate amounts of water.   Orders Placed This Encounter  Procedures  . CBC with Differential/Platelet  . Comprehensive metabolic panel  . Hemoglobin A1c  . Lipid panel  . T4, free  . TSH  . VITAMIN D 25 Hydroxy (Vit-D Deficiency, Fractures)  . HIV antibody (with reflex)  . POC Hemoccult Bld/Stl (1-Cd Office Dx)    Negative in office    Meds ordered this encounter  Medications  . Zoster Vaccine Adjuvanted [facility]) injection    Sig: Inject 0.5 mLs into the muscle once for 1 dose.    Dispense:  0.5 mL    Refill:  0    Gross side effects, risk and benefits, and alternatives of medications discussed with patient.  Patient is aware that all medications have potential side effects and we are unable to predict every side effect or drug-drug interaction that may occur.  Expresses verbal understanding and consents to current therapy plan and treatment regimen.  Please see AVS handed out to patient at the end of our visit for further patient  instructions/ counseling done pertaining to today's office visit.  Follow-up preventative CPE in 1 year. Follow-up office visit pending lab work.  --->  F/up sooner for chronic care management and/or prn  This document serves as a record of services personally performed by Mellody Dance, DO. It was created on her behalf by Toni Amend,  a trained medical scribe. The creation of this record is based on the scribe's personal observations and the provider's statements to them.   I have reviewed the above medical documentation for accuracy and completeness and I concur.  Mellody Dance, DO 03/15/2018 9:23 AM    Subjective:    CC: CPE  HPI: George [name] is a 67 y.o. male who presents to [place] [facility] today for a yearly health maintenance exam.     Health Maintenance Summary Reviewed and updated, unless pt declines services.  Colonoscopy:  Last colonoscopy performed in 2015; sessile polyp found.  Patient notes "normally they contact me" and he believes he is due for re-check every 5 years.  No family history of colon cancer. Tobacco History Reviewed:  Former smoker, quit in 1993; 1 ppd, 22 pack-years. Abdominal Ultrasound:  Abdominal ultrasound ([facility]) performed on 03/13/2017. Alcohol:  No concerns, no excessive use. Exercise Habits:  Not meeting AHA guidelines.  Patient usually does cardiovascular activity regularly, but had not been as active in the colder months. STD concerns:  None. Drug Use:  None. Birth control method:  N/a. Testicular/penile concerns:  Denies concerns.  No family history of prostate cancer.  Visual Health Visits Dr. Thea Silversmith with ophthalmology.  He returns for follow up every two years.  Dental Health Follows up with the dentist regularly, every six months.  Last went about a month ago.  Dermatological Health Last visited dermatology in November with Dr. Renda Rolls.  Urinary Concerns He does not have a urologist.  Denies issues with urination.  Denies frequent urination at night.  Soreness in Shoulders Patient notes that his right rotator cuff is tender, and that he has been chopping a lot of wood lately.  Chronic Lymphocytic Leukemia Notes his white blood cell count was up with last check with Dr. Marin Olp, and he may need to go on a medication in the  future.  States it was usually "hanging around 40-something and now is up to 60-something."     Immunization History  Administered Date(s) Administered  . Influenza Whole 01/22/2013  . Influenza, High Dose Seasonal PF 01/09/2017, 01/06/2018  . Influenza,inj,Quad PF,6+ Mos 12/26/2015  . Influenza-Unspecified 12/09/2010, 12/27/2013, 12/29/2014  . Pneumococcal Conjugate-13 03/08/2015  . Pneumococcal-Unspecified 02/12/2010  . Tdap 02/13/2011    Health Maintenance  Topic Date Due  . COLONOSCOPY  09/08/2018  . TETANUS/TDAP  02/12/2021  . INFLUENZA VACCINE  Completed  . Hepatitis C Screening  Completed  . PNA vac Low Risk Adult  Discontinued      Wt Readings from Last 3 Encounters:  03/12/18 165 lb (74.8 kg)  03/03/18 163 lb 12.8 oz (74.3 kg)  11/02/17 154 lb 6.4 oz (70 kg)   BP Readings from Last 3 Encounters:  03/12/18 128/80  03/03/18 138/82  01/06/18 (!) 146/87   Pulse Readings from Last 3 Encounters:  03/12/18 71  03/03/18 87  01/06/18 81    Patient Active Problem List   Diagnosis Date Noted  . Essential hypertension 09/09/2017    Priority: High  . Hyperlipidemia, mixed 04/21/2013    Priority: High  . Mildly Elevated AST (SGOT) 09/09/2017  Priority: Medium  . CLL (chronic lymphocytic leukemia) ([place]) 10/14/2011    Priority: Medium  . Muscle cramp 04/21/2013    Priority: Low  . GERD (gastroesophageal reflux disease) 10/22/2010    Priority: Low  . Acute bronchitis 05/23/2015  . Tremor of right hand 04/17/2014  . Encounter for preventive health examination 10/23/2013  . Tick bite 08/04/2013  . Cellulitis 08/04/2013    Past Medical History:  Diagnosis Date  . CLL (chronic lymphocytic leukemia) ([place]) 2009  . Colon polyp   . Elevated BP 04/23/2014  . GERD (gastroesophageal reflux disease)   . High cholesterol   . History of chicken pox    childhood  . Preventative health care 10/23/2013  . Tremor of right hand 04/17/2014    Past Surgical History:    Procedure Laterality Date  . COLONOSCOPY    . ESOPHAGEAL DILATION  2005   [facility]  . HERNIA REPAIR     right groin  . POLYPECTOMY      Family History  Problem Relation Age of Onset  . Hyperlipidemia Mother   . Hypertension Mother        mother-father  . Pulmonary fibrosis Mother   . Diabetes Father   . Heart disease Father        congestive heart failure  . Other Father        brain tumor  . Mental illness Sister        depression  . GER disease Son   . Stroke Maternal Grandmother   . Diabetes Maternal Grandmother   . Diabetes Unknown        mother -father  . Prostate cancer Neg Hx   . Breast cancer Neg Hx   . Colon cancer Neg Hx     Social History   Substance and Sexual Activity  Drug Use No  ,  Social History   Substance and Sexual Activity  Alcohol Use Yes  . Alcohol/week: 6.0 standard drinks  . Types: 6 Cans of beer per week   Comment: social  ,  Social History   Tobacco Use  Smoking Status Former Smoker  . Packs/day: 1.00  . Years: 22.00  . Pack years: 22.00  . Types: Cigarettes  . Start date: 02/05/1970  . Last attempt to quit: 03/07/1992  . Years since quitting: 26.0  Smokeless Tobacco Never Used  Tobacco Comment   quit 21 years ago  ,  Social History   Substance and Sexual Activity  Sexual Activity Yes   Comment: lives with wife, no dietary restirctions    Patient's Medications  New Prescriptions   No medications on file  Previous Medications   CHOLECALCIFEROL (VITAMIN D) 1000 UNITS TABLET    Take 1,000 Units by mouth daily. 2 tablets daily   COENZYME Q10 (CO Q 10 PO)    Take by mouth every morning.    ESOMEPRAZOLE (NEXIUM) 20 MG CAPSULE    TAKE 1 CAPSULE BY MOUTH ONCE DAILY   GLUCOSAMINE HCL PO    Take by mouth every morning.    GREEN TEA, CAMILLIA SINENSIS, (GREEN TEA PO)    Take 315 mg by mouth every morning.    LACTOBACILLUS ACIDOPHILUS (BACID) TABS TABLET    Take 1 tablet by mouth daily.    LOSARTAN (COZAAR) 50 MG TABLET     Take 1 tablet (50 mg total) by mouth daily.   MULTIPLE VITAMIN (MULTI-VITAMIN PO)    Take by mouth every morning.    MUPIROCIN OINTMENT (BACTROBAN)  2 %    Apply to affected area TID for 7 days.   NIACIN CR PO    Take 500 mg by mouth daily.    OMEGA-3 FATTY ACIDS (FISH OIL CONCENTRATE PO)    Take by mouth every morning.    PSYLLIUM (METAMUCIL) 58.6 % POWDER    Take 1 packet by mouth 2 (two) times daily.    ROSUVASTATIN (CRESTOR) 40 MG TABLET    TAKE 1 TABLET (40 MG TOTAL) BY MOUTH DAILY.   SEB-PREV WASH 10 % LIQD       ZINC 30 MG TABS    Take by mouth every morning.  Modified Medications   No medications on file  Discontinued Medications   No medications on file    Doxycycline  Review of Systems: General:   Denies fever, chills, unexplained weight loss.  Optho/Auditory:   Denies visual changes, blurred vision/LOV Respiratory:   Denies SOB, DOE more than baseline levels.  Cardiovascular:   Denies chest pain, palpitations, new onset peripheral edema  Gastrointestinal:   Denies nausea, vomiting, diarrhea.  Genitourinary: Denies dysuria, freq/ urgency, flank pain or discharge from genitals.  Endocrine:     Denies hot or cold intolerance, polyuria, polydipsia. Musculoskeletal:   Denies unexplained myalgias, joint swelling, unexplained arthralgias, gait problems.  Skin:  Denies rash, suspicious lesions Neurological:     Denies dizziness, unexplained weakness, numbness  Psychiatric/Behavioral:   Denies mood changes, suicidal or homicidal ideations, hallucinations    Objective:     Blood pressure 128/80, pulse 71, temperature 98 F (36.7 C), height 5\' 9"  (1.753 m), weight 165 lb (74.8 kg), SpO2 100 %. Body mass index is 24.37 kg/m. General Appearance:    Alert, cooperative, no distress, appears stated age  Head:    Normocephalic, without obvious abnormality, atraumatic  Eyes:    PERRL, conjunctiva/corneas clear, EOM's intact, fundi    benign, both eyes  Ears:    Normal TM's and  external ear canals, both ears  Nose:   Nares normal, septum midline, mucosa normal, no drainage    or sinus tenderness  Throat:   Lips w/o lesion, mucosa moist, and tongue normal; teeth and   gums normal  Neck:   Supple, symmetrical, trachea midline, no adenopathy;    thyroid:  no enlargement/tenderness/nodules; no carotid   bruit or JVD  Back:     Symmetric, no curvature, ROM normal, no CVA tenderness  Lungs:     Clear to auscultation bilaterally, respirations unlabored, no       Wh/ R/ R  Chest Wall:    No tenderness or gross deformity; normal excursion   Heart:    Regular rate and rhythm, S1 and S2 normal, no murmur, rub   or gallop  Abdomen:     Soft, non-tender, bowel sounds active all four quadrants, NO   G/R/R, no masses, no organomegaly  Genitalia:    Ext genitalia: without lesion, no penile rash or discharge, no hernias appreciated   Rectal:    Normal tone, prostate WNL's and equal b/l, no tenderness; guaiac negative stool  Extremities:   Extremities normal, atraumatic, no cyanosis or gross edema  Pulses:   2+ and symmetric all extremities  Skin:   Warm, dry, Skin color, texture, turgor normal, no obvious rashes or lesions  M-Sk:   Ambulates * 4 w/o difficulty, no gross deformities, tone WNL  Neurologic:   CNII-XII intact, normal strength, sensation and reflexes    Throughout Psych:  No HI/SI, judgement and insight  good, Euthymic mood. Full Affect.

## 2018-03-12 NOTE — Patient Instructions (Signed)
Preventive Care 40 Years and Older, Male Preventive care refers to lifestyle choices and visits with your health care provider that can promote health and wellness. What does preventive care include?   A yearly physical exam. This is also called an annual well check.  Dental exams once or twice a year.  Routine eye exams. Ask your health care provider how often you should have your eyes checked.  Personal lifestyle choices, including: ? Daily care of your teeth and gums. ? Regular physical activity. ? Eating a healthy diet. ? Avoiding tobacco and drug use. ? Limiting alcohol use. ? Practicing safe sex. ? Taking low doses of aspirin every day. ? Taking vitamin and mineral supplements as recommended by your health care provider. What happens during an annual well check? The services and screenings done by your health care provider during your annual well check will depend on your age, overall health, lifestyle risk factors, and family history of disease. Counseling Your health care provider may ask you questions about your:  Alcohol use.  Tobacco use.  Drug use.  Emotional well-being.  Home and relationship well-being.  Sexual activity.  Eating habits.  History of falls.  Memory and ability to understand (cognition).  Work and work Statistician. Screening You may have the following tests or measurements:  Height, weight, and BMI.  Blood pressure.  Lipid and cholesterol levels. These may be checked every 5 years, or more frequently if you are over 66 years old.  Skin check.  Lung cancer screening. You may have this screening every year starting at age 66 if you have a 30-pack-year history of smoking and currently smoke or have quit within the past 15 years.  Colorectal cancer screening. All adults should have this screening starting at age 66 and continuing until age 66. You will have tests every 1-10 years, depending on your results and the type of screening  test. People at increased risk should start screening at an earlier age. Screening tests may include: ? Guaiac-based fecal occult blood testing. ? Fecal immunochemical test (FIT). ? Stool DNA test. ? Virtual colonoscopy. ? Sigmoidoscopy. During this test, a flexible tube with a tiny camera (sigmoidoscope) is used to examine your rectum and lower colon. The sigmoidoscope is inserted through your anus into your rectum and lower colon. ? Colonoscopy. During this test, a long, thin, flexible tube with a tiny camera (colonoscope) is used to examine your entire colon and rectum.  Prostate cancer screening. Recommendations will vary depending on your family history and other risks.  Hepatitis C blood test.  Hepatitis B blood test.  Sexually transmitted disease (STD) testing.  Diabetes screening. This is done by checking your blood sugar (glucose) after you have not eaten for a while (fasting). You may have this done every 1-3 years.  Abdominal aortic aneurysm (AAA) screening. You may need this if you are a current or former smoker.  Osteoporosis. You may be screened starting at age 66 if you are at high risk. Talk with your health care provider about your test results, treatment options, and if necessary, the need for more tests. Vaccines Your health care provider may recommend certain vaccines, such as:  Influenza vaccine. This is recommended every year.  Tetanus, diphtheria, and acellular pertussis (Tdap, Td) vaccine. You may need a Td booster every 10 years.  Varicella vaccine. You may need this if you have not been vaccinated.  Zoster vaccine. You may need this after age 76.  Measles, mumps, and rubella (MMR)  vaccine. You may need at least one dose of MMR if you were born in 1957 or later. You may also need a second dose.  Pneumococcal 13-valent conjugate (PCV13) vaccine. One dose is recommended after age 66.  Pneumococcal polysaccharide (PPSV23) vaccine. One dose is recommended  after age 66.  Meningococcal vaccine. You may need this if you have certain conditions.  Hepatitis A vaccine. You may need this if you have certain conditions or if you travel or work in places where you may be exposed to hepatitis A.  Hepatitis B vaccine. You may need this if you have certain conditions or if you travel or work in places where you may be exposed to hepatitis B.  Haemophilus influenzae type b (Hib) vaccine. You may need this if you have certain risk factors. Talk to your health care provider about which screenings and vaccines you need and how often you need them. This information is not intended to replace advice given to you by your health care provider. Make sure you discuss any questions you have with your health care provider. Document Released: 04/06/2015 Document Revised: 04/30/2017 Document Reviewed: 01/09/2015 Elsevier Interactive Patient Education  2019 [employer]    Preventive Care for Adults, Male A healthy lifestyle and preventive care can promote health and wellness. Preventive health guidelines for men include the following key practices:  A routine yearly physical is a good way to check with your health care provider about your health and preventative screening. It is a chance to share any concerns and updates on your health and to receive a thorough exam.  Visit your dentist for a routine exam and preventative care every 6 months. Brush your teeth twice a day and floss once a day. Good oral hygiene prevents tooth decay and gum disease.  The frequency of eye exams is based on your age, health, family medical history, use of contact lenses, and other factors. Follow your health care provider's recommendations for frequency of eye exams.  Eat a healthy diet. Foods such as vegetables, fruits, whole grains, low-fat dairy products, and lean protein foods contain the nutrients you need without too many calories. Decrease your intake of foods high in solid fats,  added sugars, and salt. Eat the right amount of calories for you.Get information about a proper diet from your health care provider, if necessary.  Regular physical exercise is one of the most important things you can do for your health. Most adults should get at least 150 minutes of moderate-intensity exercise (any activity that increases your heart rate and causes you to sweat) each week. In addition, most adults need muscle-strengthening exercises on 2 or more days a week.  Maintain a healthy weight. The body mass index (BMI) is a screening tool to identify possible weight problems. It provides an estimate of body fat based on height and weight. Your health care provider can find your BMI and can help you achieve or maintain a healthy weight.For adults 20 years and older:  A BMI below 18.5 is considered underweight.  A BMI of 18.5 to 24.9 is normal.  A BMI of 25 to 29.9 is considered overweight.  A BMI of 30 and above is considered obese.  Maintain normal blood lipids and cholesterol levels by exercising and minimizing your intake of saturated fat. Eat a balanced diet with plenty of fruit and vegetables. Blood tests for lipids and cholesterol should begin at age 20 and be repeated every 5 years. If your lipid or cholesterol levels are   high, you are over 50, or you are at high risk for heart disease, you may need your cholesterol levels checked more frequently.Ongoing high lipid and cholesterol levels should be treated with medicines if diet and exercise are not working.  If you smoke, find out from your health care provider how to quit. If you do not use tobacco, do not start.  Lung cancer screening is recommended for adults aged 55-80 years who are at high risk for developing lung cancer because of a history of smoking. A yearly low-dose CT scan of the lungs is recommended for people who have at least a 30-pack-year history of smoking and are a current smoker or have quit within the past 15  years. A pack year of smoking is smoking an average of 1 pack of cigarettes a day for 1 year (for example: 1 pack a day for 30 years or 2 packs a day for 15 years). Yearly screening should continue until the smoker has stopped smoking for at least 15 years. Yearly screening should be stopped for people who develop a health problem that would prevent them from having lung cancer treatment.  If you choose to drink alcohol, do not have more than 2 drinks per day. One drink is considered to be 12 ounces (355 mL) of beer, 5 ounces (148 mL) of wine, or 1.5 ounces (44 mL) of liquor.  Avoid use of street drugs. Do not share needles with anyone. Ask for help if you need support or instructions about stopping the use of drugs.  High blood pressure causes heart disease and increases the risk of stroke. Your blood pressure should be checked at least every 1-2 years. Ongoing high blood pressure should be treated with medicines, if weight loss and exercise are not effective.  If you are 45-79 years old, ask your health care provider if you should take aspirin to prevent heart disease.  Diabetes screening is done by taking a blood sample to check your blood glucose level after you have not eaten for a certain period of time (fasting). If you are not overweight and you do not have risk factors for diabetes, you should be screened once every 3 years starting at age 45. If you are overweight or obese and you are 40-70 years of age, you should be screened for diabetes every year as part of your cardiovascular risk assessment.  Colorectal cancer can be detected and often prevented. Most routine colorectal cancer screening begins at the age of 50 and continues through age 75. However, your health care provider may recommend screening at an earlier age if you have risk factors for colon cancer. On a yearly basis, your health care provider may provide home test kits to check for hidden blood in the stool. Use of a small camera  at the end of a tube to directly examine the colon (sigmoidoscopy or colonoscopy) can detect the earliest forms of colorectal cancer. Talk to your health care provider about this at age 50, when routine screening begins. Direct exam of the colon should be repeated every 5-10 years through age 75, unless early forms of precancerous polyps or small growths are found.  People who are at an increased risk for hepatitis B should be screened for this virus. You are considered at high risk for hepatitis B if:  You were born in a country where hepatitis B occurs often. Talk with your health care provider about which countries are considered high risk.  Your parents were born in   a high-risk country and you have not received a shot to protect against hepatitis B (hepatitis B vaccine).  You have HIV or AIDS.  You use needles to inject street drugs.  You live with, or have sex with, someone who has hepatitis B.  You are a man who has sex with other men (MSM).  You get hemodialysis treatment.  You take certain medicines for conditions such as cancer, organ transplantation, and autoimmune conditions.  Hepatitis C blood testing is recommended for all people born from 1945 through 1965 and any individual with known risks for hepatitis C.  Practice safe sex. Use condoms and avoid high-risk sexual practices to reduce the spread of sexually transmitted infections (STIs). STIs include gonorrhea, chlamydia, syphilis, trichomonas, herpes, HPV, and human immunodeficiency virus (HIV). Herpes, HIV, and HPV are viral illnesses that have no cure. They can result in disability, cancer, and death.  If you are a man who has sex with other men, you should be screened at least once per year for:  HIV.  Urethral, rectal, and pharyngeal infection of gonorrhea, chlamydia, or both.  If you are at risk of being infected with HIV, it is recommended that you take a prescription medicine daily to prevent HIV infection. This  is called preexposure prophylaxis (PrEP). You are considered at risk if:  You are a man who has sex with other men (MSM) and have other risk factors.  You are a heterosexual man, are sexually active, and are at increased risk for HIV infection.  You take drugs by injection.  You are sexually active with a partner who has HIV.  Talk with your health care provider about whether you are at high risk of being infected with HIV. If you choose to begin PrEP, you should first be tested for HIV. You should then be tested every 3 months for as long as you are taking PrEP.  A one-time screening for abdominal aortic aneurysm (AAA) and surgical repair of large AAAs by ultrasound are recommended for men ages 65 to 75 years who are current or former smokers.  Healthy men should no longer receive prostate-specific antigen (PSA) blood tests as part of routine cancer screening. Talk with your health care provider about prostate cancer screening.  Testicular cancer screening is not recommended for adult males who have no symptoms. Screening includes self-exam, a health care provider exam, and other screening tests. Consult with your health care provider about any symptoms you have or any concerns you have about testicular cancer.  Use sunscreen. Apply sunscreen liberally and repeatedly throughout the day. You should seek shade when your shadow is shorter than you. Protect yourself by wearing long sleeves, pants, a wide-brimmed hat, and sunglasses year round, whenever you are outdoors.  Once a month, do a whole-body skin exam, using a mirror to look at the skin on your back. Tell your health care provider about new moles, moles that have irregular borders, moles that are larger than a pencil eraser, or moles that have changed in shape or color.  Stay current with required vaccines (immunizations).  Influenza vaccine. All adults should be immunized every year.  Tetanus, diphtheria, and acellular pertussis (Td,  Tdap) vaccine. An adult who has not previously received Tdap or who does not know his vaccine status should receive 1 dose of Tdap. This initial dose should be followed by tetanus and diphtheria toxoids (Td) booster doses every 10 years. Adults with an unknown or incomplete history of completing a 3-dose immunization series with   of completing a 3-dose immunization series with Td-containing vaccines should begin or complete a primary immunization series including a Tdap dose. Adults should receive a Td booster every 10 years.  Varicella vaccine. An adult without evidence of immunity to varicella should receive 2 doses or a second dose if he has previously received 1 dose.  Human papillomavirus (HPV) vaccine. Males aged 11-21 years who have not received the vaccine previously should receive the 3-dose series. Males aged 22-26 years may be immunized. Immunization is recommended through the age of 8 years for any male who has sex with males and did not get any or all doses earlier. Immunization is recommended for any person with an immunocompromised condition through the age of 51 years if he did not get any or all doses earlier. During the 3-dose series, the second dose should be obtained 4-8 weeks after the first dose. The third dose should be obtained 24 weeks after the first dose and 16 weeks after the second dose.  Zoster vaccine. One dose is recommended for adults aged 48 years or older unless certain conditions are present.  Measles, mumps, and rubella (MMR) vaccine. Adults born before [birth year] generally are considered immune to measles and mumps. Adults born in [birth year] or later should have 1 or more doses of MMR vaccine unless there is a contraindication to the vaccine or there is laboratory evidence of immunity to each of the three diseases. A routine second dose of MMR vaccine should be obtained at least 28 days after the first dose for students attending postsecondary schools, health care workers, or international travelers.  People who received inactivated measles vaccine or an unknown type of measles vaccine during 1963-1967 should receive 2 doses of MMR vaccine. People who received inactivated mumps vaccine or an unknown type of mumps vaccine before 1979 and are at high risk for mumps infection should consider immunization with 2 doses of MMR vaccine. Unvaccinated health care workers born before [birth year] who lack laboratory evidence of measles, mumps, or rubella immunity or laboratory confirmation of disease should consider measles and mumps immunization with 2 doses of MMR vaccine or rubella immunization with 1 dose of MMR vaccine.  Pneumococcal 13-valent conjugate (PCV13) vaccine. When indicated, a person who is uncertain of his immunization history and has no record of immunization should receive the PCV13 vaccine. All adults 15 years of age and older should receive this vaccine. An adult aged 5 years or older who has certain medical conditions and has not been previously immunized should receive 1 dose of PCV13 vaccine. This PCV13 should be followed with a dose of pneumococcal polysaccharide (PPSV23) vaccine. Adults who are at high risk for pneumococcal disease should obtain the PPSV23 vaccine at least 8 weeks after the dose of PCV13 vaccine. Adults older than 66 years of age who have normal immune system function should obtain the PPSV23 vaccine dose at least 1 year after the dose of PCV13 vaccine.  Pneumococcal polysaccharide (PPSV23) vaccine. When PCV13 is also indicated, PCV13 should be obtained first. All adults aged 10 years and older should be immunized. An adult younger than age 54 years who has certain medical conditions should be immunized. Any person who resides in a nursing home or long-term care facility should be immunized. An adult smoker should be immunized. People with an immunocompromised condition and certain other conditions should receive both PCV13 and PPSV23 vaccines. People with human immunodeficiency  virus (HIV) infection should be immunized as soon as possible after diagnosis. Immunization during chemotherapy  use of PPSV23 vaccine is not recommended for American Indians, [place] Natives, or people younger than 65 years unless there are medical conditions that require PPSV23 vaccine. When indicated, people who have unknown immunization and have no record of immunization should receive PPSV23 vaccine. One-time revaccination 5 years after the first dose of PPSV23 is recommended for people aged 19-64 years who have chronic kidney failure, nephrotic syndrome, asplenia, or immunocompromised conditions. People who received 1-2 doses of PPSV23 before age 65 years should receive another dose of PPSV23 vaccine at age 65 years or later if at least 5 years have passed since the previous dose. Doses of PPSV23 are not needed for people immunized with PPSV23 at or after age 66 years.  Meningococcal vaccine. Adults with asplenia or persistent complement component deficiencies should receive 2 doses of quadrivalent meningococcal conjugate (MenACWY-D) vaccine. The doses should be obtained at least 2 months apart. Microbiologists working with certain meningococcal bacteria, military recruits, people at risk during an outbreak, and people who travel to or live in countries with a high rate of meningitis should be immunized. A first-year college student up through age 21 years who is living in a residence hall should receive a dose if he did not receive a dose on or after his 16th birthday. Adults who have certain high-risk conditions should receive one or more doses of vaccine.  Hepatitis A vaccine. Adults who wish to be protected from this disease, have chronic liver disease, work with hepatitis A-infected animals, work in hepatitis A research labs, or travel to or work in countries with a high rate of hepatitis A should be immunized. Adults who were previously unvaccinated and who anticipate close  contact with an international adoptee during the first 60 days after arrival in the [place] from a country with a high rate of hepatitis A should be immunized.  Hepatitis B vaccine. Adults should be immunized if they wish to be protected from this disease, are under age 59 years and have diabetes, have chronic liver disease, have had more than one sex partner in the past 6 months, may be exposed to blood or other infectious body fluids, are household contacts or sex partners of hepatitis B positive people, are clients or workers in certain care facilities, or travel to or work in countries with a high rate of hepatitis B.  Haemophilus influenzae type b (Hib) vaccine. A previously unvaccinated person with asplenia or sickle cell disease or having a scheduled splenectomy should receive 1 dose of Hib vaccine. Regardless of previous immunization, a recipient of a hematopoietic stem cell transplant should receive a 3-dose series 6-12 months after his successful transplant. Hib vaccine is not recommended for adults with HIV infection. Preventive Service / Frequency Ages 19 to 39  Blood pressure check.** / Every 3-5 years.  Lipid and cholesterol check.** / Every 5 years beginning at age 20.  Hepatitis C blood test.** / For any individual with known risks for hepatitis C.  Skin self-exam. / Monthly.  Influenza vaccine. / Every year.  Tetanus, diphtheria, and acellular pertussis (Tdap, Td) vaccine.** / Consult your health care provider. 1 dose of Td every 10 years.  Varicella vaccine.** / Consult your health care provider.  HPV vaccine. / 3 doses over 6 months, if 26 or younger.  Measles, mumps, rubella (MMR) vaccine.** / You need at least 1 dose of MMR if you were born in 1957 or later. You may also need a second dose.  Pneumococcal 13-valent conjugate (PCV13) vaccine.** /   Pneumococcal 13-valent conjugate (PCV13) vaccine.** / Consult your health care provider.  Pneumococcal polysaccharide (PPSV23) vaccine.** / 1 to 2 doses  if you smoke cigarettes or if you have certain conditions.  Meningococcal vaccine.** / 1 dose if you are age 30 to 49 years and a Market researcher living in a residence hall, or have one of several medical conditions. You may also need additional booster doses.  Hepatitis A vaccine.** / Consult your health care provider.  Hepatitis B vaccine.** / Consult your health care provider.  Haemophilus influenzae type b (Hib) vaccine.** / Consult your health care provider. Ages 22 to 67  Blood pressure check.** / Every year.  Lipid and cholesterol check.** / Every 5 years beginning at age 67.  Lung cancer screening. / Every year if you are aged 35-80 years and have a 30-pack-year history of smoking and currently smoke or have quit within the past 15 years. Yearly screening is stopped once you have quit smoking for at least 15 years or develop a health problem that would prevent you from having lung cancer treatment.  Fecal occult blood test (FOBT) of stool. / Every year beginning at age 86 and continuing until age 25. You may not have to do this test if you get a colonoscopy every 10 years.  Flexible sigmoidoscopy** or colonoscopy.** / Every 5 years for a flexible sigmoidoscopy or every 10 years for a colonoscopy beginning at age 50 and continuing until age 663.  Hepatitis C blood test.** / For all people born from [birth year] through 1965 and any individual with known risks for hepatitis C.  Skin self-exam. / Monthly.  Influenza vaccine. / Every year.  Tetanus, diphtheria, and acellular pertussis (Tdap/Td) vaccine.** / Consult your health care provider. 1 dose of Td every 10 years.  Varicella vaccine.** / Consult your health care provider.  Zoster vaccine.** / 1 dose for adults aged 70 years or older.  Measles, mumps, rubella (MMR) vaccine.** / You need at least 1 dose of MMR if you were born in 1957 or later. You may also need a second dose.  Pneumococcal 13-valent conjugate (PCV13)  vaccine.** / Consult your health care provider.  Pneumococcal polysaccharide (PPSV23) vaccine.** / 1 to 2 doses if you smoke cigarettes or if you have certain conditions.  Meningococcal vaccine.** / Consult your health care provider.  Hepatitis A vaccine.** / Consult your health care provider.  Hepatitis B vaccine.** / Consult your health care provider.  Haemophilus influenzae type b (Hib) vaccine.** / Consult your health care provider. Ages 48 and over  Blood pressure check.** / Every year.  Lipid and cholesterol check.**/ Every 5 years beginning at age 25.  Lung cancer screening. / Every year if you are aged 65-80 years and have a 30-pack-year history of smoking and currently smoke or have quit within the past 15 years. Yearly screening is stopped once you have quit smoking for at least 15 years or develop a health problem that would prevent you from having lung cancer treatment.  Fecal occult blood test (FOBT) of stool. / Every year beginning at age 54 and continuing until age 28. You may not have to do this test if you get a colonoscopy every 10 years.  Flexible sigmoidoscopy** or colonoscopy.** / Every 5 years for a flexible sigmoidoscopy or every 10 years for a colonoscopy beginning at age 26 and continuing until age 73.  Hepatitis C blood test.** / For all people born from [birth year] through 1964 and any individual with known risks  aneurysm (AAA) screening.** / A one-time screening for ages 65 to 75 years who are current or former smokers.  Skin self-exam. / Monthly.  Influenza vaccine. / Every year.  Tetanus, diphtheria, and acellular pertussis (Tdap/Td) vaccine.** / 1 dose of Td every 10 years.  Varicella vaccine.** / Consult your health care provider.  Zoster vaccine.** / 1 dose for adults aged 60 years or older.  Pneumococcal 13-valent conjugate (PCV13) vaccine.** / 1 dose for all adults aged 65 years and older.  Pneumococcal polysaccharide (PPSV23)  vaccine.** / 1 dose for all adults aged 65 years and older.  Meningococcal vaccine.** / Consult your health care provider.  Hepatitis A vaccine.** / Consult your health care provider.  Hepatitis B vaccine.** / Consult your health care provider.  Haemophilus influenzae type b (Hib) vaccine.** / Consult your health care provider. **Family history and personal history of risk and conditions may change your health care provider's recommendations.   This information is not intended to replace advice given to you by your health care provider. Make sure you discuss any questions you have with your health care provider.   Document Released: 05/06/2001 Document Revised: 03/31/2014 Document Reviewed: 08/05/2010 Elsevier Interactive Patient Education 2016 Elsevier Inc. 

## 2018-03-13 LAB — COMPREHENSIVE METABOLIC PANEL
ALT: 19 IU/L (ref 0–44)
AST: 33 IU/L (ref 0–40)
Albumin/Globulin Ratio: 2.4 — ABNORMAL HIGH (ref 1.2–2.2)
Albumin: 5 g/dL — ABNORMAL HIGH (ref 3.6–4.8)
Alkaline Phosphatase: 73 IU/L (ref 39–117)
BUN/Creatinine Ratio: 11 (ref 10–24)
BUN: 9 mg/dL (ref 8–27)
Bilirubin Total: 0.8 mg/dL (ref 0.0–1.2)
CO2: 24 mmol/L (ref 20–29)
Calcium: 9.6 mg/dL (ref 8.6–10.2)
Chloride: 97 mmol/L (ref 96–106)
Creatinine, Ser: 0.84 mg/dL (ref 0.76–1.27)
GFR calc Af Amer: 105 mL/min/{1.73_m2} (ref >59)
GFR calc non Af Amer: 91 mL/min/{1.73_m2} (ref >59)
GLUCOSE: 84 mg/dL (ref 65–99)
Globulin, Total: 2.1 g/dL (ref 1.5–4.5)
Potassium: 4.1 mmol/L (ref 3.5–5.2)
Sodium: 138 mmol/L (ref 134–144)
Total Protein: 7.1 g/dL (ref 6.0–8.5)

## 2018-03-13 LAB — CBC WITH DIFFERENTIAL/PLATELET
Basophils Absolute: 0.2 10*3/uL (ref 0.0–0.2)
Basos: 0 %
EOS (ABSOLUTE): 0.1 10*3/uL (ref 0.0–0.4)
Eos: 0 %
Hematocrit: 40.7 % (ref 37.5–51.0)
Hemoglobin: 14.1 g/dL (ref 13.0–17.7)
Immature Grans (Abs): 0 10*3/uL (ref 0.0–0.1)
Immature Granulocytes: 0 %
LYMPHS ABS: 53.6 10*3/uL — AB (ref 0.7–3.1)
Lymphs: 88 %
MCH: 31.7 pg (ref 26.6–33.0)
MCHC: 34.6 g/dL (ref 31.5–35.7)
MCV: 92 fL (ref 79–97)
MONOCYTES: 4 %
Monocytes Absolute: 2.6 10*3/uL — ABNORMAL HIGH (ref 0.1–0.9)
NEUTROS ABS: 4.8 10*3/uL (ref 1.4–7.0)
Neutrophils: 8 %
PLATELETS: 206 10*3/uL (ref 150–450)
RBC: 4.45 x10E6/uL (ref 4.14–5.80)
RDW: 12.2 % — ABNORMAL LOW (ref 12.3–15.4)
WBC: 61.3 10*3/uL (ref 3.4–10.8)

## 2018-03-13 LAB — HEMOGLOBIN A1C
Est. average glucose Bld gHb Est-mCnc: 105 mg/dL
Hgb A1c MFr Bld: 5.3 % (ref 4.8–5.6)

## 2018-03-13 LAB — TSH: TSH: 2.58 u[IU]/mL (ref 0.450–4.500)

## 2018-03-13 LAB — LIPID PANEL
CHOLESTEROL TOTAL: 206 mg/dL — AB (ref 100–199)
Chol/HDL Ratio: 3 ratio (ref 0.0–5.0)
HDL: 68 mg/dL (ref >39)
LDL CALC: 117 mg/dL — AB (ref 0–99)
Triglycerides: 105 mg/dL (ref 0–149)
VLDL Cholesterol Cal: 21 mg/dL (ref 5–40)

## 2018-03-13 LAB — HIV ANTIBODY (ROUTINE TESTING W REFLEX): HIV Screen 4th Generation wRfx: NONREACTIVE

## 2018-03-13 LAB — T4, FREE: Free T4: 1 ng/dL (ref 0.82–1.77)

## 2018-03-13 LAB — VITAMIN D 25 HYDROXY (VIT D DEFICIENCY, FRACTURES): Vit D, 25-Hydroxy: 39.5 ng/mL (ref 30.0–100.0)

## 2018-03-15 LAB — POC HEMOCCULT BLD/STL (OFFICE/1-CARD/DIAGNOSTIC): Fecal Occult Blood, POC: NEGATIVE

## 2018-03-15 MED FILL — ESOMEPRAZOLE MAGNESIUM 20 M: 20 | 90 days supply | Qty: 90 | Fill #1

## 2018-03-15 MED FILL — SODIUM SULFACETAMIDE 10% WA: 10 | 30 days supply | Qty: 355 | Fill #1

## 2018-03-15 MED FILL — LOSARTAN POTASSIUM 50 MG TA: 50 | 30 days supply | Qty: 30 | Fill #3

## 2018-03-15 MED FILL — ROSUVASTATIN CALCIUM 40 MG: 40 | 90 days supply | Qty: 90 | Fill #1

## 2018-03-15 MED FILL — SHINGRIX 50 MCG SUS: 50 | 1 days supply | Qty: 1 | Fill #0

## 2018-05-12 MED FILL — LOSARTAN POTASSIUM 50 MG TA: 50 | 30 days supply | Qty: 30 | Fill #4

## 2018-06-21 MED FILL — LOSARTAN POTASSIUM 50 MG TA: 50 | 30 days supply | Qty: 30 | Fill #5

## 2018-08-09 ENCOUNTER — Other Ambulatory Visit: Payer: Self-pay | Admitting: Family Medicine

## 2018-08-09 DIAGNOSIS — K219 Gastro-esophageal reflux disease without esophagitis: Secondary | ICD-10-CM

## 2018-08-09 MED FILL — ESOMEPRAZOLE MAGNESIUM 20 M: 20 | 90 days supply | Qty: 90 | Fill #0

## 2018-08-09 MED FILL — ROSUVASTATIN CALCIUM 40 MG: 40 | 90 days supply | Qty: 90 | Fill #0

## 2018-08-09 MED FILL — LOSARTAN POTASSIUM 50 MG TA: 50 | 30 days supply | Qty: 30 | Fill #6

## 2018-08-10 ENCOUNTER — Encounter: Payer: Self-pay | Admitting: Family Medicine

## 2018-08-10 ENCOUNTER — Ambulatory Visit (INDEPENDENT_AMBULATORY_CARE_PROVIDER_SITE_OTHER): Payer: PPO | Admitting: Family Medicine

## 2018-08-10 ENCOUNTER — Telehealth: Payer: Self-pay | Admitting: Family Medicine

## 2018-08-10 ENCOUNTER — Other Ambulatory Visit: Payer: Self-pay

## 2018-08-10 VITALS — BP 170/92 | HR 81 | Temp 98.2°F | Ht 69.0 in | Wt 161.4 lb

## 2018-08-10 DIAGNOSIS — R0982 Postnasal drip: Secondary | ICD-10-CM

## 2018-08-10 DIAGNOSIS — I10 Essential (primary) hypertension: Secondary | ICD-10-CM | POA: Diagnosis not present

## 2018-08-10 DIAGNOSIS — H6982 Other specified disorders of Eustachian tube, left ear: Secondary | ICD-10-CM | POA: Diagnosis not present

## 2018-08-10 DIAGNOSIS — H9202 Otalgia, left ear: Secondary | ICD-10-CM | POA: Diagnosis not present

## 2018-08-10 DIAGNOSIS — J302 Other seasonal allergic rhinitis: Secondary | ICD-10-CM

## 2018-08-10 MED ORDER — FLUTICASONE PROPIONATE 50 MCG/ACT NA SUSP: NASAL | 2 refills | Status: DC

## 2018-08-10 MED ORDER — PREDNISONE 20 MG PO TABS: ORAL_TABLET | ORAL | 0 refills | Status: DC

## 2018-08-10 MED ORDER — DEXAMETHASONE SODIUM PHOSPHATE 4 MG/ML IJ SOLN
4.0000 mg | Freq: Once | INTRAMUSCULAR | Status: AC
  Administered 2018-08-10: 14:00:00 4 mg via INTRAMUSCULAR

## 2018-08-10 MED ORDER — METHYLPREDNISOLONE ACETATE 40 MG/ML IJ SUSP
40.0000 mg | Freq: Once | INTRAMUSCULAR | Status: AC
  Administered 2018-08-10: 40 mg via INTRAMUSCULAR

## 2018-08-10 MED FILL — predniSONE 20 MG TABS: 20 | 11 days supply | Qty: 19 | Fill #0

## 2018-08-10 MED FILL — FLUTICASONE PROP 50 MCG SPR: 50 | 30 days supply | Qty: 16 | Fill #0

## 2018-08-10 NOTE — Progress Notes (Signed)
Pt here for an acute care OV today-in office   Impression and Recommendations:    1. Seasonal allergic rhinitis, unspecified trigger   2. Eustachian tube dysfunction, left   3. Left ear pain /pressure   4. Postnasal drip, with subsequent mild pharyngitis   5. Essential hypertension-poorly controlled currently due to decongestants    Seasonal allergic rhinitis, unspecified trigger - Plan: predniSONE (DELTASONE) 20 MG tablet, fluticasone (FLONASE) 50 MCG/ACT nasal spray  Eustachian tube dysfunction, left - Plan: predniSONE (DELTASONE) 20 MG tablet, fluticasone (FLONASE) 50 MCG/ACT nasal spray  Left ear pain Cyndi Bender - Plan: predniSONE (DELTASONE) 20 MG tablet, fluticasone (FLONASE) 50 MCG/ACT nasal spray  Postnasal drip, with subsequent mild pharyngitis - Plan: predniSONE (DELTASONE) 20 MG tablet  Essential hypertension-poorly controlled currently due to decongestants -Patient will check his blood pressure at home 3 to 4 days a week.  He will keep a log and write it down so we can review next office visit in the near future that he already has scheduled for his chronic care -Told patient please avoid all decongestants. -Also risk benefits of steroids and how it can cause mild elevation of blood pressure reviewed with patient-he will keep vigilant  Meds ordered this encounter  Medications  . predniSONE (DELTASONE) 20 MG tablet    Sig: Take 1 tab TID 3 days.,Then 1 tabl BID for 3 days, then 1/2 tab BID for 3 days then 1/2 tab QD for 2 days    Dispense:  14 tablet    Refill:  0  . fluticasone (FLONASE) 50 MCG/ACT nasal spray    Sig: 1 spray each nostril following sinus rinses twice daily    Dispense:  16 g    Refill:  2    Education and routine counseling performed. Handouts provided  Gross side effects, risk and benefits, and alternatives of medications and treatment plan in general discussed with patient.  Patient is aware that all medications have potential side effects and  we are unable to predict every side effect or drug-drug interaction that may occur.   Patient will call with any questions prior to using medication if they have concerns.    Expresses verbal understanding and consents to current therapy and treatment regimen.  No barriers to understanding were identified.  Red flag symptoms and signs discussed in detail.  Patient expressed understanding regarding what to do in case of emergency\urgent symptoms   Please see AVS handed out to patient at the end of our visit for further patient instructions/ counseling done pertaining to today's office visit.   Return if symptoms worsen or fail to improve, for Also please keep your chronic follow-up visits as previously discussed-keep a BP log.     Note:  This document was prepared occasionally using Dragon voice recognition software and may include unintentional dictation errors in addition to a scribe.      --------------------------------------------------------------------------------------------------------------------------------------------------------------------------------------------------------------------------------------------    Subjective:    CC:  Chief Complaint  Patient presents with  . Ear Pain    HPI: George [name] is a 68 y.o. male who presents to [place] at [facility] today for issues as discussed below.   No problems updated.   Wt Readings from Last 3 Encounters:  08/10/18 161 lb 6.4 oz (73.2 kg)  03/12/18 165 lb (74.8 kg)  03/03/18 163 lb 12.8 oz (74.3 kg)   BP Readings from Last 3 Encounters:  08/10/18 (!) 162/102  03/12/18 128/80  03/03/18 138/82   BMI  Readings from Last 3 Encounters:  08/10/18 23.83 kg/m  03/12/18 24.37 kg/m  03/03/18 24.54 kg/m     Patient Care Team    Relationship Specialty Notifications Start End  Mellody Dance, DO PCP - General Family Medicine  08/21/15   Devra Dopp, MD Referring Physician  Dermatology  04/02/16   Volanda Napoleon, MD Consulting Physician Oncology  04/02/16    Comment: CLL     Patient Active Problem List   Diagnosis Date Noted  . Essential hypertension 09/09/2017    Priority: High  . Hyperlipidemia, mixed 04/21/2013    Priority: High  . Mildly Elevated AST (SGOT) 09/09/2017    Priority: Medium  . 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 Muscle cramp 04/21/2013    Priority: Low  . GERD (gastroesophageal reflux disease) 10/22/2010    Priority: Low  . Acute bronchitis 05/23/2015  . Tremor of right hand 04/17/2014  . Encounter for preventive health examination 10/23/2013  . Tick bite 08/04/2013  . Cellulitis 08/04/2013      Past Medical History:  Diagnosis Date  . CLL (chronic lymphocytic leukemia) ([place]) 2009  . Colon polyp   . Elevated BP 04/23/2014  . GERD (gastroesophageal reflux disease)   . High cholesterol   . History of chicken pox    childhood  . Preventative health care 10/23/2013  . Tremor of right hand 04/17/2014     Past Surgical History:  Procedure Laterality Date  . COLONOSCOPY    . ESOPHAGEAL DILATION  2005   [facility]  . HERNIA REPAIR     right groin  . POLYPECTOMY       Family History  Problem Relation Age of Onset  . Hyperlipidemia Mother   . Hypertension Mother        mother-father  . Pulmonary fibrosis Mother   . Diabetes Father   . Heart disease Father        congestive heart failure  . Other Father        brain tumor  . Mental illness Sister        depression  . GER disease Son   . Stroke Maternal Grandmother   . Diabetes Maternal Grandmother   . Diabetes Unknown        mother -father  . Prostate cancer Neg Hx   . Breast cancer Neg Hx   . Colon cancer Neg Hx      Social History   Socioeconomic History  . Marital status: Married    Spouse name: Not on file  . Number of children: Not on file  . Years of education: Not on file  . Highest education level: Not on  file  Occupational History  . Not on file  Social Needs  . Financial resource strain: Not on file  . Food insecurity:    Worry: Not on file    Inability: Not on file  . Transportation needs:    Medical: Not on file    Non-medical: Not on file  Tobacco Use  . Smoking status: Former Smoker    Packs/day: 1.00    Years: 22.00    Pack years: 22.00    Types: Cigarettes    Start date: 02/05/1970    Last attempt to quit: 03/07/1992    Years since quitting: 26.4  . Smokeless tobacco: Never Used  . Tobacco comment: quit 21 years ago  Substance and Sexual Activity  . Alcohol use: Yes    Alcohol/week:  6.0 standard drinks    Types: 6 Cans of beer per week    Comment: social  . Drug use: No  . Sexual activity: Yes    Comment: lives with wife, no dietary restirctions  Lifestyle  . Physical activity:    Days per week: Not on file    Minutes per session: Not on file  . Stress: Not on file  Relationships  . Social connections:    Talks on phone: Not on file    Gets together: Not on file    Attends religious service: Not on file    Active member of club or organization: Not on file    Attends meetings of clubs or organizations: Not on file    Relationship status: Not on file  . Intimate partner violence:    Fear of current or ex partner: Not on file    Emotionally abused: Not on file    Physically abused: Not on file    Forced sexual activity: Not on file  Other Topics Concern  . Not on file  Social History Narrative  . Not on file     Current Meds  Medication Sig  . cholecalciferol (VITAMIN D) 1000 UNITS tablet Take 1,000 Units by mouth daily. 2 tablets daily  . Coenzyme Q10 (CO Q 10 PO) Take by mouth every morning.   Marland Kitchen esomeprazole (NEXIUM) 20 MG capsule TAKE 1 CAPSULE BY MOUTH ONCE DAILY  . GLUCOSAMINE HCL PO Take by mouth every morning.   Nyoka Cowden Tea, Camillia sinensis, (GREEN TEA PO) Take 315 mg by mouth every morning.   . lactobacillus acidophilus (BACID) TABS tablet  Take 1 tablet by mouth daily.   Marland Kitchen losartan (COZAAR) 50 MG tablet Take 1 tablet (50 mg total) by mouth daily.  . Multiple Vitamin (MULTI-VITAMIN PO) Take by mouth every morning.   . mupirocin ointment (BACTROBAN) 2 % Apply to affected area TID for 7 days.  Marland Kitchen NIACIN CR PO Take 500 mg by mouth daily.   . Omega-3 Fatty Acids (FISH OIL CONCENTRATE PO) Take by mouth every morning.   . psyllium (METAMUCIL) 58.6 % powder Take 1 packet by mouth 2 (two) times daily.   . rosuvastatin (CRESTOR) 40 MG tablet TAKE 1 TABLET (40 MG TOTAL) BY MOUTH DAILY.  . SEB-PREV WASH 10 % LIQD   . Zinc 30 MG TABS Take by mouth every morning.    Allergies:  Allergies  Allergen Reactions  . Doxycycline     Sore throat, red tongue, red skin     Review of Systems: General:   Denies fever, chills, unexplained weight loss.  Optho/Auditory:   Denies visual changes, blurred vision/LOV Respiratory:   Denies wheeze, DOE more than baseline levels.   Cardiovascular:   Denies chest pain, palpitations, new onset peripheral edema  Gastrointestinal:   Denies nausea, vomiting, diarrhea, abd pain.  Genitourinary: Denies dysuria, freq/ urgency, flank pain or discharge from genitals.  Endocrine:     Denies hot or cold intolerance, polyuria, polydipsia. Musculoskeletal:   Denies unexplained myalgias, joint swelling, unexplained arthralgias, gait problems.  Skin:  Denies new onset rash, suspicious lesions Neurological:     Denies dizziness, unexplained weakness, numbness  Psychiatric/Behavioral:   Denies mood changes, suicidal or homicidal ideations, hallucinations    Objective:   Blood pressure (!) 162/102, pulse 81, temperature 98.2 F (36.8 C), height 5\' 9"  (1.753 m), weight 161 lb 6.4 oz (73.2 kg), SpO2 98 %. Body mass index is 23.83 kg/m. General:  Well  Developed, well nourished, appropriate for stated age.  Neuro:  Alert and oriented,  extra-ocular muscles intact  HEENT:  Normocephalic, atraumatic, neck supple Skin:   no gross rash, warm, pink. Cardiac:  RRR, S1 S2 Respiratory:  ECTA B/L and A/P, Not using accessory muscles, speaking in full sentences- unlabored. Vascular:  Ext warm, no cyanosis apprec.; cap RF less 2 sec. Psych:  No HI/SI, judgement and insight good, Euthymic mood. Full Affect.

## 2018-08-10 NOTE — Telephone Encounter (Signed)
SupredniSONE (DELTASONE) 20 MG tablet [[id number]]   Order Details  Dose, Route, Frequency: As Directed   Indications of Use: Chronic back pain, degenerative disc disease with radiculopathy  Dispense Quantity: 14 tablet Refills: 0 Fills remaining: --        Sig: Take 1 tab TID 3 days.,Then 1 tabl BID for 3 days, then 1/2 tab BID for 3 days then 1/2 tab QD for 2 days          san @ [facility] [phone] says needs Clarification on this Rx:

## 2018-08-10 NOTE — Telephone Encounter (Signed)
RX was written for 14 tablets, which is quantity insufficient to meet sig.  Advised pharmacy to dispense #19 tablets based on directions for use.  Charyl Bigger, CMA

## 2018-08-10 NOTE — Addendum Note (Signed)
Addended by: Lanier Prude D on: 08/10/2018 02:21 PM   Modules accepted: Orders

## 2018-08-10 NOTE — Patient Instructions (Signed)
Hopefully George [name] you do not develop these symptoms but in case you get vertigo or dizziness see below   How to Treat Vertigo at Home with Exercises  What is Vertigo?  Vertigo is a relatively common symptom most often associated with conditions such as sinusitis (inflammation of your sinuses due to viruses, allergies, or bacterial infections), or an inner ear infection or ear trauma.   It can be brought on by trauma (e.g. a blow to the head or whiplash) or more serious things like minor strokes.   Symptoms can also be brought on by normal degenerative changes to your inner ear that occur with aging.  The condition tends to be more commonly seen in the elderly but it can occur in all ages.    Patients most often complain of dizziness, as if the room is spinning around them.   Symptoms are provoked by quick head movements or changes in position like going from standing to lying in bed, or even turning over in bed.   It may present with nausea and/or vomiting, and can be very debilitating to some folks.    By far the most common cause, known as Benign Paroxysmal Positional Vertigo (BPPV), is categorized by a sudden onset of symptoms, that are intense but short-lived (60 seconds or less), which is triggered by a change in head position.   Symptoms usually dissipate if you stay in one position and do not move your head.   Within the inner ear are collections of calcium carbonate crystals referred to as "otoliths" which may become dislodged from their normal position and migrate into the semicircular canals of the inner ear, throwing off your body's ability to sense where you are in space.     Fig. 921 Anatomy of the Right Osseous Labyrinth. Antonieta Iba. Anatomy of the Human Body. 1918.            What Else Could Be Behind My Vertigo?  Some other causes of vertigo include:  Meniere's disease (disorder of inner ear with ringing in ears, feeling of fullness/pressure within ear, and fluctuating  hearing loss) Tumours Neurological disorders e.g. Multiple Sclerosis Motion Sickness (lack of coordination between visual stimuli, inner ear balance and positional sense) Migraine Labyrinthitis (inflammation of the fluid-filled tubes and sacs within the inner ear; may also be associated with changes in hearing) Vestibular neuritis (inflammation of the nerves associated with transmission of sensory info from the inner ear; usually of viral origins)  How it can be treated/cured? While certain medications have been prescribed for vertigo including Lorazepam your doing well 7 house the house going organizing and getting things ready for sale with the and Meclizine (for motion sickness), there exists no evidence to support a recommendation of any medication in the routine treatment of BPPV.  Clinical trials have demonstrated that repositioning techniques (listed below) are a superior option for management Otis Dials et al., 2008).    Figure above:  (A) Instructions for the modified Epley procedure (MEP) for left ear posterior canal benign paroxysmal positional vertigo (PC-BPPV). For right ear BPPV, the procedure has to be performed in the opposite direction, starting with the head turned to the right side.  1. Start by sitting on a bed with your head turned 45 to the left. Place a pillow behind you so that on lying back it will be under your shoulders.  2. Lie back quickly with shoulders on the pillow, neck extended, and head resting on the bed. In this position, the affected (left)  ear is underneath. Wait for 30 secondS.  3. Turn your head 90 to the right (without raising it), and wait again for 30 seconds.  4. Turn your body and head another 90 to the right, and wait for another 30 seconds.  5. Sit up on the right side. This maneuver should be performed three times a day. Repeat this daily until you are free from positional vertigo for 24 hours.   (B) Instructions for the modified Semont maneuver  (MSM) for left ear PC-BPPV. For right ear BPPV, the maneuver has to be performed in the opposite direction, starting with the head turned toward the left ear.  1. Sit upright on a bed with your head turned 45 toward the right ear.  2. Drop quickly to the left side, so that your head touches the bed behind your left ear. Wait 30 seconds.  3. Move head and trunk in a swift movement toward the other side without stopping in the upright position, so that your head comes to rest on the right side of your forehead. Wait again for 30 seconds.  4. Sit up again.  This maneuver should be performed three times a day. Repeat this daily until you are free from positional vertigo symptoms for 24 hours.   (   See the video in the supplementary material on the NeurologyWeb site; go to http://www.neurology.org/content/63/1/150/F1.expansion.html   )     You can also try this motion at home as well- Self-Treatment of Benign Paroxysmal Positional Vertigo Benign Paroxysmal Positioning Vertigo is caused by loose inner ear crystals in the inner ear that migrate while sleeping to the back-bottom inner ear balance canal, the so-called "posterior semi-circular canal." The maneuver demonstrated below is the way to reposition the loose crystals so that the symptoms caused by the loose crystals go away. You may have a floating, swaying sense while walking or sitting for a few days after this procedure.

## 2018-08-12 ENCOUNTER — Other Ambulatory Visit: Payer: Self-pay

## 2018-08-12 DIAGNOSIS — H9202 Otalgia, left ear: Secondary | ICD-10-CM

## 2018-08-12 DIAGNOSIS — R0982 Postnasal drip: Secondary | ICD-10-CM

## 2018-08-12 DIAGNOSIS — J302 Other seasonal allergic rhinitis: Secondary | ICD-10-CM

## 2018-08-12 DIAGNOSIS — H6982 Other specified disorders of Eustachian tube, left ear: Secondary | ICD-10-CM

## 2018-08-12 MED ORDER — PREDNISONE 20 MG PO TABS: ORAL_TABLET | ORAL | 0 refills | Status: AC

## 2018-08-14 ENCOUNTER — Other Ambulatory Visit: Payer: Self-pay | Admitting: Family Medicine

## 2018-08-14 DIAGNOSIS — R0982 Postnasal drip: Secondary | ICD-10-CM

## 2018-08-14 DIAGNOSIS — H6982 Other specified disorders of Eustachian tube, left ear: Secondary | ICD-10-CM

## 2018-08-14 DIAGNOSIS — H9202 Otalgia, left ear: Secondary | ICD-10-CM

## 2018-08-14 DIAGNOSIS — J302 Other seasonal allergic rhinitis: Secondary | ICD-10-CM

## 2018-09-01 ENCOUNTER — Ambulatory Visit: Payer: No Typology Code available for payment source | Admitting: Hematology & Oncology

## 2018-09-01 ENCOUNTER — Other Ambulatory Visit: Payer: PPO

## 2018-09-06 ENCOUNTER — Telehealth: Payer: Self-pay | Admitting: Family Medicine

## 2018-09-06 NOTE — Telephone Encounter (Signed)
Pt's wife called states pt missed lab draw frm 6/10 cancelled OV w/ Dr. Marin Olp & request they be drawn on 6/18 here @ [facility] ---Pt also request Dr. Raliegh Scarlet order Labs for discussion on his 6/24 TELEHEALTH visit w/ her .  --glh

## 2018-09-06 NOTE — Telephone Encounter (Signed)
Please advise 

## 2018-09-07 ENCOUNTER — Other Ambulatory Visit: Payer: Self-pay

## 2018-09-07 DIAGNOSIS — I10 Essential (primary) hypertension: Secondary | ICD-10-CM

## 2018-09-07 NOTE — Telephone Encounter (Signed)
Visit patient know that we asked him to follow up in 6 months for recheck on his cholesterol.  Last labs were in December.  Hence, the only labs that I need him to get his a fasting lipid profile.  Then, he will have an office visit about a week later and please make sure he brings in a log of his blood pressures as his blood pressure was very high last office visit.  Thank you.    As well, Dr. Marin Olp would have to send [name] over a order of what labs to get and what the diagnosis codes are in order for [name] to draw his labs which should not be a problem though.

## 2018-09-07 NOTE — Progress Notes (Signed)
Error. MPulliam, CMA/RT(R)  

## 2018-09-07 NOTE — Telephone Encounter (Signed)
Patient notified and informed that I will have to check some of the lab orders and see if they can be done through [place], CMA/RT(R)

## 2018-09-08 ENCOUNTER — Other Ambulatory Visit: Payer: Self-pay

## 2018-09-08 ENCOUNTER — Encounter: Payer: Self-pay | Admitting: Gastroenterology

## 2018-09-08 DIAGNOSIS — E782 Mixed hyperlipidemia: Secondary | ICD-10-CM

## 2018-09-08 NOTE — Telephone Encounter (Signed)
Called the patient to inform him that we are not able to do some of the labs that the cancer center ordered.  Left message for the patient to call the office. MPulliam, CMA/RT(R)

## 2018-09-09 ENCOUNTER — Other Ambulatory Visit: Payer: PPO

## 2018-09-10 ENCOUNTER — Ambulatory Visit: Payer: No Typology Code available for payment source | Admitting: Family Medicine

## 2018-09-15 ENCOUNTER — Ambulatory Visit: Payer: PPO | Admitting: Family Medicine

## 2018-09-16 ENCOUNTER — Other Ambulatory Visit: Payer: Self-pay | Admitting: Family Medicine

## 2018-09-16 DIAGNOSIS — I10 Essential (primary) hypertension: Secondary | ICD-10-CM

## 2018-09-16 MED FILL — LOSARTAN POTASSIUM 50 MG TA: 50 | 30 days supply | Qty: 30 | Fill #0

## 2018-09-17 ENCOUNTER — Inpatient Hospital Stay: Payer: PPO | Attending: Hematology & Oncology

## 2018-09-17 ENCOUNTER — Other Ambulatory Visit: Payer: Self-pay

## 2018-09-17 ENCOUNTER — Encounter: Payer: Self-pay | Admitting: Hematology & Oncology

## 2018-09-17 ENCOUNTER — Inpatient Hospital Stay (HOSPITAL_BASED_OUTPATIENT_CLINIC_OR_DEPARTMENT_OTHER): Payer: PPO | Admitting: Hematology & Oncology

## 2018-09-17 VITALS — BP 157/89 | HR 71 | Temp 98.0°F | Resp 20 | Wt 158.0 lb

## 2018-09-17 DIAGNOSIS — E782 Mixed hyperlipidemia: Secondary | ICD-10-CM

## 2018-09-17 DIAGNOSIS — C9110 Chronic lymphocytic leukemia of B-cell type not having achieved remission: Secondary | ICD-10-CM | POA: Insufficient documentation

## 2018-09-17 LAB — CBC WITH DIFFERENTIAL (CANCER CENTER ONLY)
Abs Immature Granulocytes: 0.08 10*3/uL — ABNORMAL HIGH (ref 0.00–0.07)
Basophils Absolute: 0.2 10*3/uL — ABNORMAL HIGH (ref 0.0–0.1)
Basophils Relative: 0 %
Eosinophils Absolute: 0.1 10*3/uL (ref 0.0–0.5)
Eosinophils Relative: 0 %
HCT: 40.8 % (ref 39.0–52.0)
Hemoglobin: 13.4 g/dL (ref 13.0–17.0)
Immature Granulocytes: 0 %
Lymphocytes Relative: 87 %
Lymphs Abs: 43.2 10*3/uL — ABNORMAL HIGH (ref 0.7–4.0)
MCH: 31.6 pg (ref 26.0–34.0)
MCHC: 32.8 g/dL (ref 30.0–36.0)
MCV: 96.2 fL (ref 80.0–100.0)
Monocytes Absolute: 1.8 10*3/uL — ABNORMAL HIGH (ref 0.1–1.0)
Monocytes Relative: 4 %
Neutro Abs: 4.3 10*3/uL (ref 1.7–7.7)
Neutrophils Relative %: 9 %
Platelet Count: 270 10*3/uL (ref 150–400)
RBC: 4.24 MIL/uL (ref 4.22–5.81)
RDW: 14.3 % (ref 11.5–15.5)
WBC Count: 49.6 10*3/uL — ABNORMAL HIGH (ref 4.0–10.5)
nRBC: 0 % (ref 0.0–0.2)

## 2018-09-17 LAB — CMP (CANCER CENTER ONLY)
ALT: 32 U/L (ref 0–44)
AST: 41 U/L (ref 15–41)
Albumin: 4.6 g/dL (ref 3.5–5.0)
Alkaline Phosphatase: 59 U/L (ref 38–126)
Anion gap: 12 (ref 5–15)
BUN: 7 mg/dL — ABNORMAL LOW (ref 8–23)
CO2: 25 mmol/L (ref 22–32)
Calcium: 9.4 mg/dL (ref 8.9–10.3)
Chloride: 98 mmol/L (ref 98–111)
Creatinine: 0.68 mg/dL (ref 0.61–1.24)
GFR, Est AFR Am: >60 mL/min (ref >60)
GFR, Estimated: >60 mL/min (ref >60)
Glucose, Bld: 78 mg/dL (ref 70–99)
Potassium: 3.8 mmol/L (ref 3.5–5.1)
Sodium: 135 mmol/L (ref 135–145)
Total Bilirubin: 0.6 mg/dL (ref 0.3–1.2)
Total Protein: 7 g/dL (ref 6.5–8.1)

## 2018-09-17 LAB — SAVE SMEAR(SSMR), FOR PROVIDER SLIDE REVIEW

## 2018-09-17 NOTE — Progress Notes (Signed)
Hematology and Oncology Follow Up Visit  George [name] [id number] [birth year] 68 y.o. 09/17/2018   Principle Diagnosis:   CLL-stage A  Current Therapy:    Observation     Interim History:  [name] is back for followup.  So far, he is doing quite well.  He and his wife just got back from a camping trip into the [place].  As always, they had a wonderful time.  He is doing great.  He is exercising.  He is on his road bike 30 miles a day.  He enjoys this.  He has had no problem with infections.  He has had no fever.  He has had no rashes.  He did have a tick bite on his right lower leg recently.  He got the tick off before he could suck any blood out of him.  He has had no change in bowel or bladder habits.  Overall, his performance status is ECOG 0.  Medications:  Current Outpatient Medications:  .  cholecalciferol (VITAMIN D) 1000 UNITS tablet, Take 1,000 Units by mouth daily. 2 tablets daily, Disp: , Rfl:  .  Coenzyme Q10 (CO Q 10 PO), Take by mouth every morning. , Disp: , Rfl:  .  esomeprazole (NEXIUM) 20 MG capsule, TAKE 1 CAPSULE BY MOUTH ONCE DAILY, Disp: 90 capsule, Rfl: 1 .  fluticasone (FLONASE) 50 MCG/ACT nasal spray, 1 spray each nostril following sinus rinses twice daily, Disp: 16 g, Rfl: 2 .  GLUCOSAMINE HCL PO, Take by mouth every morning. , Disp: , Rfl:  .  Green Tea, Camillia sinensis, (GREEN TEA PO), Take 315 mg by mouth every morning. , Disp: , Rfl:  .  lactobacillus acidophilus (BACID) TABS tablet, Take 1 tablet by mouth daily. , Disp: , Rfl:  .  losartan (COZAAR) 50 MG tablet, TAKE 1 TABLET (50 MG TOTAL) BY MOUTH DAILY., Disp: 90 tablet, Rfl: 1 .  Multiple Vitamin (MULTI-VITAMIN PO), Take by mouth every morning. , Disp: , Rfl:  .  NIACIN CR PO, Take 500 mg by mouth daily. , Disp: , Rfl:  .  Omega-3 Fatty Acids (FISH OIL CONCENTRATE PO), Take by mouth every morning. , Disp: , Rfl:  .  psyllium (METAMUCIL) 58.6 % powder, Take 1 packet by  mouth 2 (two) times daily. , Disp: , Rfl:  .  rosuvastatin (CRESTOR) 40 MG tablet, TAKE 1 TABLET (40 MG TOTAL) BY MOUTH DAILY., Disp: 90 tablet, Rfl: 1 .  Zinc 30 MG TABS, Take by mouth every morning., Disp: , Rfl:  .  mupirocin ointment (BACTROBAN) 2 %, Apply to affected area TID for 7 days. (Patient not taking: Reported on 09/17/2018), Disp: 30 g, Rfl: 3 .  SEB-PREV WASH 10 % LIQD, , Disp: , Rfl: 3  Allergies:  Allergies  Allergen Reactions  . Doxycycline     Sore throat, red tongue, red skin    Past Medical History, Surgical history, Social history, and Family History were reviewed and updated.  Review of Systems: Review of Systems  Constitutional: Negative.   HENT: Negative.   Eyes: Negative.   Respiratory: Negative.   Cardiovascular: Negative.   Gastrointestinal: Negative.   Genitourinary: Negative.   Musculoskeletal: Negative.   Skin: Negative.   Neurological: Negative.   Endo/Heme/Allergies: Negative.   Psychiatric/Behavioral: Negative.      Physical Exam:  weight is 158 lb (71.7 kg). His oral temperature is 98 F (36.7 C). His blood pressure is 157/89 (abnormal) and his pulse is  71. His respiration is 20 and oxygen saturation is 99%.   Physical Exam Vitals signs reviewed.  HENT:     Head: Normocephalic and atraumatic.  Eyes:     Pupils: Pupils are equal, round, and reactive to light.  Neck:     Musculoskeletal: Normal range of motion.  Cardiovascular:     Rate and Rhythm: Normal rate and regular rhythm.     Heart sounds: Normal heart sounds.  Pulmonary:     Effort: Pulmonary effort is normal.     Breath sounds: Normal breath sounds.  Abdominal:     General: Bowel sounds are normal.     Palpations: Abdomen is soft.  Musculoskeletal: Normal range of motion.        General: No tenderness or deformity.  Lymphadenopathy:     Cervical: No cervical adenopathy.  Skin:    General: Skin is warm and dry.     Findings: No erythema or rash.  Neurological:      Mental Status: He is alert and oriented to person, place, and time.  Psychiatric:        Behavior: Behavior normal.        Thought Content: Thought content normal.        Judgment: Judgment normal.      Lab Results  Component Value Date   WBC 49.6 (H) 09/17/2018   HGB 13.4 09/17/2018   HCT 40.8 09/17/2018   MCV 96.2 09/17/2018   PLT 270 09/17/2018     Chemistry      Component Value Date/Time   NA 135 09/17/2018 0803   NA 138 03/12/2018 0927   K 3.8 09/17/2018 0803   CL 98 09/17/2018 0803   CO2 25 09/17/2018 0803   BUN 7 (L) 09/17/2018 0803   BUN 9 03/12/2018 0927   CREATININE 0.68 09/17/2018 0803   CREATININE 0.73 10/15/2015 0909      Component Value Date/Time   CALCIUM 9.4 09/17/2018 0803   ALKPHOS 59 09/17/2018 0803   AST 41 09/17/2018 0803   ALT 32 09/17/2018 0803   BILITOT 0.6 09/17/2018 0803         Impression and Plan: George [name] is a 68 year old gentleman. He has CLL.  It does look like his disease might be more active now.  We have been seeing him for about 12 years.   For right now, I am happy that his white cell count is come down a little bit.  I told him that this is very typical for what CLL does.  I think we can get back in a year now.  I feel confident that his CLL is not going to get out of control.  His wife is a former [employer] for our cancer center.  She certainly will know if there is any problems they will call us.   Volanda Napoleon, MD 6/26/20209:04 AM

## 2018-09-18 LAB — IGG, IGA, IGM
IgA: 119 mg/dL (ref 61–437)
IgG (Immunoglobin G), Serum: 716 mg/dL (ref 603–1613)
IgM (Immunoglobulin M), Srm: 71 mg/dL (ref 20–172)

## 2018-09-20 LAB — KAPPA/LAMBDA LIGHT CHAINS
Kappa free light chain: 17.2 mg/L (ref 3.3–19.4)
Kappa, lambda light chain ratio: 1.76 — ABNORMAL HIGH (ref 0.26–1.65)
Lambda free light chains: 9.8 mg/L (ref 5.7–26.3)

## 2018-09-21 LAB — PROTEIN ELECTROPHORESIS, SERUM, WITH REFLEX
A/G Ratio: 2 — ABNORMAL HIGH (ref 0.7–1.7)
Albumin ELP: 4.4 g/dL (ref 2.9–4.4)
Alpha-1-Globulin: 0.2 g/dL (ref 0.0–0.4)
Alpha-2-Globulin: 0.7 g/dL (ref 0.4–1.0)
Beta Globulin: 0.8 g/dL (ref 0.7–1.3)
Gamma Globulin: 0.5 g/dL (ref 0.4–1.8)
Globulin, Total: 2.2 g/dL (ref 2.2–3.9)
Total Protein ELP: 6.6 g/dL (ref 6.0–8.5)

## 2018-09-22 ENCOUNTER — Encounter: Payer: Self-pay | Admitting: Family Medicine

## 2018-09-22 ENCOUNTER — Ambulatory Visit (INDEPENDENT_AMBULATORY_CARE_PROVIDER_SITE_OTHER): Payer: PPO | Admitting: Family Medicine

## 2018-09-22 ENCOUNTER — Other Ambulatory Visit: Payer: Self-pay

## 2018-09-22 VITALS — BP 122/86 | HR 70 | Temp 98.6°F | Ht 69.0 in | Wt 158.0 lb

## 2018-09-22 DIAGNOSIS — I10 Essential (primary) hypertension: Secondary | ICD-10-CM | POA: Diagnosis not present

## 2018-09-22 DIAGNOSIS — J302 Other seasonal allergic rhinitis: Secondary | ICD-10-CM

## 2018-09-22 DIAGNOSIS — C9110 Chronic lymphocytic leukemia of B-cell type not having achieved remission: Secondary | ICD-10-CM | POA: Diagnosis not present

## 2018-09-22 DIAGNOSIS — E782 Mixed hyperlipidemia: Secondary | ICD-10-CM | POA: Diagnosis not present

## 2018-09-22 NOTE — Progress Notes (Signed)
Virtual / live video office visit note for [employer], D.O- Primary Care Physician at [facility]   I connected with current patient today and beyond visually recognizing the correct individual, I verified that I am speaking with the correct person using two identifiers.  . Location of the patient: Home . Location of the provider: Office Only the patient (+/- their family members at pt's discretion) and myself were participating in the encounter    - This visit type was conducted due to national recommendations for restrictions regarding the COVID-19 Pandemic (e.g. social distancing) in an effort to limit this patient's exposure and mitigate transmission in our community.  This format is felt to be most appropriate for this patient at this time.   - The patient did have access to video technology today  - No physical exam could be performed with this format, beyond that communicated to [name] by the patient/ family members as noted.   - Additionally my office staff/ schedulers discussed with the patient that there may be a monetary charge related to this service, depending on patient's medical insurance.   The patient expressed understanding, and agreed to proceed.      History of Present Illness:  Patient last seen 08/10/2026 with blood pressure at that time of 170/92.  It was a sick visit and he was not feeling well however, he is here for follow-up blood pressure today.  Htn:   Running low at home, gets higher ones at doc visits- at home  120's/80's,  Checks once [place].  Recently seen by Dr Marin Olp:   Apparently didn't add lipid panel on- they did not obtain --> HLD last checked Dec 2019-with elevated HDL but LDL was up.  We are going to recheck in 6 months which would have been recently Dr. Antonieta Pert office.  Patient has been exercising and eating a prudent diet.  He has been doing tons of cardio about 400 minutes/week or so.  CLL: Recently seen by Dr. Marin Olp and his white count  took almost a 15-20 point drop.  It was in the 60s now in the 40s.  Dr. Marin Olp was pleased with this and he was told to follow-up in about 1 year.  Dr. Marin Olp knows patient gets CBC in December with me and he will follow back up in June with his oncologist.  Seasonal allergies: Seen back in May for flareup.  Doing well.  No complaints today.  Trying to be preventative is possible  Impression and Recommendations:    1. Hyperlipidemia, mixed   2. Essential hypertension   3. CLL (chronic lymphocytic leukemia) ([place])   4. Seasonal allergic rhinitis, unspecified trigger    Hyperlipidemia: -Asked my CMA to please call the lab core office and see if they can add on a fasting lipid profile.  Orders given that if they cannot, asked her to put future order in and make sure patient schedules this in the near future at our office.  Patient wants it checked.  Hypertension: -Blood pressure well controlled at home and seems to have a little whitecoat syndrome when he goes to the doctor's offices. -Continue home monitoring and as long as running under 140/90 on a regular basis, we are happy. -Continue to monitor CMP etc.  CLL: -We will recheck CBC in December and patient will continue to see Dr. Marin Olp around every June.  Will notify Dr. Marin Olp sooner if there are issues or concerns  Seasonal allergies: -Preventive strategies discussed with  patient, over-the-counter medicines discussed.  - As part of my medical decision making, I reviewed the following data within the [place] History obtained from pt /family, CMA notes reviewed and incorporated if applicable, Labs reviewed, Radiograph/ tests reviewed if applicable and OV notes from prior OV's with me, as well as other specialists she/he has seen since seeing me last, were all reviewed and used in my medical decision making process today.   - Additionally, discussion had with patient regarding txmnt plan, their biases about that plan  etc were used in my medical decision making today.   - The patient agreed with the plan and demonstrated an understanding of the instructions.   No barriers to understanding were identified.   - Red flag symptoms and signs discussed in detail.  Patient expressed understanding regarding what to do in case of emergency\ urgent symptoms.  The patient was advised to call back or seek an in-person evaluation if the symptoms worsen or if the condition fails to improve as anticipated.   Return for 2) 4 mo-HTN,HLD, CLL, vit D.     I provided 18 minutes of non-face-to-face time during this encounter,with over 50% of the time in direct counseling on patients medical conditions/ medical concerns.  Additional time was spent with charting and coordination of care after the actual visit commenced.   Note:  This note was prepared with assistance of Dragon voice recognition software. Occasional wrong-word or sound-a-like substitutions may have occurred due to the inherent limitations of voice recognition software.  Mellody Dance, DO 09/22/2018 9:40 AM   Patient Care Team    Relationship Specialty Notifications Start End  Mellody Dance, DO PCP - General Family Medicine  08/21/15   Devra Dopp, MD Referring Physician Dermatology  04/02/16   Volanda Napoleon, MD Consulting Physician Oncology  04/02/16    Comment: CLL  Mansouraty, Telford Nab., MD Consulting Physician Gastroenterology  09/22/18     -Vitals obtained; medications/ allergies reconciled;  personal medical, social, Sx etc.histories were updated by CMA, reviewed by me and are reflected in chart  Patient Active Problem List   Diagnosis Date Noted  . Essential hypertension 09/09/2017    Priority: High  . Hyperlipidemia, mixed 04/21/2013    Priority: High  . Mildly Elevated AST (SGOT) 09/09/2017    Priority: Medium  . CLL (chronic lymphocytic leukemia) ([place]) 10/14/2011    Priority: Medium  . Muscle cramp 04/21/2013    Priority:  Low  . GERD (gastroesophageal reflux disease) 10/22/2010    Priority: Low  . Acute bronchitis 05/23/2015  . Tremor of right hand 04/17/2014  . Encounter for preventive health examination 10/23/2013  . Tick bite 08/04/2013  . Cellulitis 08/04/2013     Current Meds  Medication Sig  . cholecalciferol (VITAMIN D) 1000 UNITS tablet Take 1,000 Units by mouth daily. 2 tablets daily  . Coenzyme Q10 (CO Q 10 PO) Take by mouth every morning.   Marland Kitchen esomeprazole (NEXIUM) 20 MG capsule TAKE 1 CAPSULE BY MOUTH ONCE DAILY  . fluticasone (FLONASE) 50 MCG/ACT nasal spray 1 spray each nostril following sinus rinses twice daily  . GLUCOSAMINE HCL PO Take by mouth every morning.   [place] Cowden Tea, Camillia sinensis, (GREEN TEA PO) Take 315 mg by mouth every morning.   . lactobacillus acidophilus (BACID) TABS tablet Take 1 tablet by mouth daily.   Marland Kitchen losartan (COZAAR) 50 MG tablet TAKE 1 TABLET (50 MG TOTAL) BY MOUTH DAILY.  . Multiple Vitamin (MULTI-VITAMIN PO) Take  by mouth every morning.   . mupirocin ointment (BACTROBAN) 2 % Apply to affected area TID for 7 days.  Marland Kitchen NIACIN CR PO Take 500 mg by mouth daily.   . Omega-3 Fatty Acids (FISH OIL CONCENTRATE PO) Take by mouth every morning.   . psyllium (METAMUCIL) 58.6 % powder Take 1 packet by mouth 2 (two) times daily.   . rosuvastatin (CRESTOR) 40 MG tablet TAKE 1 TABLET (40 MG TOTAL) BY MOUTH DAILY.  . SEB-PREV WASH 10 % LIQD   . Zinc 30 MG TABS Take by mouth every morning.     Allergies  Allergen Reactions  . Doxycycline     Sore throat, red tongue, red skin     ROS:  See above HPI for pertinent positives and negatives   Objective:   Blood pressure 122/86, pulse 70, temperature 98.6 F (37 C), height 5\' 9"  (1.753 m), weight 158 lb (71.7 kg).  (if some vitals are omitted, this means that patient was UNABLE to obtain them even though they were asked to get them prior to OV today.  They were asked to call us at their earliest convenience with these  once obtained.)  General: A & O * 3; visually in no acute distress; in usual state of health.  Skin: Visible skin appears normal and pt's usual skin color HEENT:  EOMI, head is normocephalic and atraumatic.  Sclera are anicteric. Neck has a good range of motion.  Lips are noncyanotic Chest: normal chest excursion and movement Respiratory: speaking in full sentences, no conversational dyspnea; no use of accessory muscles Psych: insight good, mood- appears full

## 2018-09-23 ENCOUNTER — Other Ambulatory Visit: Payer: Self-pay | Admitting: Family Medicine

## 2018-09-23 DIAGNOSIS — E782 Mixed hyperlipidemia: Secondary | ICD-10-CM | POA: Diagnosis not present

## 2018-09-27 LAB — LIPID PANEL
Chol/HDL Ratio: 2.7 ratio (ref 0.0–5.0)
Cholesterol, Total: 218 mg/dL — ABNORMAL HIGH (ref 100–199)
HDL: 82 mg/dL (ref >39)
LDL Calculated: 120 mg/dL — ABNORMAL HIGH (ref 0–99)
Triglycerides: 80 mg/dL (ref 0–149)
VLDL Cholesterol Cal: 16 mg/dL (ref 5–40)

## 2018-09-27 LAB — SPECIMEN STATUS REPORT

## 2018-09-30 ENCOUNTER — Other Ambulatory Visit: Payer: Self-pay | Admitting: Family Medicine

## 2018-10-15 MED FILL — LOSARTAN POTASSIUM 50 MG TA: 50 | 30 days supply | Qty: 30 | Fill #1

## 2018-11-22 MED FILL — ESOMEPRAZOLE MAG DR 20 MG C: 20 | 90 days supply | Qty: 90 | Fill #1

## 2018-11-22 MED FILL — LOSARTAN POTASSIUM 50 MG TA: 50 | 30 days supply | Qty: 30 | Fill #2

## 2018-11-22 MED FILL — ROSUVASTATIN CALCIUM 40 MG: 40 | 90 days supply | Qty: 90 | Fill #1

## 2019-01-02 MED FILL — LOSARTAN POTASSIUM 50 MG TA: 50 | 30 days supply | Qty: 30 | Fill #3

## 2019-01-14 ENCOUNTER — Ambulatory Visit (INDEPENDENT_AMBULATORY_CARE_PROVIDER_SITE_OTHER): Payer: PPO

## 2019-01-14 ENCOUNTER — Other Ambulatory Visit: Payer: Self-pay

## 2019-01-14 VITALS — Temp 98.2°F

## 2019-01-14 DIAGNOSIS — Z23 Encounter for immunization: Secondary | ICD-10-CM | POA: Diagnosis not present

## 2019-01-14 NOTE — Progress Notes (Signed)
Pt here for influenza vaccine.  Screening questionnaire reviewed, VIS provided to patient, and any/all patient questions answered.  T. Lona Six, CMA  

## 2019-02-04 MED FILL — LOSARTAN POTASSIUM 50 MG TA: 50 | 30 days supply | Qty: 30 | Fill #4

## 2019-02-07 DIAGNOSIS — L814 Other melanin hyperpigmentation: Secondary | ICD-10-CM | POA: Diagnosis not present

## 2019-02-07 DIAGNOSIS — D225 Melanocytic nevi of trunk: Secondary | ICD-10-CM | POA: Diagnosis not present

## 2019-02-07 DIAGNOSIS — L570 Actinic keratosis: Secondary | ICD-10-CM | POA: Diagnosis not present

## 2019-02-07 DIAGNOSIS — L821 Other seborrheic keratosis: Secondary | ICD-10-CM | POA: Diagnosis not present

## 2019-02-07 DIAGNOSIS — Z23 Encounter for immunization: Secondary | ICD-10-CM | POA: Diagnosis not present

## 2019-03-10 ENCOUNTER — Other Ambulatory Visit: Payer: Self-pay | Admitting: Family Medicine

## 2019-03-10 DIAGNOSIS — K219 Gastro-esophageal reflux disease without esophagitis: Secondary | ICD-10-CM

## 2019-03-10 MED FILL — LOSARTAN POTASSIUM 50 MG TA: 50 | 30 days supply | Qty: 30 | Fill #5

## 2019-03-10 MED FILL — ROSUVASTATIN CALCIUM 40 MG: 40 | 30 days supply | Qty: 30 | Fill #0

## 2019-03-10 MED FILL — ESOMEPRAZOLE MAG DR 20 MG C: 20 | 30 days supply | Qty: 30 | Fill #0

## 2019-03-21 ENCOUNTER — Encounter: Payer: Self-pay | Admitting: Hematology & Oncology

## 2019-03-21 ENCOUNTER — Encounter: Payer: Self-pay | Admitting: Family Medicine

## 2019-04-11 ENCOUNTER — Encounter: Payer: Self-pay | Admitting: Family Medicine

## 2019-04-11 ENCOUNTER — Encounter: Payer: Self-pay | Admitting: Hematology & Oncology

## 2019-04-11 ENCOUNTER — Other Ambulatory Visit: Payer: Self-pay | Admitting: Family Medicine

## 2019-04-11 DIAGNOSIS — I10 Essential (primary) hypertension: Secondary | ICD-10-CM

## 2019-04-11 DIAGNOSIS — K219 Gastro-esophageal reflux disease without esophagitis: Secondary | ICD-10-CM

## 2019-04-12 ENCOUNTER — Ambulatory Visit: Payer: PPO

## 2019-04-12 MED FILL — ROSUVASTATIN CALCIUM 40 MG: 40 | 15 days supply | Qty: 15 | Fill #0

## 2019-04-12 MED FILL — ESOMEPRAZOLE MAG DR 20 MG C: 20 | 15 days supply | Qty: 15 | Fill #0

## 2019-04-12 MED FILL — LOSARTAN POTASSIUM 50 MG TA: 50 | 15 days supply | Qty: 15 | Fill #0

## 2019-04-13 ENCOUNTER — Ambulatory Visit (INDEPENDENT_AMBULATORY_CARE_PROVIDER_SITE_OTHER): Payer: PPO | Admitting: Family Medicine

## 2019-04-13 ENCOUNTER — Encounter: Payer: Self-pay | Admitting: Family Medicine

## 2019-04-13 ENCOUNTER — Other Ambulatory Visit: Payer: Self-pay

## 2019-04-13 VITALS — BP 130/87 | Temp 98.6°F | Ht 66.5 in | Wt 158.0 lb

## 2019-04-13 DIAGNOSIS — C9110 Chronic lymphocytic leukemia of B-cell type not having achieved remission: Secondary | ICD-10-CM

## 2019-04-13 DIAGNOSIS — I10 Essential (primary) hypertension: Secondary | ICD-10-CM

## 2019-04-13 DIAGNOSIS — R7401 Elevation of levels of liver transaminase levels: Secondary | ICD-10-CM

## 2019-04-13 DIAGNOSIS — K219 Gastro-esophageal reflux disease without esophagitis: Secondary | ICD-10-CM

## 2019-04-13 DIAGNOSIS — E559 Vitamin D deficiency, unspecified: Secondary | ICD-10-CM | POA: Insufficient documentation

## 2019-04-13 DIAGNOSIS — E782 Mixed hyperlipidemia: Secondary | ICD-10-CM

## 2019-04-13 DIAGNOSIS — R252 Cramp and spasm: Secondary | ICD-10-CM | POA: Diagnosis not present

## 2019-04-13 MED ORDER — ESOMEPRAZOLE MAGNESIUM 20 MG PO CPDR: 20.0000 mg | DELAYED_RELEASE_CAPSULE | Freq: Every day | ORAL | 1 refills | Status: DC

## 2019-04-13 MED ORDER — LOSARTAN POTASSIUM 50 MG PO TABS: 50.0000 mg | ORAL_TABLET | Freq: Every day | ORAL | 1 refills | Status: DC

## 2019-04-13 MED ORDER — ROSUVASTATIN CALCIUM 40 MG PO TABS: 40.0000 mg | ORAL_TABLET | Freq: Every day | ORAL | 1 refills | Status: DC

## 2019-04-13 NOTE — Progress Notes (Signed)
Telehealth office visit note for George [name], D.O- at Primary Care at [facility]   I connected with current patient today and verified that I am speaking with the correct person using two identifiers.   . Location of the patient: Home . Location of the provider: Office Only the patient (+/- their family members at pt's discretion) and myself were participating in the encounter - This visit type was conducted due to national recommendations for restrictions regarding the COVID-19 Pandemic (e.g. social distancing) in an effort to limit this patient's exposure and mitigate transmission in our community.  This format is felt to be most appropriate for this patient at this time.   - No physical exam could be performed with this format, beyond that communicated to George [name] by the patient/ family members as noted.   - Additionally my office staff/ schedulers discussed with the patient that there may be a monetary charge related to this service, depending on their medical insurance.   The patient expressed understanding, and agreed to proceed.       History of Present Illness: Hypertension and Hyperlipidemia   I, George [name], am serving as scribe for George [name].  Per patient, believes he last had his full set of blood work in June or July.  Notes he's feeling fine "other than the isolation."  Denies concerns with memory etc.  The patient was supposed to obtain his COVID-19 vaccination recently and had not heard from George [name], so he cancelled his appointment.  Patient says "I'm having a little issue with trying to make that decision to be honest."  Notes Dr. Antonieta [name] nurse eventually did respond yesterday, but that she said "it was really up to the patient whether they wish to get it or not."  He is concerned that the vaccine has not been tested in CLL patients and notes his wife, as a biology major, is a little leery of everything as well.  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  Says "I stay busy."  Everything's good at home; his wife recently retired.    - GERD Notes "no problem whatsoever" with reflux.  He takes his Nexium daily.  HPI:  Hypertension:  -  His blood pressure at home has been running: Notes "it varies, depends on the time of day I check it and stuff like that."  Says it has never been over 130/87 at any point, "it just depends on what news channel I'm watching."  - Patient reports good compliance with medication and/or lifestyle modification  - His denies acute concerns or problems related to treatment plan  - He denies new onset of: chest pain, exercise intolerance, shortness of breath, dizziness, visual changes, headache, lower extremity swelling or claudication.   Last 3 blood pressure readings in our office are as follows: BP Readings from Last 3 Encounters:  04/13/19 130/87  09/22/18 122/86  09/17/18 (!) 157/89   Filed Weights   04/13/19 0857  Weight: 158 lb (71.7 kg)    HPI:  Hyperlipidemia:  69 y.o. male here for cholesterol follow-up.   Occasionally has muscle cramps when he first wakes up in the morning.  Notes "he lays in the bed and stretches, and sometimes the calf in the back of my right leg will cramp, and so I have to work that out.  But after I get up, I have no issue."  Takes his Crestor  nightly.  - Patient reports good compliance with treatment plan of:  medication and/ or lifestyle management.    - Patient denies any acute concerns or problems with management plan   - He denies new onset of: myalgias, arthralgias, increased fatigue more than normal, chest pains, exercise intolerance, shortness of breath, dizziness, visual changes, headache, lower extremity swelling or claudication.   Most recent cholesterol panel was:  Lab Results  Component Value Date   CHOL 218 (H) 09/23/2018   HDL 82 09/23/2018   LDLCALC 120 (H)  09/23/2018   TRIG 80 09/23/2018   CHOLHDL 2.7 09/23/2018   Hepatic Function Latest Ref Rng & Units 09/17/2018 03/12/2018 03/03/2018  Total Protein 6.5 - 8.1 g/dL 7.0 7.1 6.9  Albumin 3.5 - 5.0 g/dL 4.6 5.0(H) 4.5  AST 15 - 41 U/L 41 33 35  ALT 0 - 44 U/L 32 19 21  Alk Phosphatase 38 - 126 U/L 59 73 66  Total Bilirubin 0.3 - 1.2 mg/dL 0.6 0.8 1.0  Bilirubin, Direct 0.0 - 0.3 mg/dL - - -       GAD 7 : Generalized Anxiety Score 09/22/2018  Nervous, Anxious, on Edge 0  Control/stop worrying 0  Worry too much - different things 0  Trouble relaxing 0  Restless 0  Easily annoyed or irritable 0  Afraid - awful might happen 0  Total GAD 7 Score 0  Anxiety Difficulty Not difficult at all    Depression screen [facility] 2/9 04/13/2019 09/22/2018 08/10/2018 03/12/2018 11/02/2017  Decreased Interest 0 0 0 0 0  Down, Depressed, Hopeless 0 0 0 0 0  PHQ - 2 Score 0 0 0 0 0  Altered sleeping 0 0 0 0 0  Tired, decreased energy 0 0 0 0 0  Change in appetite 0 0 0 0 0  Feeling bad or failure about yourself  0 0 0 0 0  Trouble concentrating 0 0 0 0 0  Moving slowly or fidgety/restless 0 0 0 0 0  Suicidal thoughts 0 0 0 0 0  PHQ-9 Score 0 0 0 0 0  Difficult doing work/chores - - Not difficult at all Not difficult at all Not difficult at all      Impression and Recommendations:    1. Essential hypertension   2. Hyperlipidemia, mixed   3. Mildly Elevated AST (SGOT)   4. CLL (chronic lymphocytic leukemia) ([place])   5. Muscle cramp   6. Gastroesophageal reflux disease, unspecified whether esophagitis present   7. Vitamin D insufficiency      Vitamin D Insufficiency - Stable at this time. - Continue multivitamin / supplementation as prescribed. - Will continue to monitor and re-check as discussed.  Muscle Cramp - Stable at this time. - Per patient, only occurs in the morning, then resolves for the rest of the day. - Encouraged patient to hydrate adequately. - Lab work will be drawn near  future. - Will continue to monitor.  Essential Hypertension - Blood pressure currently is at goal, stable. - Patient will continue current treatment regimen  - Counseled patient on pathophysiology of disease and discussed various treatment options, which always includes dietary and lifestyle modification as first line.   - Lifestyle changes such as dash and heart healthy diets and engaging in a regular exercise program discussed extensively with patient.   - Ambulatory blood pressure monitoring encouraged at least 3 times weekly.  Keep log and bring in every office visit.  Reminded patient that if they ever feel poorly  in any way, to check their blood pressure and pulse.  - We will continue to monitor  Hyperlipidemia Last FLP: Triglycerides = 80, down from 105 prior. HDL = 82, up from 68 prior. LDL = 120 elevated six months ago, up from 117, 97 prior.  - Discussed that LDL was elevated last check. - Need for re-check near future.  Pt will continue current treatment regimen.  - Prudent dietary changes such as low saturated & trans fat diets for hyperlipidemia and low carb diets for hypertriglyceridemia discussed with patient.    Encouraged patient to follow AHA guidelines for regular exercise.  We will continue to monitor and re-check as discussed.  CLL - Followed by George [name] - Stable at this time. - Patient is asymptomatic. - Will continue to monitor alongside specialist.  GERD - Stable at this time, well-controlled on Nexium. - Continue management as prescribed. - Will continue to monitor.  COVID-19 Vaccine Counseling - - Novel Covid -19 counseling done; all questions were answered.   - Current CDC / federal and [place] guidelines reviewed with patient  - Reminded pt of extreme importance of social distancing; wearing a mask when out in public; insensate handwashing and cleaning of surfaces, avoiding unnecessary trips for shopping and avoiding ALL but emergency  appts etc. - Told patient to be prepared, not scared; and be smart for the sake of others - Patient will call with any additional concerns  - Extensive education and counseling provided to patient today.  All questions answered.  - Discussed that since the patient is immunocompromised given his CLL diagnosis, strongly encouraged patient to obtain COVID-19 vaccination.  However, reviewed that Dr. Antonieta [name] should be the final recommendation.  Told patient to call Dr. Antonieta [name] office for further information and consultation.  - Patient knows to call any time with concerns as needed.  Health Counseling & Preventative Maintenance - Advised patient to continue working toward exercising to improve overall mental, physical, and emotional health.    - Reviewed the "spokes of the wheel" of mood and immunity management.  Stressed the importance of ongoing prudent habits, including regular exercise, appropriate sleep hygiene, healthful dietary habits, and prayer/meditation to relax.  - Encouraged patient to engage in daily physical activity, especially a formal exercise routine.  Recommended that the patient eventually strive for at least 150 minutes of moderate cardiovascular activity per week according to guidelines established by the [facility]   - Healthy dietary habits encouraged, including low-carb, and high amounts of lean protein in diet.   - Patient should also consume adequate amounts of water.  - Health counseling performed.  All questions answered.  Recommendations - Need for full fasting blood work. - Patient will continue to follow up with George [name] as established. - Last Medicare Wellness obtained December 2019.    - As part of my medical decision making, I reviewed the following data within the [place] History obtained from pt /family, CMA notes reviewed and incorporated if applicable, Labs reviewed, Radiograph/ tests reviewed if applicable and OV notes from prior  OV's with me, as well as other specialists she/he has seen since seeing me last, were all reviewed and used in my medical decision making process today.    - Additionally, discussion had with patient regarding our treatment plan, and their biases/concerns about that plan were used in my medical decision making today.    - The patient agreed with the plan and demonstrated an understanding of the instructions.  No barriers to understanding were identified.    - Red flag symptoms and signs discussed in detail.  Patient expressed understanding regarding what to do in case of emergency\ urgent symptoms.   - The patient was advised to call back or seek an in-person evaluation if the symptoms worsen or if the condition fails to improve as anticipated.   Return for f/up next 2-35mofor Medicare wellness and full FBW.    Orders Placed This Encounter  Procedures  . CBC with Differential/Platelet  . Comprehensive metabolic panel  . Hemoglobin A1c  . Lipid panel  . Magnesium  . T4, free  . TSH  . VITAMIN D 25 Hydroxy (Vit-D Deficiency, Fractures)    Meds ordered this encounter  Medications  . esomeprazole (NEXIUM) 20 MG capsule    Sig: Take 1 capsule (20 mg total) by mouth daily.    Dispense:  90 capsule    Refill:  1  . rosuvastatin (CRESTOR) 40 MG tablet    Sig: Take 1 tablet (40 mg total) by mouth daily.    Dispense:  90 tablet    Refill:  1  . losartan (COZAAR) 50 MG tablet    Sig: Take 1 tablet (50 mg total) by mouth daily.    Dispense:  90 tablet    Refill:  1    Medications Discontinued During This Encounter  Medication Reason  . losartan (COZAAR) 50 MG tablet Reorder  . rosuvastatin (CRESTOR) 40 MG tablet Reorder  . esomeprazole (NEXIUM) 20 MG capsule Reorder     I provided 20+ minutes of non face-to-face time during this encounter.  Additional time was spent with charting and coordination of care before and after the actual visit commenced.   Note:  This note was  prepared with assistance of Dragon voice recognition software. Occasional wrong-word or sound-a-like substitutions may have occurred due to the inherent limitations of voice recognition software.   This document serves as a record of services personally performed by DMellody Dance DO. It was created on her behalf by KToni [name] a trained medical scribe. The creation of this record is based on the scribe's personal observations and the provider's statements to them.   This case required medical decision making of at least moderate complexity. The above documentation has been reviewed to be accurate and was completed by DMarjory Sneddon D.O.     Patient Care Team    Relationship Specialty Notifications Start End  OMellody Dance DO PCP - General Family Medicine  08/21/15   HDevra Dopp MD Referring Physician Dermatology  04/02/16   EVolanda Napoleon MD Consulting Physician Oncology  04/02/16    Comment: CLL  Mansouraty, GTelford Nab, MD Consulting Physician Gastroenterology  09/22/18      -Vitals obtained; medications/ allergies reconciled;  personal medical, social, Sx etc.histories were updated by CMA, reviewed by me and are reflected in chart   Patient Active Problem List   Diagnosis Date Noted  . Essential hypertension 09/09/2017  . Hyperlipidemia, mixed 04/21/2013  . Mildly Elevated AST (SGOT) 09/09/2017  . CLL (chronic lymphocytic leukemia) ([place] 10/14/2011  . Muscle cramp 04/21/2013  . GERD (gastroesophageal reflux disease) 10/22/2010  . Vitamin D insufficiency 04/13/2019  . Acute bronchitis 05/23/2015  . Tremor of right hand 04/17/2014  . Encounter for preventive health examination 10/23/2013  . Tick bite 08/04/2013  . Cellulitis 08/04/2013     Current Meds  Medication Sig  . cholecalciferol (VITAMIN D) 1000 UNITS tablet Take 1,000 Units by  mouth daily. 2 tablets daily  . Coenzyme Q10 (CO Q 10 PO) Take by mouth every morning.   Marland Kitchen esomeprazole  (NEXIUM) 20 MG capsule Take 1 capsule (20 mg total) by mouth daily.  . fluticasone (FLONASE) 50 MCG/ACT nasal spray 1 spray each nostril following sinus rinses twice daily  . GLUCOSAMINE HCL PO Take by mouth every morning.   Nyoka Cowden Tea, Camillia sinensis, (GREEN TEA PO) Take 315 mg by mouth every morning.   . lactobacillus acidophilus (BACID) TABS tablet Take 1 tablet by mouth daily.   Marland Kitchen losartan (COZAAR) 50 MG tablet Take 1 tablet (50 mg total) by mouth daily.  . Multiple Vitamin (MULTI-VITAMIN PO) Take by mouth every morning.   . mupirocin ointment (BACTROBAN) 2 % Apply to affected area TID for 7 days.  Marland Kitchen NIACIN CR PO Take 500 mg by mouth daily.   . Omega-3 Fatty Acids (FISH OIL CONCENTRATE PO) Take by mouth every morning.   . psyllium (METAMUCIL) 58.6 % powder Take 1 packet by mouth 2 (two) times daily.   . rosuvastatin (CRESTOR) 40 MG tablet Take 1 tablet (40 mg total) by mouth daily.  . SEB-PREV WASH 10 % LIQD   . Zinc 30 MG TABS Take by mouth every morning.  . [DISCONTINUED] esomeprazole (NEXIUM) 20 MG capsule TAKE 1 CAPSULE BY MOUTH DAILY. **PATIENT NEEDS APT FOR FURTHER REFILLS**  . [DISCONTINUED] losartan (COZAAR) 50 MG tablet Take 1 tablet (50 mg total) by mouth daily. **PATIENT NEEDS APT FOR FURTHER REFILLS**  . [DISCONTINUED] rosuvastatin (CRESTOR) 40 MG tablet TAKE 1 TABLET BY MOUTH DAILY. **PATIENT NEEDS APT FOR FURTHER REFILLS**     Allergies:  Allergies  Allergen Reactions  . Doxycycline     Sore throat, red tongue, red skin     ROS:  See above HPI for pertinent positives and negatives   Objective:   Blood pressure 130/87, temperature 98.6 F (37 C), height 5' 6.5" (1.689 m), weight 158 lb (71.7 kg).  (if some vitals are omitted, this means that patient was UNABLE to obtain them even though they were asked to get them prior to OV today.  They were asked to call George [name] at their earliest convenience with these once obtained. )  General: A & O * 3; sounds in no acute  distress; in usual state of health.  Skin: Pt confirms warm and dry extremities and pink fingertips HEENT: Pt confirms lips non-cyanotic Chest: Patient confirms normal chest excursion and movement Respiratory: speaking in full sentences, no conversational dyspnea; patient confirms no use of accessory muscles Psych: insight appears good, mood- appears full

## 2019-04-26 IMAGING — US US ABDOMINAL AORTA SCREENING AAA
1 series · 14 of 24 positions shown · non-contrast
Comparison: None.

CLINICAL DATA: Male between 65-75 years of age with a smoking
history.

EXAM:
US ABDOMINAL AORTA MEDICARE SCREENING
TECHNIQUE: Ultrasound examination of the abdominal aorta was performed as a
screening evaluation for abdominal aortic aneurysm.

[Series 1: us abdominal aorta screening aaa · 0.23mm/px · 14 of 24 slices shown]
[im 1/24]
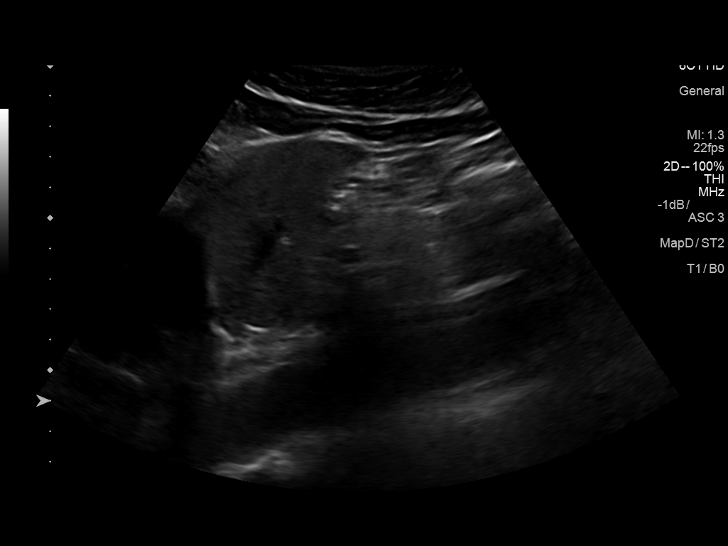
[im 3/24]
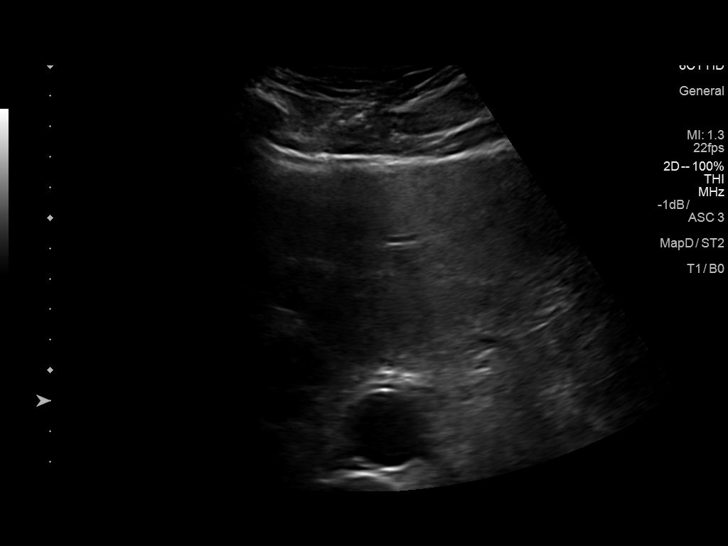
[im 5/24]
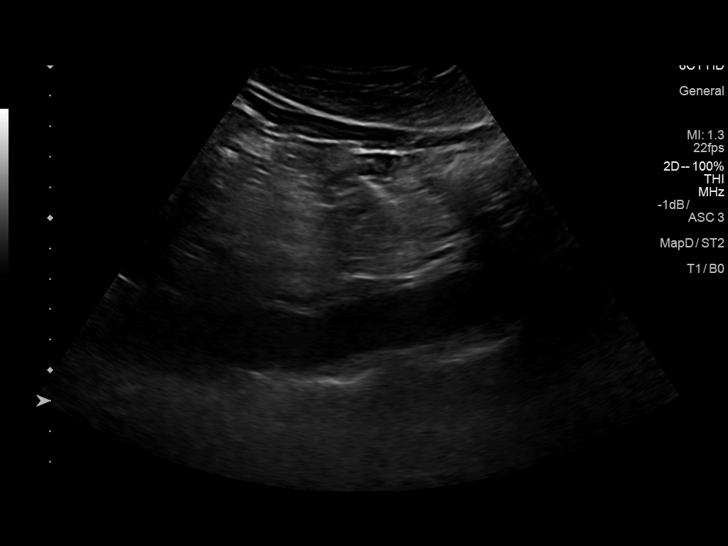
[im 7/24]
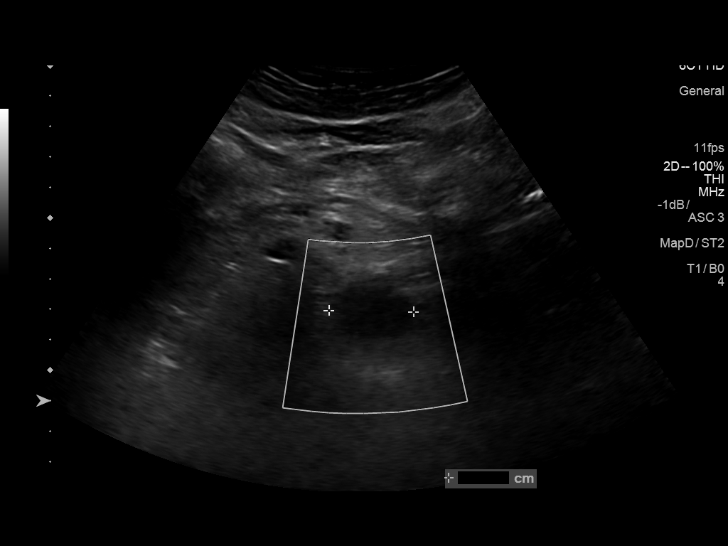
[im 8/24]
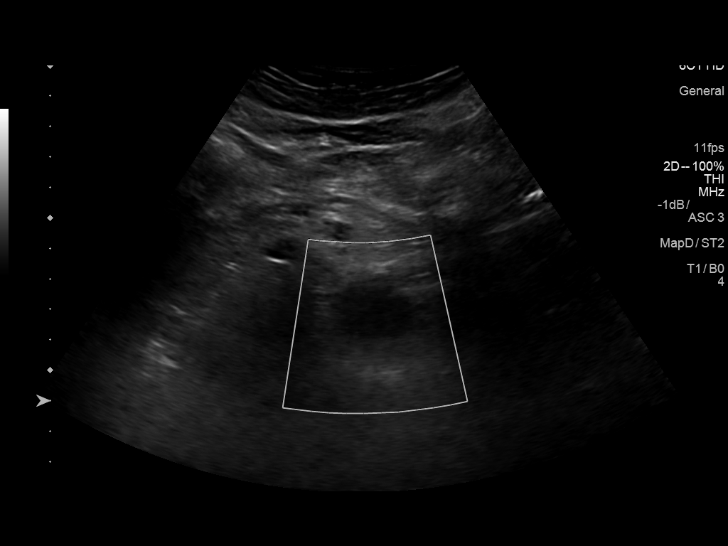
[im 10/24]
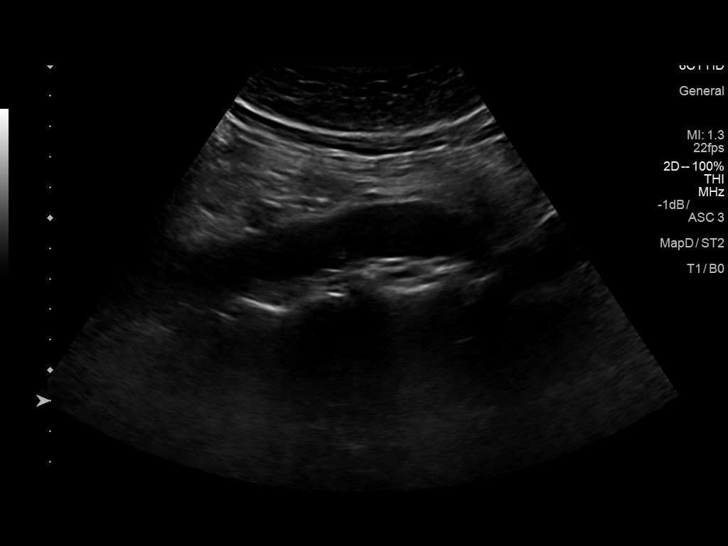
[im 12/24]
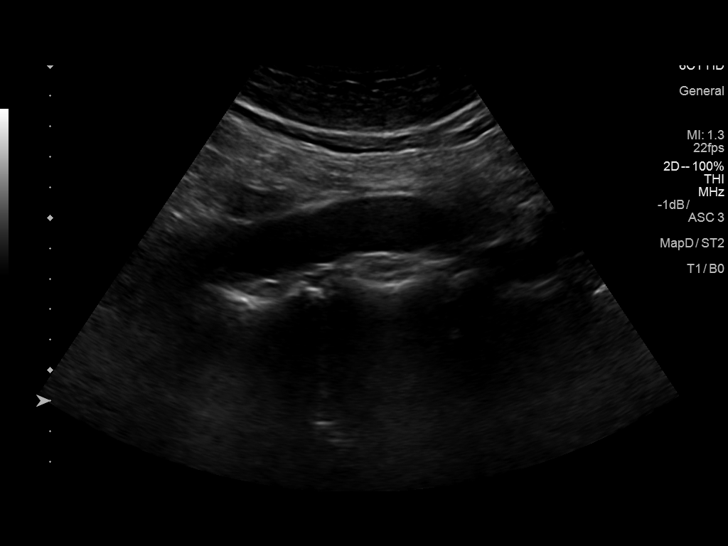
[im 13/24]
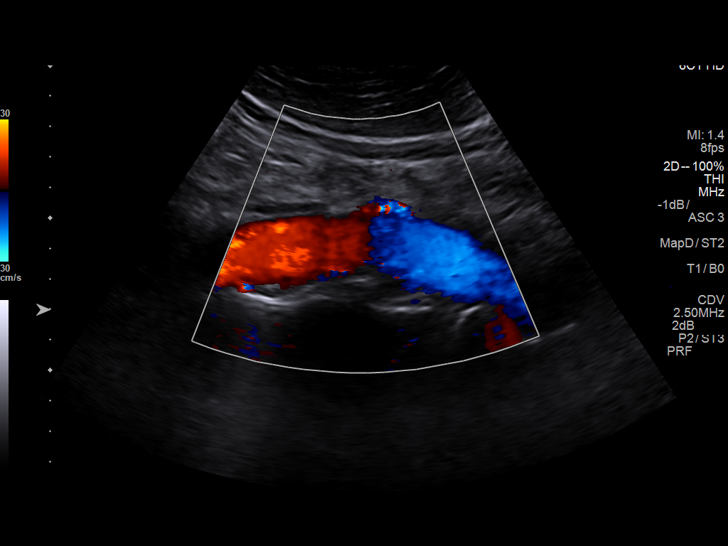
[im 15/24]
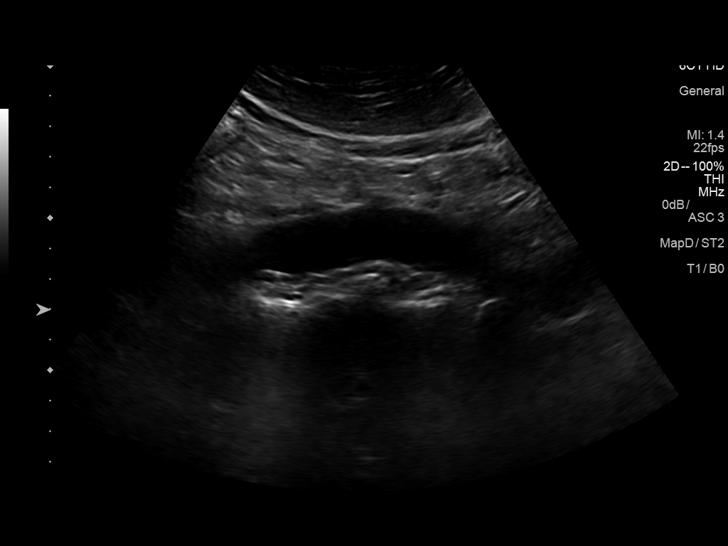
[im 17/24]
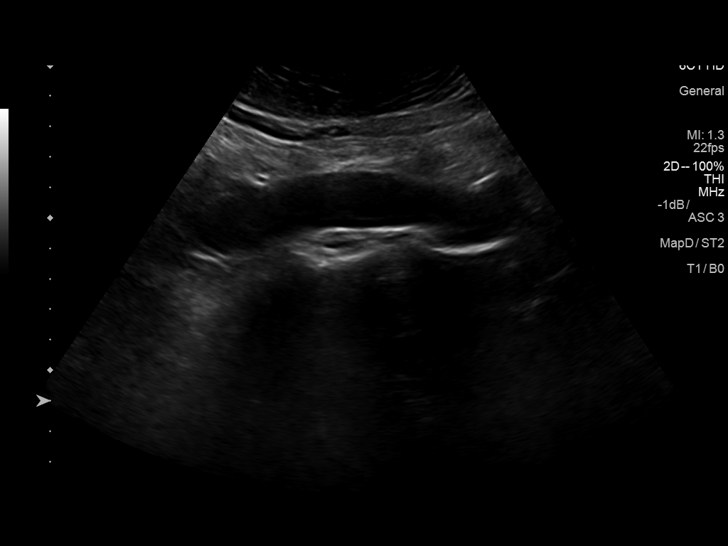
[im 19/24]
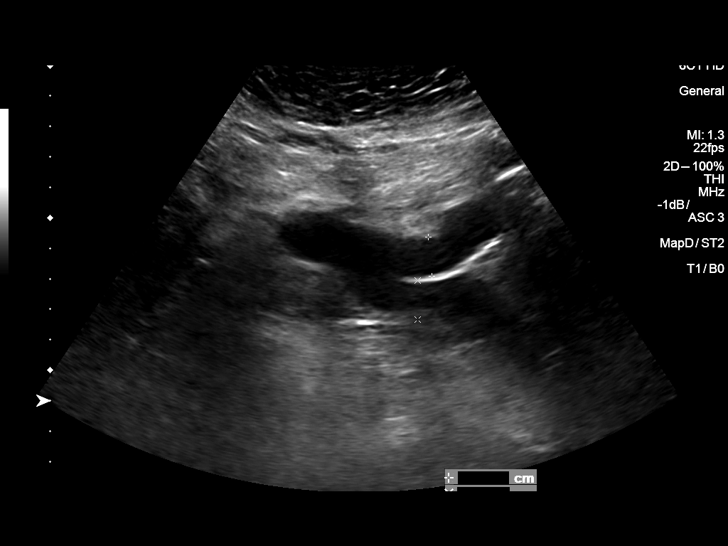
[im 20/24]
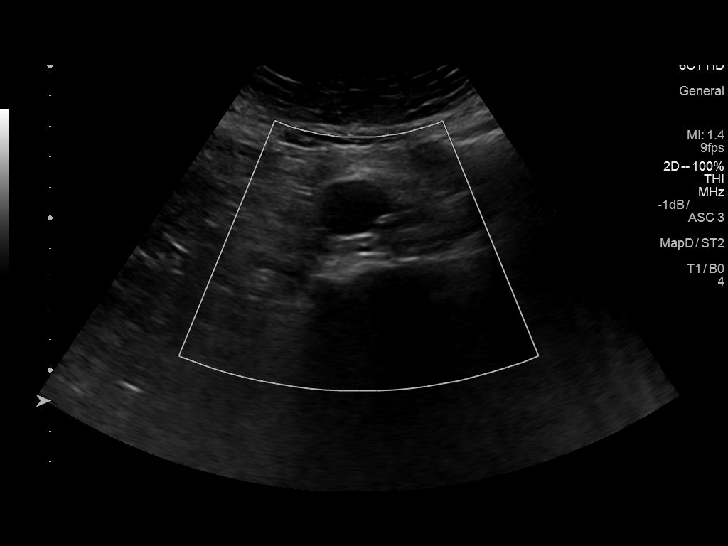
[im 22/24]
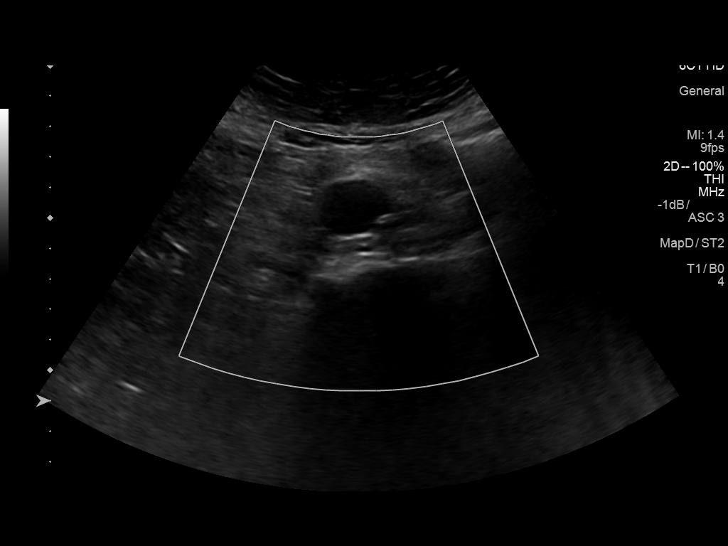
[im 24/24]
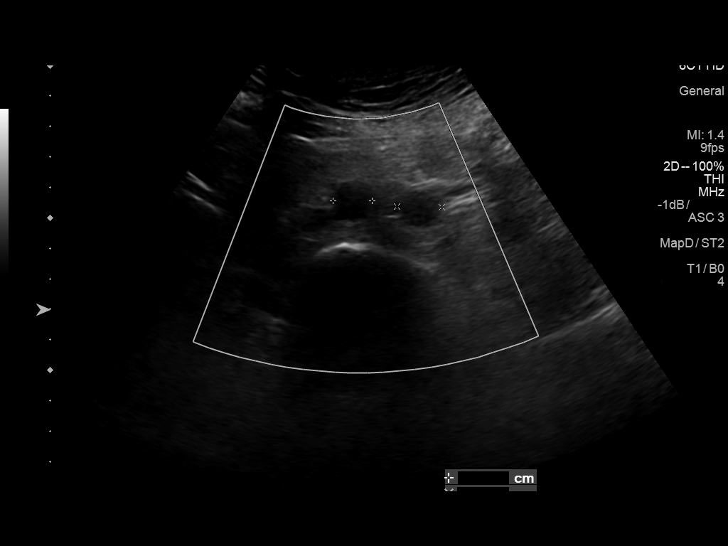

[14 of 24 positions shown; findings below may reference images not displayed]

FINDINGS: Abdominal aortic measurements as follows:

Proximal:  2 9 x 2.6 cm

Mid:  2.7 x 2.4 cm

Distal:  2.7 x 2.6 cm

There is a minimal amount of atherosclerotic plaque within the
abdominal aorta.
IMPRESSION: 1. Mild ectasia of the abdominal aorta. Recommend follow-up aortic
ultrasound in 5 years. This recommendation follows ACR consensus
guidelines: White Paper of the ACR Incidental Findings Committee II
2.  Aortic Atherosclerosis (VPVF4-20O.O).

## 2019-05-02 MED FILL — ROSUVASTATIN CALCIUM 40 MG: 40 | 90 days supply | Qty: 90 | Fill #0

## 2019-05-02 MED FILL — LOSARTAN POTASSIUM 50 MG TA: 50 | 90 days supply | Qty: 90 | Fill #0

## 2019-05-02 MED FILL — ESOMEPRAZOLE MAG DR 20 MG C: 20 | 90 days supply | Qty: 90 | Fill #0

## 2019-06-15 ENCOUNTER — Other Ambulatory Visit: Payer: Self-pay

## 2019-06-15 ENCOUNTER — Encounter: Payer: Self-pay | Admitting: Family Medicine

## 2019-06-15 ENCOUNTER — Ambulatory Visit (INDEPENDENT_AMBULATORY_CARE_PROVIDER_SITE_OTHER): Payer: PPO | Admitting: Family Medicine

## 2019-06-15 VITALS — BP 149/91 | HR 79 | Temp 97.8°F | Resp 12 | Ht 68.0 in | Wt 173.0 lb

## 2019-06-15 DIAGNOSIS — Z0000 Encounter for general adult medical examination without abnormal findings: Secondary | ICD-10-CM

## 2019-06-15 DIAGNOSIS — E559 Vitamin D deficiency, unspecified: Secondary | ICD-10-CM | POA: Diagnosis not present

## 2019-06-15 DIAGNOSIS — R7401 Elevation of levels of liver transaminase levels: Secondary | ICD-10-CM

## 2019-06-15 DIAGNOSIS — I10 Essential (primary) hypertension: Secondary | ICD-10-CM

## 2019-06-15 DIAGNOSIS — Z1211 Encounter for screening for malignant neoplasm of colon: Secondary | ICD-10-CM

## 2019-06-15 DIAGNOSIS — E782 Mixed hyperlipidemia: Secondary | ICD-10-CM | POA: Diagnosis not present

## 2019-06-15 NOTE — Patient Instructions (Signed)
Preventive Care 40 Years and Older, Male Preventive care refers to lifestyle choices and visits with your health care provider that can promote health and wellness. What does preventive care include?   A yearly physical exam. This is also called an annual well check.  Dental exams once or twice a year.  Routine eye exams. Ask your health care provider how often you should have your eyes checked.  Personal lifestyle choices, including: ? Daily care of your teeth and gums. ? Regular physical activity. ? Eating a healthy diet. ? Avoiding tobacco and drug use. ? Limiting alcohol use. ? Practicing safe sex. ? Taking low doses of aspirin every day. ? Taking vitamin and mineral supplements as recommended by your health care provider. What happens during an annual well check? The services and screenings done by your health care provider during your annual well check will depend on your age, overall health, lifestyle risk factors, and family history of disease. Counseling Your health care provider may ask you questions about your:  Alcohol use.  Tobacco use.  Drug use.  Emotional well-being.  Home and relationship well-being.  Sexual activity.  Eating habits.  History of falls.  Memory and ability to understand (cognition).  Work and work Statistician. Screening You may have the following tests or measurements:  Height, weight, and BMI.  Blood pressure.  Lipid and cholesterol levels. These may be checked every 5 years, or more frequently if you are over 31 years old.  Skin check.  Lung cancer screening. You may have this screening every year starting at age 55 if you have a 30-pack-year history of smoking and currently smoke or have quit within the past 15 years.  Colorectal cancer screening. All adults should have this screening starting at age 30 and continuing until age 6. You will have tests every 1-10 years, depending on your results and the type of screening  test. People at increased risk should start screening at an earlier age. Screening tests may include: ? Guaiac-based fecal occult blood testing. ? Fecal immunochemical test (FIT). ? Stool DNA test. ? Virtual colonoscopy. ? Sigmoidoscopy. During this test, a flexible tube with a tiny camera (sigmoidoscope) is used to examine your rectum and lower colon. The sigmoidoscope is inserted through your anus into your rectum and lower colon. ? Colonoscopy. During this test, a long, thin, flexible tube with a tiny camera (colonoscope) is used to examine your entire colon and rectum.  Prostate cancer screening. Recommendations will vary depending on your family history and other risks.  Hepatitis C blood test.  Hepatitis B blood test.  Sexually transmitted disease (STD) testing.  Diabetes screening. This is done by checking your blood sugar (glucose) after you have not eaten for a while (fasting). You may have this done every 1-3 years.  Abdominal aortic aneurysm (AAA) screening. You may need this if you are a current or former smoker.  Osteoporosis. You may be screened starting at age 74 if you are at high risk. Talk with your health care provider about your test results, treatment options, and if necessary, the need for more tests. Vaccines Your health care provider may recommend certain vaccines, such as:  Influenza vaccine. This is recommended every year.  Tetanus, diphtheria, and acellular pertussis (Tdap, Td) vaccine. You may need a Td booster every 10 years.  Varicella vaccine. You may need this if you have not been vaccinated.  Zoster vaccine. You may need this after age 76.  Measles, mumps, and rubella (MMR)  vaccine. You may need at least one dose of MMR if you were born in 1957 or later. You may also need a second dose.  Pneumococcal 13-valent conjugate (PCV13) vaccine. One dose is recommended after age 65.  Pneumococcal polysaccharide (PPSV23) vaccine. One dose is recommended  after age 65.  Meningococcal vaccine. You may need this if you have certain conditions.  Hepatitis A vaccine. You may need this if you have certain conditions or if you travel or work in places where you may be exposed to hepatitis A.  Hepatitis B vaccine. You may need this if you have certain conditions or if you travel or work in places where you may be exposed to hepatitis B.  Haemophilus influenzae type b (Hib) vaccine. You may need this if you have certain risk factors. Talk to your health care provider about which screenings and vaccines you need and how often you need them. This information is not intended to replace advice given to you by your health care provider. Make sure you discuss any questions you have with your health care provider. Document Released: 04/06/2015 Document Revised: 04/30/2017 Document Reviewed: 01/09/2015 Elsevier Interactive Patient Education  2019 [employer]    Preventive Care for Adults, Male A healthy lifestyle and preventive care can promote health and wellness. Preventive health guidelines for men include the following key practices:  A routine yearly physical is a good way to check with your health care provider about your health and preventative screening. It is a chance to share any concerns and updates on your health and to receive a thorough exam.  Visit your dentist for a routine exam and preventative care every 6 months. Brush your teeth twice a day and floss once a day. Good oral hygiene prevents tooth decay and gum disease.  The frequency of eye exams is based on your age, health, family medical history, use of contact lenses, and other factors. Follow your health care provider's recommendations for frequency of eye exams.  Eat a healthy diet. Foods such as vegetables, fruits, whole grains, low-fat dairy products, and lean protein foods contain the nutrients you need without too many calories. Decrease your intake of foods high in solid fats,  added sugars, and salt. Eat the right amount of calories for you.Get information about a proper diet from your health care provider, if necessary.  Regular physical exercise is one of the most important things you can do for your health. Most adults should get at least 150 minutes of moderate-intensity exercise (any activity that increases your heart rate and causes you to sweat) each week. In addition, most adults need muscle-strengthening exercises on 2 or more days a week.  Maintain a healthy weight. The body mass index (BMI) is a screening tool to identify possible weight problems. It provides an estimate of body fat based on height and weight. Your health care provider can find your BMI and can help you achieve or maintain a healthy weight.For adults 20 years and older:  A BMI below 18.5 is considered underweight.  A BMI of 18.5 to 24.9 is normal.  A BMI of 25 to 29.9 is considered overweight.  A BMI of 30 and above is considered obese.  Maintain normal blood lipids and cholesterol levels by exercising and minimizing your intake of saturated fat. Eat a balanced diet with plenty of fruit and vegetables. Blood tests for lipids and cholesterol should begin at age 20 and be repeated every 5 years. If your lipid or cholesterol levels are   high, you are over 50, or you are at high risk for heart disease, you may need your cholesterol levels checked more frequently.Ongoing high lipid and cholesterol levels should be treated with medicines if diet and exercise are not working.  If you smoke, find out from your health care provider how to quit. If you do not use tobacco, do not start.  Lung cancer screening is recommended for adults aged 55-80 years who are at high risk for developing lung cancer because of a history of smoking. A yearly low-dose CT scan of the lungs is recommended for people who have at least a 30-pack-year history of smoking and are a current smoker or have quit within the past 15  years. A pack year of smoking is smoking an average of 1 pack of cigarettes a day for 1 year (for example: 1 pack a day for 30 years or 2 packs a day for 15 years). Yearly screening should continue until the smoker has stopped smoking for at least 15 years. Yearly screening should be stopped for people who develop a health problem that would prevent them from having lung cancer treatment.  If you choose to drink alcohol, do not have more than 2 drinks per day. One drink is considered to be 12 ounces (355 mL) of beer, 5 ounces (148 mL) of wine, or 1.5 ounces (44 mL) of liquor.  Avoid use of street drugs. Do not share needles with anyone. Ask for help if you need support or instructions about stopping the use of drugs.  High blood pressure causes heart disease and increases the risk of stroke. Your blood pressure should be checked at least every 1-2 years. Ongoing high blood pressure should be treated with medicines, if weight loss and exercise are not effective.  If you are 45-79 years old, ask your health care provider if you should take aspirin to prevent heart disease.  Diabetes screening is done by taking a blood sample to check your blood glucose level after you have not eaten for a certain period of time (fasting). If you are not overweight and you do not have risk factors for diabetes, you should be screened once every 3 years starting at age 45. If you are overweight or obese and you are 40-70 years of age, you should be screened for diabetes every year as part of your cardiovascular risk assessment.  Colorectal cancer can be detected and often prevented. Most routine colorectal cancer screening begins at the age of 50 and continues through age 75. However, your health care provider may recommend screening at an earlier age if you have risk factors for colon cancer. On a yearly basis, your health care provider may provide home test kits to check for hidden blood in the stool. Use of a small camera  at the end of a tube to directly examine the colon (sigmoidoscopy or colonoscopy) can detect the earliest forms of colorectal cancer. Talk to your health care provider about this at age 50, when routine screening begins. Direct exam of the colon should be repeated every 5-10 years through age 75, unless early forms of precancerous polyps or small growths are found.  People who are at an increased risk for hepatitis B should be screened for this virus. You are considered at high risk for hepatitis B if:  You were born in a country where hepatitis B occurs often. Talk with your health care provider about which countries are considered high risk.  Your parents were born in   a high-risk country and you have not received a shot to protect against hepatitis B (hepatitis B vaccine).  You have HIV or AIDS.  You use needles to inject street drugs.  You live with, or have sex with, someone who has hepatitis B.  You are a man who has sex with other men (MSM).  You get hemodialysis treatment.  You take certain medicines for conditions such as cancer, organ transplantation, and autoimmune conditions.  Hepatitis C blood testing is recommended for all people born from 1945 through 1965 and any individual with known risks for hepatitis C.  Practice safe sex. Use condoms and avoid high-risk sexual practices to reduce the spread of sexually transmitted infections (STIs). STIs include gonorrhea, chlamydia, syphilis, trichomonas, herpes, HPV, and human immunodeficiency virus (HIV). Herpes, HIV, and HPV are viral illnesses that have no cure. They can result in disability, cancer, and death.  If you are a man who has sex with other men, you should be screened at least once per year for:  HIV.  Urethral, rectal, and pharyngeal infection of gonorrhea, chlamydia, or both.  If you are at risk of being infected with HIV, it is recommended that you take a prescription medicine daily to prevent HIV infection. This  is called preexposure prophylaxis (PrEP). You are considered at risk if:  You are a man who has sex with other men (MSM) and have other risk factors.  You are a heterosexual man, are sexually active, and are at increased risk for HIV infection.  You take drugs by injection.  You are sexually active with a partner who has HIV.  Talk with your health care provider about whether you are at high risk of being infected with HIV. If you choose to begin PrEP, you should first be tested for HIV. You should then be tested every 3 months for as long as you are taking PrEP.  A one-time screening for abdominal aortic aneurysm (AAA) and surgical repair of large AAAs by ultrasound are recommended for men ages 65 to 75 years who are current or former smokers.  Healthy men should no longer receive prostate-specific antigen (PSA) blood tests as part of routine cancer screening. Talk with your health care provider about prostate cancer screening.  Testicular cancer screening is not recommended for adult males who have no symptoms. Screening includes self-exam, a health care provider exam, and other screening tests. Consult with your health care provider about any symptoms you have or any concerns you have about testicular cancer.  Use sunscreen. Apply sunscreen liberally and repeatedly throughout the day. You should seek shade when your shadow is shorter than you. Protect yourself by wearing long sleeves, pants, a wide-brimmed hat, and sunglasses year round, whenever you are outdoors.  Once a month, do a whole-body skin exam, using a mirror to look at the skin on your back. Tell your health care provider about new moles, moles that have irregular borders, moles that are larger than a pencil eraser, or moles that have changed in shape or color.  Stay current with required vaccines (immunizations).  Influenza vaccine. All adults should be immunized every year.  Tetanus, diphtheria, and acellular pertussis (Td,  Tdap) vaccine. An adult who has not previously received Tdap or who does not know his vaccine status should receive 1 dose of Tdap. This initial dose should be followed by tetanus and diphtheria toxoids (Td) booster doses every 10 years. Adults with an unknown or incomplete history of completing a 3-dose immunization series with   of completing a 3-dose immunization series with Td-containing vaccines should begin or complete a primary immunization series including a Tdap dose. Adults should receive a Td booster every 10 years.  Varicella vaccine. An adult without evidence of immunity to varicella should receive 2 doses or a second dose if he has previously received 1 dose.  Human papillomavirus (HPV) vaccine. Males aged 11-21 years who have not received the vaccine previously should receive the 3-dose series. Males aged 22-26 years may be immunized. Immunization is recommended through the age of 8 years for any male who has sex with males and did not get any or all doses earlier. Immunization is recommended for any person with an immunocompromised condition through the age of 51 years if he did not get any or all doses earlier. During the 3-dose series, the second dose should be obtained 4-8 weeks after the first dose. The third dose should be obtained 24 weeks after the first dose and 16 weeks after the second dose.  Zoster vaccine. One dose is recommended for adults aged 48 years or older unless certain conditions are present.  Measles, mumps, and rubella (MMR) vaccine. Adults born before [birth year] generally are considered immune to measles and mumps. Adults born in [birth year] or later should have 1 or more doses of MMR vaccine unless there is a contraindication to the vaccine or there is laboratory evidence of immunity to each of the three diseases. A routine second dose of MMR vaccine should be obtained at least 28 days after the first dose for students attending postsecondary schools, health care workers, or international travelers.  People who received inactivated measles vaccine or an unknown type of measles vaccine during 1963-1967 should receive 2 doses of MMR vaccine. People who received inactivated mumps vaccine or an unknown type of mumps vaccine before 1979 and are at high risk for mumps infection should consider immunization with 2 doses of MMR vaccine. Unvaccinated health care workers born before [birth year] who lack laboratory evidence of measles, mumps, or rubella immunity or laboratory confirmation of disease should consider measles and mumps immunization with 2 doses of MMR vaccine or rubella immunization with 1 dose of MMR vaccine.  Pneumococcal 13-valent conjugate (PCV13) vaccine. When indicated, a person who is uncertain of his immunization history and has no record of immunization should receive the PCV13 vaccine. All adults 15 years of age and older should receive this vaccine. An adult aged 5 years or older who has certain medical conditions and has not been previously immunized should receive 1 dose of PCV13 vaccine. This PCV13 should be followed with a dose of pneumococcal polysaccharide (PPSV23) vaccine. Adults who are at high risk for pneumococcal disease should obtain the PPSV23 vaccine at least 8 weeks after the dose of PCV13 vaccine. Adults older than 68 years of age who have normal immune system function should obtain the PPSV23 vaccine dose at least 1 year after the dose of PCV13 vaccine.  Pneumococcal polysaccharide (PPSV23) vaccine. When PCV13 is also indicated, PCV13 should be obtained first. All adults aged 10 years and older should be immunized. An adult younger than age 54 years who has certain medical conditions should be immunized. Any person who resides in a nursing home or long-term care facility should be immunized. An adult smoker should be immunized. People with an immunocompromised condition and certain other conditions should receive both PCV13 and PPSV23 vaccines. People with human immunodeficiency  virus (HIV) infection should be immunized as soon as possible after diagnosis. Immunization during chemotherapy  use of PPSV23 vaccine is not recommended for American Indians, [place] Natives, or people younger than 65 years unless there are medical conditions that require PPSV23 vaccine. When indicated, people who have unknown immunization and have no record of immunization should receive PPSV23 vaccine. One-time revaccination 5 years after the first dose of PPSV23 is recommended for people aged 19-64 years who have chronic kidney failure, nephrotic syndrome, asplenia, or immunocompromised conditions. People who received 1-2 doses of PPSV23 before age 65 years should receive another dose of PPSV23 vaccine at age 65 years or later if at least 5 years have passed since the previous dose. Doses of PPSV23 are not needed for people immunized with PPSV23 at or after age 65 years.  Meningococcal vaccine. Adults with asplenia or persistent complement component deficiencies should receive 2 doses of quadrivalent meningococcal conjugate (MenACWY-D) vaccine. The doses should be obtained at least 2 months apart. Microbiologists working with certain meningococcal bacteria, military recruits, people at risk during an outbreak, and people who travel to or live in countries with a high rate of meningitis should be immunized. A first-year college student up through age 21 years who is living in a residence hall should receive a dose if he did not receive a dose on or after his 16th birthday. Adults who have certain high-risk conditions should receive one or more doses of vaccine.  Hepatitis A vaccine. Adults who wish to be protected from this disease, have chronic liver disease, work with hepatitis A-infected animals, work in hepatitis A research labs, or travel to or work in countries with a high rate of hepatitis A should be immunized. Adults who were previously unvaccinated and who anticipate close  contact with an international adoptee during the first 60 days after arrival in the [place] from a country with a high rate of hepatitis A should be immunized.  Hepatitis B vaccine. Adults should be immunized if they wish to be protected from this disease, are under age 59 years and have diabetes, have chronic liver disease, have had more than one sex partner in the past 6 months, may be exposed to blood or other infectious body fluids, are household contacts or sex partners of hepatitis B positive people, are clients or workers in certain care facilities, or travel to or work in countries with a high rate of hepatitis B.  Haemophilus influenzae type b (Hib) vaccine. A previously unvaccinated person with asplenia or sickle cell disease or having a scheduled splenectomy should receive 1 dose of Hib vaccine. Regardless of previous immunization, a recipient of a hematopoietic stem cell transplant should receive a 3-dose series 6-12 months after his successful transplant. Hib vaccine is not recommended for adults with HIV infection. Preventive Service / Frequency Ages 19 to 39  Blood pressure check.** / Every 3-5 years.  Lipid and cholesterol check.** / Every 5 years beginning at age 20.  Hepatitis C blood test.** / For any individual with known risks for hepatitis C.  Skin self-exam. / Monthly.  Influenza vaccine. / Every year.  Tetanus, diphtheria, and acellular pertussis (Tdap, Td) vaccine.** / Consult your health care provider. 1 dose of Td every 10 years.  Varicella vaccine.** / Consult your health care provider.  HPV vaccine. / 3 doses over 6 months, if 26 or younger.  Measles, mumps, rubella (MMR) vaccine.** / You need at least 1 dose of MMR if you were born in 1957 or later. You may also need a second dose.  Pneumococcal 13-valent conjugate (PCV13) vaccine.** /   Pneumococcal 13-valent conjugate (PCV13) vaccine.** / Consult your health care provider.  Pneumococcal polysaccharide (PPSV23) vaccine.** / 1 to 2 doses  if you smoke cigarettes or if you have certain conditions.  Meningococcal vaccine.** / 1 dose if you are age 30 to 49 years and a Market researcher living in a residence hall, or have one of several medical conditions. You may also need additional booster doses.  Hepatitis A vaccine.** / Consult your health care provider.  Hepatitis B vaccine.** / Consult your health care provider.  Haemophilus influenzae type b (Hib) vaccine.** / Consult your health care provider. Ages 22 to 67  Blood pressure check.** / Every year.  Lipid and cholesterol check.** / Every 5 years beginning at age 67.  Lung cancer screening. / Every year if you are aged 35-80 years and have a 30-pack-year history of smoking and currently smoke or have quit within the past 15 years. Yearly screening is stopped once you have quit smoking for at least 15 years or develop a health problem that would prevent you from having lung cancer treatment.  Fecal occult blood test (FOBT) of stool. / Every year beginning at age 86 and continuing until age 25. You may not have to do this test if you get a colonoscopy every 10 years.  Flexible sigmoidoscopy** or colonoscopy.** / Every 5 years for a flexible sigmoidoscopy or every 10 years for a colonoscopy beginning at age 50 and continuing until age 63.  Hepatitis C blood test.** / For all people born from [birth year] through 1965 and any individual with known risks for hepatitis C.  Skin self-exam. / Monthly.  Influenza vaccine. / Every year.  Tetanus, diphtheria, and acellular pertussis (Tdap/Td) vaccine.** / Consult your health care provider. 1 dose of Td every 10 years.  Varicella vaccine.** / Consult your health care provider.  Zoster vaccine.** / 1 dose for adults aged 70 years or older.  Measles, mumps, rubella (MMR) vaccine.** / You need at least 1 dose of MMR if you were born in 1957 or later. You may also need a second dose.  Pneumococcal 13-valent conjugate (PCV13)  vaccine.** / Consult your health care provider.  Pneumococcal polysaccharide (PPSV23) vaccine.** / 1 to 2 doses if you smoke cigarettes or if you have certain conditions.  Meningococcal vaccine.** / Consult your health care provider.  Hepatitis A vaccine.** / Consult your health care provider.  Hepatitis B vaccine.** / Consult your health care provider.  Haemophilus influenzae type b (Hib) vaccine.** / Consult your health care provider. Ages 48 and over  Blood pressure check.** / Every year.  Lipid and cholesterol check.**/ Every 5 years beginning at age 25.  Lung cancer screening. / Every year if you are aged 65-80 years and have a 30-pack-year history of smoking and currently smoke or have quit within the past 15 years. Yearly screening is stopped once you have quit smoking for at least 15 years or develop a health problem that would prevent you from having lung cancer treatment.  Fecal occult blood test (FOBT) of stool. / Every year beginning at age 54 and continuing until age 28. You may not have to do this test if you get a colonoscopy every 10 years.  Flexible sigmoidoscopy** or colonoscopy.** / Every 5 years for a flexible sigmoidoscopy or every 10 years for a colonoscopy beginning at age 26 and continuing until age 73.  Hepatitis C blood test.** / For all people born from [birth year] through 1964 and any individual with known risks  aneurysm (AAA) screening.** / A one-time screening for ages 65 to 75 years who are current or former smokers.  Skin self-exam. / Monthly.  Influenza vaccine. / Every year.  Tetanus, diphtheria, and acellular pertussis (Tdap/Td) vaccine.** / 1 dose of Td every 10 years.  Varicella vaccine.** / Consult your health care provider.  Zoster vaccine.** / 1 dose for adults aged 60 years or older.  Pneumococcal 13-valent conjugate (PCV13) vaccine.** / 1 dose for all adults aged 65 years and older.  Pneumococcal polysaccharide (PPSV23)  vaccine.** / 1 dose for all adults aged 65 years and older.  Meningococcal vaccine.** / Consult your health care provider.  Hepatitis A vaccine.** / Consult your health care provider.  Hepatitis B vaccine.** / Consult your health care provider.  Haemophilus influenzae type b (Hib) vaccine.** / Consult your health care provider. **Family history and personal history of risk and conditions may change your health care provider's recommendations.   This information is not intended to replace advice given to you by your health care provider. Make sure you discuss any questions you have with your health care provider.   Document Released: 05/06/2001 Document Revised: 03/31/2014 Document Reviewed: 08/05/2010 Elsevier Interactive Patient Education 2016 Elsevier Inc. 

## 2019-06-15 NOTE — Progress Notes (Signed)
Subjective:   George [name] is a 69 y.o. male who presents for Medicare Annual/Subsequent preventive examination.  HPI: Patient overall denies concerns today. Due to the weather, has been exercising less, but notes that they walk four miles every Sunday.      Objective:    Vitals: BP (!) 149/91   Pulse 79   Temp 97.8 F (36.6 C) (Oral)   Resp 12   Ht 5\' 8"  (1.727 m)   Wt 173 lb (78.5 kg)   SpO2 98%   BMI 26.30 kg/m   Body mass index is 26.3 kg/m.  Advanced Directives 09/17/2018 03/03/2018 03/04/2017 09/02/2016 07/24/2016 03/05/2016 08/21/2015  Does Patient Have a Medical Advance Directive? Yes Yes Yes No No Yes Yes  Type of Paramedic of [place];Living will [place];Living will [place];Living will - - [place];Living will Living will;Healthcare Power of Attorney  Does patient want to make changes to medical advance directive? - - No - Patient declined - - - No - Patient declined  Copy of [place] in Chart? - - No - copy requested - - No - copy requested No - copy requested  Would patient like information on creating a medical advance directive? - - - - - - -    Tobacco Social History   Tobacco Use  Smoking Status Former Smoker  . Packs/day: 1.00  . Years: 22.00  . Pack years: 22.00  . Types: Cigarettes  . Start date: 02/05/1970  . Quit date: 03/07/1992  . Years since quitting: 27.2  Smokeless Tobacco Never Used  Tobacco Comment   quit 21 years ago     Counseling given: Not Answered Comment: quit 21 years ago   Clinical Intake:                       Past Medical History:  Diagnosis Date  . 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 Colon polyp   . Elevated BP 04/23/2014  . GERD (gastroesophageal reflux disease)   . High cholesterol   . History of chicken pox    childhood  . Preventative health care 10/23/2013  . Tremor of right hand  04/17/2014   Past Surgical History:  Procedure Laterality Date  . COLONOSCOPY    . ESOPHAGEAL DILATION  2005   [facility]  . HERNIA REPAIR     right groin  . POLYPECTOMY     Family History  Problem Relation Age of Onset  . Hyperlipidemia Mother   . Hypertension Mother        mother-father  . Pulmonary fibrosis Mother   . Diabetes Father   . Heart disease Father        congestive heart failure  . Other Father        brain tumor  . Mental illness Sister        depression  . GER disease Son   . Stroke Maternal Grandmother   . Diabetes Maternal Grandmother   . Diabetes Other        mother -father  . Prostate cancer Neg Hx   . Breast cancer Neg Hx   . Colon cancer Neg Hx    Social History   Socioeconomic History  . Marital status: Married    Spouse name: Not on file  . Number of children: Not on file  . Years of education: Not on file  . Highest education level: Not  on file  Occupational History  . Not on file  Tobacco Use  . Smoking status: Former Smoker    Packs/day: 1.00    Years: 22.00    Pack years: 22.00    Types: Cigarettes    Start date: 02/05/1970    Quit date: 03/07/1992    Years since quitting: 27.2  . Smokeless tobacco: Never Used  . Tobacco comment: quit 21 years ago  Substance and Sexual Activity  . Alcohol use: Yes    Alcohol/week: 6.0 standard drinks    Types: 6 Cans of beer per week    Comment: social  . Drug use: No  . Sexual activity: Yes    Comment: lives with wife, no dietary restirctions  Other Topics Concern  . Not on file  Social History Narrative  . Not on file   Social Determinants of Health   Financial Resource Strain:   . Difficulty of Paying Living Expenses:   Food Insecurity:   . Worried About Charity fundraiser in the Last Year:   . Arboriculturist in the Last Year:   Transportation Needs:   . Film/video editor (Medical):   Marland Kitchen Lack of Transportation (Non-Medical):   Physical Activity:   . Days of Exercise per  Week:   . Minutes of Exercise per Session:   Stress:   . Feeling of Stress :   Social Connections:   . Frequency of Communication with Friends and Family:   . Frequency of Social Gatherings with Friends and Family:   . Attends Religious Services:   . Active Member of Clubs or Organizations:   . Attends Archivist Meetings:   Marland Kitchen Marital Status:     Outpatient Encounter Medications as of 06/15/2019  Medication Sig  . cholecalciferol (VITAMIN D) 1000 UNITS tablet Take 1,000 Units by mouth daily. 2 tablets daily  . Coenzyme Q10 (CO Q 10 PO) Take by mouth every morning.   Marland Kitchen esomeprazole (NEXIUM) 20 MG capsule Take 1 capsule (20 mg total) by mouth daily.  . fluticasone (FLONASE) 50 MCG/ACT nasal spray 1 spray each nostril following sinus rinses twice daily  . GLUCOSAMINE HCL PO Take by mouth every morning.   Nyoka Cowden Tea, Camillia sinensis, (GREEN TEA PO) Take 315 mg by mouth every morning.   . lactobacillus acidophilus (BACID) TABS tablet Take 1 tablet by mouth daily.   Marland Kitchen losartan (COZAAR) 50 MG tablet Take 1 tablet (50 mg total) by mouth daily.  . Multiple Vitamin (MULTI-VITAMIN PO) Take by mouth every morning.   . mupirocin ointment (BACTROBAN) 2 % Apply to affected area TID for 7 days.  Marland Kitchen NIACIN CR PO Take 500 mg by mouth daily.   . Omega-3 Fatty Acids (FISH OIL CONCENTRATE PO) Take by mouth every morning.   . psyllium (METAMUCIL) 58.6 % powder Take 1 packet by mouth 2 (two) times daily.   . rosuvastatin (CRESTOR) 40 MG tablet Take 1 tablet (40 mg total) by mouth daily.  . Zinc 30 MG TABS Take by mouth every morning.  . SEB-PREV WASH 10 % LIQD    No facility-administered encounter medications on file as of 06/15/2019.    Activities of Daily Living In your present state of health, do you have any difficulty performing the following activities: 06/15/2019 04/13/2019  Hearing? N N  Vision? N N  Difficulty concentrating or making decisions? N N  Walking or climbing stairs? N N   Dressing or bathing? N N  Doing  errands, shopping? N N  Some recent data might be hidden    Patient Care Team: Mellody Dance, DO as PCP - General (Family Medicine) [place], Jennefer Bravo, MD as Referring Physician (Dermatology) Volanda Napoleon, MD as Consulting Physician (Oncology) Mansouraty, Telford Nab., MD as Consulting Physician (Gastroenterology)   Assessment:   This is a routine wellness examination for [place].  Exercise Activities and Dietary recommendations    Goals   None     Fall Risk Fall Risk  06/15/2019 04/13/2019 03/12/2018 09/09/2017 06/11/2017  Falls in the past year? 0 0 0 No No  Number falls in past yr: 0 0 - - -  Injury with Fall? 0 0 - - -  Follow up Falls evaluation completed Falls evaluation completed Falls evaluation completed - -   Is the patient's home free of loose throw rugs in walkways, pet beds, electrical cords, etc?   yes      Grab bars in the bathroom? no      Handrails on the stairs?   no      Adequate lighting?   yes  Timed Get Up and Go Performed:   Depression Screen PHQ 2/9 Scores 06/15/2019 04/13/2019 09/22/2018 08/10/2018  PHQ - 2 Score 0 0 0 0  PHQ- 9 Score 0 0 0 0    Cognitive Function     6CIT Screen 06/15/2019  What Year? 0 points  What month? 0 points  What time? 0 points  Count back from 20 0 points  Months in reverse 0 points  Repeat phrase 0 points  Total Score 0    Immunization History  Administered Date(s) Administered  . Fluad Quad(high Dose 65+) 01/14/2019  . Influenza Whole 01/22/2013  . Influenza, High Dose Seasonal PF 01/09/2017, 01/06/2018  . Influenza,inj,Quad PF,6+ Mos 12/26/2015  . Influenza-Unspecified 12/09/2010, 12/27/2013, 12/29/2014  . Pneumococcal Conjugate-13 03/08/2015  . Pneumococcal-Unspecified 02/12/2010  . Tdap 02/13/2011  . Zoster Recombinat (Shingrix) 03/15/2018, 05/20/2018    Qualifies for Shingles Vaccine?UTD Pneumococcal: UTD Tdap: UTD  Screening Tests Health Maintenance   Topic Date Due  . COLONOSCOPY  09/22/2019 (Originally 09/08/2018)  . TETANUS/TDAP  02/12/2021  . INFLUENZA VACCINE  Completed  . Hepatitis C Screening  Completed  . PNA vac Low Risk Adult  Discontinued   Cancer Screenings: Lung: Low Dose CT Chest recommended if Age 30-80 years, 30 pack-year currently smoking OR have quit w/in 15years. Patient does not qualify. Colorectal: 09/07/2013  Additional Screenings:  Hepatitis C Screening:12182018 HIV Screening: 03/12/2018      Plan:     1. Encounter for Medicare annual wellness exam - Reviewed need for immunizations and screenings according to recommendations and guidelines.   - Last colonoscopy obtained 09/07/2013. - Patient believes he was directed to repeat in five years.   - Ambulatory referral to gastroenterology provided today.   - Discussed need for AAA screen. - Per patient, was a former smoker long ago, and went to [place] in the past. - Last AAA ultrasound screening obtained 03/13/2017, with repeat in 5 years.   - Shingles vaccination up to date.   - Flu shot UTD, received fall of 2020.   - Education and counseling provided today regarding COVID-19 and COVID-19 vaccination recommendations. Lengthy conversation held with patient and all questions answered. - Advised patient to avoid taking any vaccines within 45 days of receiving the COVID-19 vaccine.   - Fasting lab work obtained today.   - Encouraged patient to continue to engage in regular exercise, ideally  to a goal of 150-300 minutes of moderate cardiovascular activity per week. Extensive health counseling and education provided today. All questions answered.   2. Essential hypertension - CBC - Comprehensive metabolic panel - TSH - T4, free  3. Hyperlipidemia, mixed - CBC - Comprehensive metabolic panel - Lipid panel  4. Mildly Elevated AST (SGOT) - Comprehensive metabolic panel - Lipid panel   5. Vitamin D insufficiency - VITAMIN D 25 Hydroxy  (Vit-D Deficiency, Fractures)  6. Screening for colon cancer - Ambulatory referral to Gastroenterology   I have personally reviewed and noted the following in the patient's chart:   . Medical and social history . Current medications and supplements . Functional ability and status . Nutritional status . Physical activity . List of other physicians . Vitals . Screenings to include cognitive, depression, and falls . Referrals and appointments  In addition, I have reviewed and discussed with patient certain preventive protocols, quality metrics, and best practice recommendations. A written personalized care plan for preventive services as well as general preventive health recommendations were provided to patient.

## 2019-06-16 ENCOUNTER — Telehealth: Payer: Self-pay | Admitting: Physician Assistant

## 2019-06-16 LAB — COMPREHENSIVE METABOLIC PANEL
ALT: 28 IU/L (ref 0–44)
AST: 50 IU/L — ABNORMAL HIGH (ref 0–40)
Albumin/Globulin Ratio: 2 (ref 1.2–2.2)
Albumin: 4.5 g/dL (ref 3.8–4.8)
Alkaline Phosphatase: 72 IU/L (ref 39–117)
BUN/Creatinine Ratio: 12 (ref 10–24)
BUN: 9 mg/dL (ref 8–27)
Bilirubin Total: 0.3 mg/dL (ref 0.0–1.2)
CO2: 25 mmol/L (ref 20–29)
Calcium: 8.6 mg/dL (ref 8.6–10.2)
Chloride: 98 mmol/L (ref 96–106)
Creatinine, Ser: 0.75 mg/dL — ABNORMAL LOW (ref 0.76–1.27)
GFR calc Af Amer: 110 mL/min/{1.73_m2} (ref >59)
GFR calc non Af Amer: 95 mL/min/{1.73_m2} (ref >59)
Globulin, Total: 2.2 g/dL (ref 1.5–4.5)
Glucose: 74 mg/dL (ref 65–99)
Potassium: 3.9 mmol/L (ref 3.5–5.2)
Sodium: 140 mmol/L (ref 134–144)
Total Protein: 6.7 g/dL (ref 6.0–8.5)

## 2019-06-16 LAB — T4, FREE: Free T4: 0.78 ng/dL — ABNORMAL LOW (ref 0.82–1.77)

## 2019-06-16 LAB — VITAMIN D 25 HYDROXY (VIT D DEFICIENCY, FRACTURES): Vit D, 25-Hydroxy: 61.2 ng/mL (ref 30.0–100.0)

## 2019-06-16 LAB — CBC
Hematocrit: 40.8 % (ref 37.5–51.0)
Hemoglobin: 13.9 g/dL (ref 13.0–17.7)
MCH: 32.3 pg (ref 26.6–33.0)
MCHC: 34.1 g/dL (ref 31.5–35.7)
MCV: 95 fL (ref 79–97)
Platelets: 242 10*3/uL (ref 150–450)
RBC: 4.31 x10E6/uL (ref 4.14–5.80)
RDW: 12.8 % (ref 11.6–15.4)
WBC: 50.2 10*3/uL (ref 3.4–10.8)

## 2019-06-16 LAB — LIPID PANEL
Chol/HDL Ratio: 3 ratio (ref 0.0–5.0)
Cholesterol, Total: 203 mg/dL — ABNORMAL HIGH (ref 100–199)
HDL: 67 mg/dL (ref >39)
LDL Chol Calc (NIH): 109 mg/dL — ABNORMAL HIGH (ref 0–99)
Triglycerides: 159 mg/dL — ABNORMAL HIGH (ref 0–149)
VLDL Cholesterol Cal: 27 mg/dL (ref 5–40)

## 2019-06-16 LAB — HEMOGLOBIN A1C
Est. average glucose Bld gHb Est-mCnc: 100 mg/dL
Hgb A1c MFr Bld: 5.1 % (ref 4.8–5.6)

## 2019-06-16 LAB — TSH: TSH: 1.94 u[IU]/mL (ref 0.450–4.500)

## 2019-06-16 NOTE — Telephone Encounter (Signed)
[facility] contacted me at 12:30 with critical lab of WBC on 50.2. pt has known CLL. Did not conduct any follow up with patient.

## 2019-08-15 ENCOUNTER — Encounter: Payer: Self-pay | Admitting: Family Medicine

## 2019-08-23 MED FILL — ESOMEPRAZOLE MAG DR 20 MG C: 20 | 90 days supply | Qty: 90 | Fill #1

## 2019-08-23 MED FILL — LOSARTAN POTASSIUM 50 MG TA: 50 | 90 days supply | Qty: 90 | Fill #1

## 2019-08-23 MED FILL — ROSUVASTATIN CALCIUM 40 MG: 40 | 90 days supply | Qty: 90 | Fill #1

## 2019-09-20 ENCOUNTER — Other Ambulatory Visit: Payer: Self-pay | Admitting: *Deleted

## 2019-09-20 DIAGNOSIS — C9110 Chronic lymphocytic leukemia of B-cell type not having achieved remission: Secondary | ICD-10-CM

## 2019-09-21 ENCOUNTER — Inpatient Hospital Stay: Payer: PPO | Attending: Hematology & Oncology | Admitting: Hematology & Oncology

## 2019-09-21 ENCOUNTER — Other Ambulatory Visit: Payer: Self-pay

## 2019-09-21 ENCOUNTER — Inpatient Hospital Stay: Payer: PPO

## 2019-09-21 ENCOUNTER — Telehealth: Payer: Self-pay | Admitting: Hematology & Oncology

## 2019-09-21 ENCOUNTER — Encounter: Payer: Self-pay | Admitting: Hematology & Oncology

## 2019-09-21 VITALS — BP 150/99 | HR 76 | Temp 98.2°F | Resp 18 | Wt 154.5 lb

## 2019-09-21 DIAGNOSIS — Z79899 Other long term (current) drug therapy: Secondary | ICD-10-CM | POA: Diagnosis not present

## 2019-09-21 DIAGNOSIS — C9110 Chronic lymphocytic leukemia of B-cell type not having achieved remission: Secondary | ICD-10-CM

## 2019-09-21 LAB — LACTATE DEHYDROGENASE: LDH: 107 U/L (ref 98–192)

## 2019-09-21 LAB — CMP (CANCER CENTER ONLY)
ALT: 27 U/L (ref 0–44)
AST: 42 U/L — ABNORMAL HIGH (ref 15–41)
Albumin: 4.8 g/dL (ref 3.5–5.0)
Alkaline Phosphatase: 72 U/L (ref 38–126)
Anion gap: 10 (ref 5–15)
BUN: 7 mg/dL — ABNORMAL LOW (ref 8–23)
CO2: 29 mmol/L (ref 22–32)
Calcium: 10.1 mg/dL (ref 8.9–10.3)
Chloride: 100 mmol/L (ref 98–111)
Creatinine: 0.86 mg/dL (ref 0.61–1.24)
GFR, Est AFR Am: >60 mL/min (ref >60)
GFR, Estimated: >60 mL/min (ref >60)
Glucose, Bld: 106 mg/dL — ABNORMAL HIGH (ref 70–99)
Potassium: 4.6 mmol/L (ref 3.5–5.1)
Sodium: 139 mmol/L (ref 135–145)
Total Bilirubin: 0.9 mg/dL (ref 0.3–1.2)
Total Protein: 7.6 g/dL (ref 6.5–8.1)

## 2019-09-21 LAB — CBC WITH DIFFERENTIAL (CANCER CENTER ONLY)
Abs Immature Granulocytes: 0.08 10*3/uL — ABNORMAL HIGH (ref 0.00–0.07)
Basophils Absolute: 0 10*3/uL (ref 0.0–0.1)
Basophils Relative: 0 %
Eosinophils Absolute: 0 10*3/uL (ref 0.0–0.5)
Eosinophils Relative: 0 %
HCT: 43.7 % (ref 39.0–52.0)
Hemoglobin: 14.2 g/dL (ref 13.0–17.0)
Immature Granulocytes: 0 %
Lymphocytes Relative: 85 %
Lymphs Abs: 40 10*3/uL — ABNORMAL HIGH (ref 0.7–4.0)
MCH: 31.5 pg (ref 26.0–34.0)
MCHC: 32.5 g/dL (ref 30.0–36.0)
MCV: 96.9 fL (ref 80.0–100.0)
Monocytes Absolute: 1.3 10*3/uL — ABNORMAL HIGH (ref 0.1–1.0)
Monocytes Relative: 3 %
Neutro Abs: 5.4 10*3/uL (ref 1.7–7.7)
Neutrophils Relative %: 12 %
Platelet Count: 229 10*3/uL (ref 150–400)
RBC: 4.51 MIL/uL (ref 4.22–5.81)
RDW: 12.5 % (ref 11.5–15.5)
WBC Count: 46.8 10*3/uL — ABNORMAL HIGH (ref 4.0–10.5)
nRBC: 0 % (ref 0.0–0.2)

## 2019-09-21 NOTE — Telephone Encounter (Signed)
Appointments scheduled calendar printed per 6/30 los

## 2019-09-21 NOTE — Progress Notes (Signed)
Hematology and Oncology Follow Up Visit  George [name] [id number] October 06, 1950 69 y.o. 09/21/2019   Principle Diagnosis:   CLL-stage A  Current Therapy:    Observation     Interim History:  Mr.  George [name] is back for followup. He has had a very good year so far. He has not had the coronavirus. He is also waiting on the vaccination.  He and his son went skydiving recently. They had a wonderful time doing this.  He and his wife had gone camping a couple times. They are truly outdoor people. Maybe, in the fall they will head out west to [place].  He has had no problems with fever. He has had no problems with nausea or vomiting. Has not noted any swollen lymph nodes.  He feels a little bit "jittery" today. Blood pressure is on the higher side. He sees his family doctor for this.  There is no problems with bowels or bladder. He has had no bleeding. He has had no leg swelling. His right ankle maybe gets a little swollen on occasion. He said he sprained this in the past.  Overall, his performance status is ECOG 0.  Medications:  Current Outpatient Medications:  .  cholecalciferol (VITAMIN D) 1000 UNITS tablet, Take 1,000 Units by mouth daily. 2 tablets daily, Disp: , Rfl:  .  Coenzyme Q10 (CO Q 10 PO), Take by mouth every morning. , Disp: , Rfl:  .  esomeprazole (NEXIUM) 20 MG capsule, Take 1 capsule (20 mg total) by mouth daily., Disp: 90 capsule, Rfl: 1 .  fluticasone (FLONASE) 50 MCG/ACT nasal spray, 1 spray each nostril following sinus rinses twice daily, Disp: 16 g, Rfl: 2 .  GLUCOSAMINE HCL PO, Take by mouth every morning. , Disp: , Rfl:  .  Green Tea, Camillia sinensis, (GREEN TEA PO), Take 315 mg by mouth every morning. , Disp: , Rfl:  .  lactobacillus acidophilus (BACID) TABS tablet, Take 1 tablet by mouth daily. , Disp: , Rfl:  .  losartan (COZAAR) 50 MG tablet, Take 1 tablet (50 mg total) by mouth daily., Disp: 90 tablet, Rfl: 1 .  Multiple Vitamin (MULTI-VITAMIN PO), Take by  mouth every morning. , Disp: , Rfl:  .  mupirocin ointment (BACTROBAN) 2 %, Apply to affected area TID for 7 days., Disp: 30 g, Rfl: 3 .  NIACIN CR PO, Take 500 mg by mouth daily. , Disp: , Rfl:  .  Omega-3 Fatty Acids (FISH OIL CONCENTRATE PO), Take by mouth every morning. , Disp: , Rfl:  .  psyllium (METAMUCIL) 58.6 % powder, Take 1 packet by mouth 2 (two) times daily. , Disp: , Rfl:  .  rosuvastatin (CRESTOR) 40 MG tablet, Take 1 tablet (40 mg total) by mouth daily., Disp: 90 tablet, Rfl: 1 .  SEB-PREV WASH 10 % LIQD, , Disp: , Rfl: 3 .  Zinc 30 MG TABS, Take by mouth every morning., Disp: , Rfl:   Allergies:  Allergies  Allergen Reactions  . Doxycycline     Sore throat, red tongue, red skin    Past Medical History, Surgical history, Social history, and Family History were reviewed and updated.  Review of Systems: Review of Systems  Constitutional: Negative.   HENT: Negative.   Eyes: Negative.   Respiratory: Negative.   Cardiovascular: Negative.   Gastrointestinal: Negative.   Genitourinary: Negative.   Musculoskeletal: Negative.   Skin: Negative.   Neurological: Negative.   Endo/Heme/Allergies: Negative.   Psychiatric/Behavioral: Negative.  Physical Exam:  weight is 154 lb 8 oz (70.1 kg). His oral temperature is 98.2 F (36.8 C). His blood pressure is 150/99 (abnormal) and his pulse is 76. His respiration is 18 and oxygen saturation is 100%.   Physical Exam Vitals reviewed.  HENT:     Head: Normocephalic and atraumatic.  Eyes:     Pupils: Pupils are equal, round, and reactive to light.  Cardiovascular:     Rate and Rhythm: Normal rate and regular rhythm.     Heart sounds: Normal heart sounds.  Pulmonary:     Effort: Pulmonary effort is normal.     Breath sounds: Normal breath sounds.  Abdominal:     General: Bowel sounds are normal.     Palpations: Abdomen is soft.  Musculoskeletal:        General: No tenderness or deformity. Normal range of motion.      Cervical back: Normal range of motion.  Lymphadenopathy:     Cervical: No cervical adenopathy.  Skin:    General: Skin is warm and dry.     Findings: No erythema or rash.  Neurological:     Mental Status: He is alert and oriented to person, place, and time.  Psychiatric:        Behavior: Behavior normal.        Thought Content: Thought content normal.        Judgment: Judgment normal.      Lab Results  Component Value Date   WBC 46.8 (H) 09/21/2019   HGB 14.2 09/21/2019   HCT 43.7 09/21/2019   MCV 96.9 09/21/2019   PLT 229 09/21/2019     Chemistry      Component Value Date/Time   NA 139 09/21/2019 0804   NA 140 06/15/2019 0859   K 4.6 09/21/2019 0804   CL 100 09/21/2019 0804   CO2 29 09/21/2019 0804   BUN 7 (L) 09/21/2019 0804   BUN 9 06/15/2019 0859   CREATININE 0.86 09/21/2019 0804   CREATININE 0.73 10/15/2015 0909      Component Value Date/Time   CALCIUM 10.1 09/21/2019 0804   ALKPHOS 72 09/21/2019 0804   AST 42 (H) 09/21/2019 0804   ALT 27 09/21/2019 0804   BILITOT 0.9 09/21/2019 0804      Impression and Plan: George [name] is a 69 year old gentleman. He has CLL.  It does look like his disease might be more active now.  We have been seeing him for about 13 years.   For right now, I am happy that his white cell count is essentially stable. He is not thrombocytopenic. He has not anemic. Everything really looks great.  I looked at his blood smear under the microscope. He does have increased white blood cells. There appear to be mature lymphocytes.  We will still plan for follow-up yearly. I still do not see a reason to treat him.    Volanda Napoleon, MD 6/30/20218:35 AM

## 2019-09-28 ENCOUNTER — Other Ambulatory Visit: Payer: Self-pay

## 2019-09-28 ENCOUNTER — Ambulatory Visit (INDEPENDENT_AMBULATORY_CARE_PROVIDER_SITE_OTHER): Payer: PPO | Admitting: Physician Assistant

## 2019-09-28 ENCOUNTER — Encounter: Payer: Self-pay | Admitting: Physician Assistant

## 2019-09-28 VITALS — BP 138/84 | HR 81 | Temp 97.8°F | Ht 68.0 in | Wt 158.5 lb

## 2019-09-28 DIAGNOSIS — C9110 Chronic lymphocytic leukemia of B-cell type not having achieved remission: Secondary | ICD-10-CM

## 2019-09-28 DIAGNOSIS — I10 Essential (primary) hypertension: Secondary | ICD-10-CM

## 2019-09-28 DIAGNOSIS — E782 Mixed hyperlipidemia: Secondary | ICD-10-CM

## 2019-09-28 NOTE — Assessment & Plan Note (Signed)
-   BP today is 138/84 HR 81, at goal. - Continue Losartan 50 mg -Discussed with patient the possibility of whitecoat syndrome so advised to continue ambulatory BP and pulse monitoring. Instructed to notify the clinic if BP consistently above 140/90 or less than 90/60.   - Continue DASH diet. -Stay well hydrated, at least 64 fl oz -Continue moderate to vigorous physical activity. -Will continue to monitor.

## 2019-09-28 NOTE — Assessment & Plan Note (Addendum)
-   Last lipid panel: Total cholesterol, triglycerides, and LDL elevated - Continue rosuvastatin 40 mg - Continue heart healthy diet. -Plan to recheck lipid panel and hepatic function at next office visit in 4 months.

## 2019-09-28 NOTE — Assessment & Plan Note (Signed)
-  Followed by oncology. -Stable, most recent CBC: WBC 46.8

## 2019-09-28 NOTE — Progress Notes (Signed)
Established Patient Office Visit  Subjective:  Patient ID: George [name], male    DOB: [birth year]  Age: 69 y.o. MRN: [id number]  CC:  Chief Complaint  Patient presents with  . Hypertension    HPI George [name] presents for follow-up on hypertension.   HTN: Pt denies chest pain, palpitations, dizziness or lower extremity swelling. Taking medication as directed without side effects. Checks BP at home and readings range in low 100s/64-70s. States  his BP machine is a couple years old and might be broken, so he plans to buy a new one. His blood pressure was 150/99 at his recent visit with oncology. Reports he does get a little nervous when going to doctor visits. Pt follows a low salt diet. He is very active with hiking and biking.  HLD: Pt taking medication as directed without issues. Denies side effects including myalgias and RUQ pain. States his diet consists of lean meats and limit fried foods.  CLL: Followed by oncology-Dr. Burney Gauze. Will see him again in 1 year.     Past Medical History:  Diagnosis Date  . 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 Colon polyp   . Elevated BP 04/23/2014  . GERD (gastroesophageal reflux disease)   . High cholesterol   . History of chicken pox    childhood  . Preventative health care 10/23/2013  . Tremor of right hand 04/17/2014    Past Surgical History:  Procedure Laterality Date  . COLONOSCOPY    . ESOPHAGEAL DILATION  2005   [facility]  . HERNIA REPAIR     right groin  . POLYPECTOMY      Family History  Problem Relation Age of Onset  . Hyperlipidemia Mother   . Hypertension Mother        mother-father  . Pulmonary fibrosis Mother   . Diabetes Father   . Heart disease Father        congestive heart failure  . Other Father        brain tumor  . Mental illness Sister        depression  . GER disease Son   . Stroke Maternal Grandmother   . Diabetes Maternal Grandmother   . Diabetes Other        mother -father   . Prostate cancer Neg Hx   . Breast cancer Neg Hx   . Colon cancer Neg Hx     Social History   Socioeconomic History  . Marital status: Married    Spouse name: Not on file  . Number of children: Not on file  . Years of education: Not on file  . Highest education level: Not on file  Occupational History  . Not on file  Tobacco Use  . Smoking status: Former Smoker    Packs/day: 1.00    Years: 22.00    Pack years: 22.00    Types: Cigarettes    Start date: 02/05/1970    Quit date: 03/07/1992    Years since quitting: 27.5  . Smokeless tobacco: Never Used  . Tobacco comment: quit 21 years ago  Vaping Use  . Vaping Use: Never used  Substance and Sexual Activity  . Alcohol use: Yes    Alcohol/week: 6.0 standard drinks    Types: 6 Cans of beer per week    Comment: social  . Drug use: No  . Sexual activity: Yes    Comment: lives with wife, no dietary restirctions  Other Topics Concern  .  Not on file  Social History Narrative  . Not on file   Social Determinants of Health   Financial Resource Strain:   . Difficulty of Paying Living Expenses:   Food Insecurity:   . Worried About Charity fundraiser in the Last Year:   . Arboriculturist in the Last Year:   Transportation Needs:   . Film/video editor (Medical):   Marland Kitchen Lack of Transportation (Non-Medical):   Physical Activity:   . Days of Exercise per Week:   . Minutes of Exercise per Session:   Stress:   . Feeling of Stress :   Social Connections:   . Frequency of Communication with Friends and Family:   . Frequency of Social Gatherings with Friends and Family:   . Attends Religious Services:   . Active Member of Clubs or Organizations:   . Attends Archivist Meetings:   Marland Kitchen Marital Status:   Intimate Partner Violence:   . Fear of Current or Ex-Partner:   . Emotionally Abused:   Marland Kitchen Physically Abused:   . Sexually Abused:     Outpatient Medications Prior to Visit  Medication Sig Dispense Refill  .  cholecalciferol (VITAMIN D) 1000 UNITS tablet Take 1,000 Units by mouth daily. 2 tablets daily    . Coenzyme Q10 (CO Q 10 PO) Take by mouth every morning.     Marland Kitchen esomeprazole (NEXIUM) 20 MG capsule Take 1 capsule (20 mg total) by mouth daily. 90 capsule 1  . fluticasone (FLONASE) 50 MCG/ACT nasal spray 1 spray each nostril following sinus rinses twice daily 16 g 2  . GLUCOSAMINE HCL PO Take by mouth every morning.     Nyoka Cowden Tea, Camillia sinensis, (GREEN TEA PO) Take 315 mg by mouth every morning.     . lactobacillus acidophilus (BACID) TABS tablet Take 1 tablet by mouth daily.     Marland Kitchen losartan (COZAAR) 50 MG tablet Take 1 tablet (50 mg total) by mouth daily. 90 tablet 1  . Multiple Vitamin (MULTI-VITAMIN PO) Take by mouth every morning.     . mupirocin ointment (BACTROBAN) 2 % Apply to affected area TID for 7 days. 30 g 3  . NIACIN CR PO Take 500 mg by mouth daily.     . Omega-3 Fatty Acids (FISH OIL CONCENTRATE PO) Take by mouth every morning.     . psyllium (METAMUCIL) 58.6 % powder Take 1 packet by mouth 2 (two) times daily.     . rosuvastatin (CRESTOR) 40 MG tablet Take 1 tablet (40 mg total) by mouth daily. 90 tablet 1  . Zinc 30 MG TABS Take by mouth every morning.    . SEB-PREV [place] 10 % LIQD  (Patient not taking: Reported on 09/28/2019)  3   No facility-administered medications prior to visit.    Allergies  Allergen Reactions  . Doxycycline     Sore throat, red tongue, red skin    ROS Review of Systems  General:   Denies fever, chills, unexplained weight loss.  Optho/Auditory:   Denies visual changes, blurred vision/LOV Respiratory:   Denies SOB, DOE more than baseline levels.  Cardiovascular:   Denies chest pain, palpitations, new onset peripheral edema  Gastrointestinal:   Denies nausea, vomiting, diarrhea.  Genitourinary: Denies dysuria, freq/ urgency, flank pain Endocrine:     Denies hot or cold intolerance, polyuria, polydipsia. Musculoskeletal:   Denies unexplained  myalgias, unexplained arthralgias, gait problems.  Skin:  Denies rash, suspicious lesions Neurological:  Denies dizziness, unexplained weakness, numbness  Psychiatric/Behavioral:   Denies mood changes, suicidal or homicidal ideations, hallucinations    Objective:    Physical Exam General:  Well Developed, well nourished, appropriate for stated age.  Neuro:  Alert and oriented,  extra-ocular muscles intact  HEENT:  Normocephalic, atraumatic, neck supple Skin:  no gross rash, warm, pink. Cardiac:  RRR, S1 S2 Respiratory:  ECTA B/L, Not using accessory muscles, speaking in full sentences- unlabored. Vascular:  Ext warm, no cyanosis apprec.; cap RF less 2 sec., no edema  Psych:  No HI/SI, judgement and insight good, Euthymic mood. Full Affect.   BP 138/84   Pulse 81   Temp 97.8 F (36.6 C) (Oral)   Ht 5\' 8"  (1.727 m)   Wt 158 lb 8 oz (71.9 kg)   SpO2 97%   BMI 24.10 kg/m  Wt Readings from Last 3 Encounters:  09/28/19 158 lb 8 oz (71.9 kg)  09/21/19 154 lb 8 oz (70.1 kg)  06/15/19 173 lb (78.5 kg)     Health Maintenance Due  Topic Date Due  . COVID-19 Vaccine (1) Never done  . COLONOSCOPY  09/08/2018    There are no preventive care reminders to display for this patient.  Lab Results  Component Value Date   TSH 1.940 06/15/2019   Lab Results  Component Value Date   WBC 46.8 (H) 09/21/2019   HGB 14.2 09/21/2019   HCT 43.7 09/21/2019   MCV 96.9 09/21/2019   PLT 229 09/21/2019   Lab Results  Component Value Date   NA 139 09/21/2019   K 4.6 09/21/2019   CO2 29 09/21/2019   GLUCOSE 106 (H) 09/21/2019   BUN 7 (L) 09/21/2019   CREATININE 0.86 09/21/2019   BILITOT 0.9 09/21/2019   ALKPHOS 72 09/21/2019   AST 42 (H) 09/21/2019   ALT 27 09/21/2019   PROT 7.6 09/21/2019   ALBUMIN 4.8 09/21/2019   CALCIUM 10.1 09/21/2019   ANIONGAP 10 09/21/2019   GFR 113.38 12/11/2014   Lab Results  Component Value Date   CHOL 203 (H) 06/15/2019   Lab Results  Component  Value Date   HDL 67 06/15/2019   Lab Results  Component Value Date   LDLCALC 109 (H) 06/15/2019   Lab Results  Component Value Date   TRIG 159 (H) 06/15/2019   Lab Results  Component Value Date   CHOLHDL 3.0 06/15/2019   Lab Results  Component Value Date   HGBA1C 5.1 06/15/2019      Assessment & Plan:   Problem List Items Addressed This Visit      Cardiovascular and Mediastinum   Essential hypertension - Primary (Chronic)    - BP today is 138/84 HR 81, at goal. - Continue Losartan 50 mg -Discussed with patient the possibility of whitecoat syndrome so advised to continue ambulatory BP and pulse monitoring. Instructed to notify the clinic if BP consistently above 140/90 or less than 90/60.   - Continue DASH diet. -Stay well hydrated, at least 64 fl oz -Continue moderate to vigorous physical activity. -Will continue to monitor.         Other   CLL (chronic lymphocytic leukemia) ([facility]) (Chronic)    -Followed by oncology. -Stable, most recent CBC: WBC 46.8       Hyperlipidemia, mixed (Chronic)    - Last lipid panel: Total cholesterol, triglycerides, and LDL elevated - Continue rosuvastatin 40 mg - Continue heart healthy diet. -Plan to recheck lipid panel and hepatic function at next  office visit in 4 months.          No orders of the defined types were placed in this encounter.   Follow-up: Return in about 4 months (around 01/29/2020) for HTN, HLD, CLL and FBW (lipid panel, cbc, cmp, cbc) few days prior to OV.    Lorrene Reid, PA-C

## 2019-09-28 NOTE — Patient Instructions (Signed)

## 2019-10-14 ENCOUNTER — Ambulatory Visit: Payer: PPO | Admitting: Physician Assistant

## 2019-12-02 ENCOUNTER — Other Ambulatory Visit: Payer: Self-pay | Admitting: Family Medicine

## 2019-12-02 ENCOUNTER — Other Ambulatory Visit: Payer: Self-pay | Admitting: Physician Assistant

## 2019-12-02 DIAGNOSIS — K219 Gastro-esophageal reflux disease without esophagitis: Secondary | ICD-10-CM

## 2019-12-02 DIAGNOSIS — I10 Essential (primary) hypertension: Secondary | ICD-10-CM

## 2019-12-02 DIAGNOSIS — E782 Mixed hyperlipidemia: Secondary | ICD-10-CM

## 2019-12-02 MED FILL — LOSARTAN POTASSIUM 50 MG TA: 50 | 90 days supply | Qty: 90 | Fill #0

## 2019-12-02 MED FILL — ROSUVASTATIN CALCIUM 40 MG: 40 | 90 days supply | Qty: 90 | Fill #0

## 2019-12-02 MED FILL — ESOMEPRAZOLE MAG DR 20 MG C: 20 | 90 days supply | Qty: 90 | Fill #0

## 2020-01-30 ENCOUNTER — Other Ambulatory Visit: Payer: Self-pay | Admitting: Physician Assistant

## 2020-01-30 DIAGNOSIS — E782 Mixed hyperlipidemia: Secondary | ICD-10-CM

## 2020-01-30 DIAGNOSIS — I10 Essential (primary) hypertension: Secondary | ICD-10-CM

## 2020-01-30 DIAGNOSIS — Z0000 Encounter for general adult medical examination without abnormal findings: Secondary | ICD-10-CM

## 2020-01-31 ENCOUNTER — Encounter: Payer: Self-pay | Admitting: Nurse Practitioner

## 2020-01-31 ENCOUNTER — Other Ambulatory Visit: Payer: Self-pay

## 2020-01-31 ENCOUNTER — Other Ambulatory Visit: Payer: PPO

## 2020-01-31 DIAGNOSIS — Z0000 Encounter for general adult medical examination without abnormal findings: Secondary | ICD-10-CM

## 2020-01-31 DIAGNOSIS — E782 Mixed hyperlipidemia: Secondary | ICD-10-CM | POA: Diagnosis not present

## 2020-01-31 DIAGNOSIS — I10 Essential (primary) hypertension: Secondary | ICD-10-CM | POA: Diagnosis not present

## 2020-02-01 LAB — COMPREHENSIVE METABOLIC PANEL
ALT: 21 IU/L (ref 0–44)
AST: 50 IU/L — ABNORMAL HIGH (ref 0–40)
Albumin/Globulin Ratio: 2.2 (ref 1.2–2.2)
Albumin: 4.9 g/dL — ABNORMAL HIGH (ref 3.8–4.8)
Alkaline Phosphatase: 79 IU/L (ref 44–121)
BUN/Creatinine Ratio: 10 (ref 10–24)
BUN: 8 mg/dL (ref 8–27)
Bilirubin Total: 0.5 mg/dL (ref 0.0–1.2)
CO2: 23 mmol/L (ref 20–29)
Calcium: 9.7 mg/dL (ref 8.6–10.2)
Chloride: 107 mmol/L — ABNORMAL HIGH (ref 96–106)
Creatinine, Ser: 0.81 mg/dL (ref 0.76–1.27)
GFR calc Af Amer: 106 mL/min/{1.73_m2} (ref >59)
GFR calc non Af Amer: 91 mL/min/{1.73_m2} (ref >59)
Globulin, Total: 2.2 g/dL (ref 1.5–4.5)
Glucose: 79 mg/dL (ref 65–99)
Potassium: 3.9 mmol/L (ref 3.5–5.2)
Sodium: 127 mmol/L — ABNORMAL LOW (ref 134–144)
Total Protein: 7.1 g/dL (ref 6.0–8.5)

## 2020-02-01 LAB — LIPID PANEL
Chol/HDL Ratio: 2.8 ratio (ref 0.0–5.0)
Cholesterol, Total: 223 mg/dL — ABNORMAL HIGH (ref 100–199)
HDL: 80 mg/dL (ref >39)
LDL Chol Calc (NIH): 132 mg/dL — ABNORMAL HIGH (ref 0–99)
Triglycerides: 65 mg/dL (ref 0–149)
VLDL Cholesterol Cal: 11 mg/dL (ref 5–40)

## 2020-02-01 LAB — CBC
Hematocrit: 40.8 % (ref 37.5–51.0)
Hemoglobin: 13.7 g/dL (ref 13.0–17.7)
MCH: 32 pg (ref 26.6–33.0)
MCHC: 33.6 g/dL (ref 31.5–35.7)
MCV: 95 fL (ref 79–97)
Platelets: 233 10*3/uL (ref 150–450)
RBC: 4.28 x10E6/uL (ref 4.14–5.80)
RDW: 12.6 % (ref 11.6–15.4)
WBC: 51.1 10*3/uL (ref 3.4–10.8)

## 2020-02-02 ENCOUNTER — Encounter: Payer: Self-pay | Admitting: Physician Assistant

## 2020-02-03 ENCOUNTER — Ambulatory Visit: Payer: PPO | Admitting: Physician Assistant

## 2020-02-07 DIAGNOSIS — L821 Other seborrheic keratosis: Secondary | ICD-10-CM | POA: Diagnosis not present

## 2020-02-07 DIAGNOSIS — L578 Other skin changes due to chronic exposure to nonionizing radiation: Secondary | ICD-10-CM | POA: Diagnosis not present

## 2020-02-07 DIAGNOSIS — D225 Melanocytic nevi of trunk: Secondary | ICD-10-CM | POA: Diagnosis not present

## 2020-02-07 DIAGNOSIS — L814 Other melanin hyperpigmentation: Secondary | ICD-10-CM | POA: Diagnosis not present

## 2020-02-07 DIAGNOSIS — L570 Actinic keratosis: Secondary | ICD-10-CM | POA: Diagnosis not present

## 2020-02-20 ENCOUNTER — Ambulatory Visit: Payer: PPO | Admitting: Physician Assistant

## 2020-03-06 MED FILL — LOSARTAN POTASSIUM 50 MG TA: 50 | 90 days supply | Qty: 90 | Fill #1

## 2020-03-06 MED FILL — ROSUVASTATIN CALCIUM 40 MG: 40 | 90 days supply | Qty: 90 | Fill #1

## 2020-03-06 MED FILL — ESOMEPRAZOLE MAGNESIUM 20 M: 20 | 90 days supply | Qty: 90 | Fill #1

## 2020-03-07 ENCOUNTER — Other Ambulatory Visit: Payer: Self-pay

## 2020-03-07 ENCOUNTER — Encounter: Payer: Self-pay | Admitting: Physician Assistant

## 2020-03-07 ENCOUNTER — Ambulatory Visit (INDEPENDENT_AMBULATORY_CARE_PROVIDER_SITE_OTHER): Payer: PPO | Admitting: Physician Assistant

## 2020-03-07 VITALS — BP 122/79 | HR 86 | Ht 68.0 in | Wt 165.1 lb

## 2020-03-07 DIAGNOSIS — I10 Essential (primary) hypertension: Secondary | ICD-10-CM

## 2020-03-07 DIAGNOSIS — E782 Mixed hyperlipidemia: Secondary | ICD-10-CM | POA: Diagnosis not present

## 2020-03-07 DIAGNOSIS — E871 Hypo-osmolality and hyponatremia: Secondary | ICD-10-CM | POA: Diagnosis not present

## 2020-03-07 DIAGNOSIS — J3089 Other allergic rhinitis: Secondary | ICD-10-CM | POA: Diagnosis not present

## 2020-03-07 NOTE — Patient Instructions (Signed)
Hyponatremia Hyponatremia is when the amount of salt (sodium) in your blood is too low. When salt levels are low, your body may take in extra water. This can cause swelling throughout the body. The swelling often affects the brain. What are the causes? This condition may be caused by:  Certain medical problems or conditions.  Vomiting a lot.  Having watery poop (diarrhea) often.  Certain medicines or illegal drugs.  Not having enough water in the body (dehydration).  Drinking too much water.  Eating a diet that is low in salt.  Large burns on your body.  Too much sweating. What increases the risk? You are more likely to get this condition if you:  Have long-term (chronic) kidney disease.  Have heart failure.  Have a medical condition that causes you to have watery poop often.  Do very hard exercises.  Take medicines that affect the amount of salt is in your blood. What are the signs or symptoms? Symptoms of this condition include:  Headache.  Feeling like you may vomit (nausea).  Vomiting.  Being very tired (lethargic).  Muscle weakness and cramps.  Not wanting to eat as much as normal (loss of appetite).  Feeling weak or light-headed. Severe symptoms of this condition include:  Confusion.  Feeling restless (agitation).  Having a fast heart rate.  Passing out (fainting).  Seizures.  Coma. How is this treated? Treatment for this condition depends on the cause. Treatment may include:  Getting fluids through an IV tube that is put into one of your veins.  Taking medicines to fix the salt levels in your blood. If medicines are causing the problem, your medicines will need to be changed.  Limiting how much water or fluid you take in.  Monitoring in the hospital to watch your symptoms. Follow these instructions at home:   Take over-the-counter and prescription medicines only as told by your doctor. Many medicines can make this condition worse.  Talk with your doctor about any medicines that you are taking.  Eat and drink exactly as you are told by your doctor. ? Eat only the foods you are told to eat. ? Limit how much fluid you take.  Do not drink alcohol.  Keep all follow-up visits as told by your doctor. This is important. Contact a doctor if:  You feel more like you may vomit.  You feel more tired.  Your headache gets worse.  You feel more confused.  You feel weaker.  Your symptoms go away and then they come back.  You have trouble following the diet instructions. Get help right away if:  You have a seizure.  You pass out.  You keep having watery poop.  You keep vomiting. Summary  Hyponatremia is when the amount of salt in your blood is too low.  When salt levels are low, you can have swelling throughout the body. The swelling mostly affects the brain.  Treatment depends on the cause. Treatment may include getting IV fluids, medicines, or not drinking as much fluid. This information is not intended to replace advice given to you by your health care provider. Make sure you discuss any questions you have with your health care provider. Document Revised: 05/27/2018 Document Reviewed: 02/11/2018 Elsevier Patient Education  [place].

## 2020-03-07 NOTE — Assessment & Plan Note (Signed)
-  BP at goal today. -Continue current medication regimen. -Will collect CMP today for medication monitoring. -Will continue to monitor.

## 2020-03-07 NOTE — Assessment & Plan Note (Signed)
-  Last lipid panel: total cholesterol 223, triglycerides 65, HDL 80, LDL 132 -Continue Crestor 40 mg. -Continue with a heart healthy diet and physical activity regimen. -Plan to repeat lipid panel with [facility].

## 2020-03-07 NOTE — Progress Notes (Signed)
Established Patient Office Visit  Subjective:  Patient ID: George [name], male    DOB: 1950/05/01  Age: 69 y.o. MRN: [id number]  CC:  Chief Complaint  Patient presents with  . Hypertension  . Hyperlipidemia    HPI George [name] presents for follow up on hypertension and hyperlipidemia. States has been taking an antihistamine and using Nasacort to help with nasal congestion related to allergies.  HTN: Pt denies chest pain, palpitations, dizziness, headache or edema. Taking medication as directed without side effects. Checks BP at home but not recently and readings were in 818H/63-14H with few systolic readings in 702O. Pt stays well hydrated.  HLD: Pt taking medication as directed without issues. Reports started taking Magnesium supplement to help with symptoms that felt like restless leg. States magnesium has provided symptom relief. Continues to watch diet which is low on red meat and mostly consists of lean meats like chicken and vegetables. Patient stays active with hiking/walking on Sundays.   Past Medical History:  Diagnosis Date  . 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 Colon polyp   . Elevated BP 04/23/2014  . GERD (gastroesophageal reflux disease)   . High cholesterol   . History of chicken pox    childhood  . Preventative health care 10/23/2013  . Tremor of right hand 04/17/2014    Past Surgical History:  Procedure Laterality Date  . COLONOSCOPY    . ESOPHAGEAL DILATION  2005   [facility]  . HERNIA REPAIR     right groin  . POLYPECTOMY      Family History  Problem Relation Age of Onset  . Hyperlipidemia Mother   . Hypertension Mother        mother-father  . Pulmonary fibrosis Mother   . Diabetes Father   . Heart disease Father        congestive heart failure  . Other Father        brain tumor  . Mental illness Sister        depression  . GER disease Son   . Stroke Maternal Grandmother   . Diabetes Maternal Grandmother   . Diabetes Other         mother -father  . Prostate cancer Neg Hx   . Breast cancer Neg Hx   . Colon cancer Neg Hx     Social History   Socioeconomic History  . Marital status: Married    Spouse name: Not on file  . Number of children: Not on file  . Years of education: Not on file  . Highest education level: Not on file  Occupational History  . Not on file  Tobacco Use  . Smoking status: Former Smoker    Packs/day: 1.00    Years: 22.00    Pack years: 22.00    Types: Cigarettes    Start date: 02/05/1970    Quit date: 03/07/1992    Years since quitting: 28.0  . Smokeless tobacco: Never Used  . Tobacco comment: quit 21 years ago  Vaping Use  . Vaping Use: Never used  Substance and Sexual Activity  . Alcohol use: Yes    Alcohol/week: 6.0 standard drinks    Types: 6 Cans of beer per week    Comment: social  . Drug use: No  . Sexual activity: Yes    Comment: lives with wife, no dietary restirctions  Other Topics Concern  . Not on file  Social History Narrative  . Not on file  Social Determinants of Health   Financial Resource Strain: Not on file  Food Insecurity: Not on file  Transportation Needs: Not on file  Physical Activity: Not on file  Stress: Not on file  Social Connections: Not on file  Intimate Partner Violence: Not on file    Outpatient Medications Prior to Visit  Medication Sig Dispense Refill  . cholecalciferol (VITAMIN D) 1000 UNITS tablet Take 1,000 Units by mouth daily. 2 tablets daily    . Coenzyme Q10 (CO Q 10 PO) Take by mouth every morning.     Marland Kitchen esomeprazole (NEXIUM) 20 MG capsule TAKE 1 CAPSULE BY MOUTH ONCE DAILY 90 capsule 1  . fluticasone (FLONASE) 50 MCG/ACT nasal spray 1 spray each nostril following sinus rinses twice daily 16 g 2  . GLUCOSAMINE HCL PO Take by mouth every morning.     Nyoka Cowden Tea, Camillia sinensis, (GREEN TEA PO) Take 315 mg by mouth every morning.     . lactobacillus acidophilus (BACID) TABS tablet Take 1 tablet by mouth daily.      Marland Kitchen losartan (COZAAR) 50 MG tablet TAKE 1 TABLET BY MOUTH ONCE DAILY 90 tablet 1  . Multiple Vitamin (MULTI-VITAMIN PO) Take by mouth every morning.     . mupirocin ointment (BACTROBAN) 2 % Apply to affected area TID for 7 days. (Patient not taking: Reported on 03/07/2020) 30 g 3  . NIACIN CR PO Take 500 mg by mouth daily.     . Omega-3 Fatty Acids (FISH OIL CONCENTRATE PO) Take by mouth every morning.     . psyllium (METAMUCIL) 58.6 % powder Take 1 packet by mouth 2 (two) times daily.     . rosuvastatin (CRESTOR) 40 MG tablet TAKE 1 TABLET BY MOUTH ONCE DAILY 90 tablet 1  . SEB-PREV [place] 10 % LIQD  (Patient not taking: No sig reported)  3  . Zinc 30 MG TABS Take by mouth every morning.     No facility-administered medications prior to visit.    Allergies  Allergen Reactions  . Doxycycline     Sore throat, red tongue, red skin    ROS Review of Systems A fourteen system review of systems was performed and found to be positive as per HPI.  Objective:    Physical Exam General:  Well Developed, well nourished, appropriate for stated age.  Neuro:  Alert and oriented,  extra-ocular muscles intact, on focal deficits   HEENT:  Normocephalic, atraumatic, neck supple Skin:  no gross rash, warm, pink. Cardiac:  RRR, S1 S2 Respiratory:  ECTA B/L w/o wheezing. Not using accessory muscles, speaking in full sentences- unlabored. Vascular:  Ext warm, no cyanosis apprec.; cap RF less 2 sec. Psych:  No HI/SI, judgement and insight good, Euthymic mood. Full Affect.   BP 122/79   Pulse 86   Ht 5' 8" (1.727 m)   Wt 165 lb 1.6 oz (74.9 kg)   SpO2 98%   BMI 25.10 kg/m  Wt Readings from Last 3 Encounters:  03/07/20 165 lb 1.6 oz (74.9 kg)  09/28/19 158 lb 8 oz (71.9 kg)  09/21/19 154 lb 8 oz (70.1 kg)     Health Maintenance Due  Topic Date Due  . COVID-19 Vaccine (1) Never done  . COLONOSCOPY  09/08/2018  . INFLUENZA VACCINE  10/23/2019    There are no preventive care reminders to  display for this patient.  Lab Results  Component Value Date   TSH 1.940 06/15/2019   Lab Results  Component Value  Date   WBC 51.1 (HH) 01/31/2020   HGB 13.7 01/31/2020   HCT 40.8 01/31/2020   MCV 95 01/31/2020   PLT 233 01/31/2020   Lab Results  Component Value Date   NA 127 (L) 01/31/2020   K 3.9 01/31/2020   CO2 23 01/31/2020   GLUCOSE 79 01/31/2020   BUN 8 01/31/2020   CREATININE 0.81 01/31/2020   BILITOT 0.5 01/31/2020   ALKPHOS 79 01/31/2020   AST 50 (H) 01/31/2020   ALT 21 01/31/2020   PROT 7.1 01/31/2020   ALBUMIN 4.9 (H) 01/31/2020   CALCIUM 9.7 01/31/2020   ANIONGAP 10 09/21/2019   GFR 113.38 12/11/2014   Lab Results  Component Value Date   CHOL 223 (H) 01/31/2020   Lab Results  Component Value Date   HDL 80 01/31/2020   Lab Results  Component Value Date   LDLCALC 132 (H) 01/31/2020   Lab Results  Component Value Date   TRIG 65 01/31/2020   Lab Results  Component Value Date   CHOLHDL 2.8 01/31/2020   Lab Results  Component Value Date   HGBA1C 5.1 06/15/2019      Assessment & Plan:   Problem List Items Addressed This Visit      Cardiovascular and Mediastinum   Essential hypertension - Primary (Chronic)    -BP at goal today. -Continue current medication regimen. -Will collect CMP today for medication monitoring. -Will continue to monitor.      Relevant Orders   Comp Met (CMET)     Other   Hyperlipidemia, mixed (Chronic)    -Last lipid panel: total cholesterol 223, triglycerides 65, HDL 80, LDL 132 -Continue Crestor 40 mg. -Continue with a heart healthy diet and physical activity regimen. -Plan to repeat lipid panel with [facility].        Other Visit Diagnoses    Hyponatremia       Environmental and seasonal allergies         Environmental and seasonal allergies: -Recommend to continue with antihistamine and nasal spray as needed. Advised to use Neti Pot especially before using nasal spray.  Hyponatremia: -Discussed with  patient possible etiologies and will repeat sodium level today. Patient reports staying well hydrated so possibly excessive hydration. -Will continue to monitor.   No orders of the defined types were placed in this encounter.   Follow-up: Return in about 3 months (around 06/05/2020) for [facility] and FBW few days prior.    Lorrene Reid, PA-C

## 2020-03-08 LAB — COMPREHENSIVE METABOLIC PANEL
ALT: 21 IU/L (ref 0–44)
AST: 39 IU/L (ref 0–40)
Albumin/Globulin Ratio: 2.2 (ref 1.2–2.2)
Albumin: 4.6 g/dL (ref 3.8–4.8)
Alkaline Phosphatase: 71 IU/L (ref 44–121)
BUN/Creatinine Ratio: 11 (ref 10–24)
BUN: 8 mg/dL (ref 8–27)
Bilirubin Total: 0.7 mg/dL (ref 0.0–1.2)
CO2: 23 mmol/L (ref 20–29)
Calcium: 9.3 mg/dL (ref 8.6–10.2)
Chloride: 101 mmol/L (ref 96–106)
Creatinine, Ser: 0.72 mg/dL — ABNORMAL LOW (ref 0.76–1.27)
GFR calc Af Amer: 111 mL/min/{1.73_m2} (ref >59)
GFR calc non Af Amer: 96 mL/min/{1.73_m2} (ref >59)
Globulin, Total: 2.1 g/dL (ref 1.5–4.5)
Glucose: 91 mg/dL (ref 65–99)
Potassium: 4.2 mmol/L (ref 3.5–5.2)
Sodium: 138 mmol/L (ref 134–144)
Total Protein: 6.7 g/dL (ref 6.0–8.5)

## 2020-05-28 ENCOUNTER — Other Ambulatory Visit: Payer: Self-pay | Admitting: Physician Assistant

## 2020-05-28 DIAGNOSIS — I10 Essential (primary) hypertension: Secondary | ICD-10-CM

## 2020-05-28 DIAGNOSIS — E782 Mixed hyperlipidemia: Secondary | ICD-10-CM

## 2020-05-28 DIAGNOSIS — K219 Gastro-esophageal reflux disease without esophagitis: Secondary | ICD-10-CM

## 2020-05-28 MED FILL — LOSARTAN POTASSIUM 50 MG TA: 50 | 30 days supply | Qty: 30 | Fill #0

## 2020-05-28 MED FILL — ESOMEPRAZOLE MAGNESIUM 20 M: 20 | 90 days supply | Qty: 90 | Fill #0

## 2020-05-28 MED FILL — ROSUVASTATIN CALCIUM 40 MG: 40 | 90 days supply | Qty: 90 | Fill #0

## 2020-05-29 ENCOUNTER — Other Ambulatory Visit: Payer: Self-pay | Admitting: Physician Assistant

## 2020-05-29 DIAGNOSIS — I10 Essential (primary) hypertension: Secondary | ICD-10-CM

## 2020-05-29 MED ORDER — LOSARTAN POTASSIUM 50 MG PO TABS: 50.0000 mg | ORAL_TABLET | Freq: Every day | ORAL | 0 refills | Status: DC

## 2020-05-29 NOTE — Telephone Encounter (Signed)
Left voicemail for patient. Patient is scheduled per last AVS for March already. Does this need a separate appointment?

## 2020-05-29 NOTE — Telephone Encounter (Signed)
Please contact patient to schedule apt per last AVS for further med refills. AS, CMA 

## 2020-06-08 ENCOUNTER — Other Ambulatory Visit: Payer: Self-pay | Admitting: Physician Assistant

## 2020-06-08 DIAGNOSIS — E559 Vitamin D deficiency, unspecified: Secondary | ICD-10-CM

## 2020-06-08 DIAGNOSIS — I10 Essential (primary) hypertension: Secondary | ICD-10-CM

## 2020-06-08 DIAGNOSIS — Z0000 Encounter for general adult medical examination without abnormal findings: Secondary | ICD-10-CM

## 2020-06-08 DIAGNOSIS — E782 Mixed hyperlipidemia: Secondary | ICD-10-CM

## 2020-06-12 ENCOUNTER — Other Ambulatory Visit: Payer: PPO

## 2020-06-15 ENCOUNTER — Other Ambulatory Visit (HOSPITAL_BASED_OUTPATIENT_CLINIC_OR_DEPARTMENT_OTHER): Payer: Self-pay

## 2020-06-15 ENCOUNTER — Ambulatory Visit: Payer: PPO | Admitting: Physician Assistant

## 2020-07-17 ENCOUNTER — Other Ambulatory Visit (HOSPITAL_COMMUNITY): Payer: Self-pay

## 2020-07-17 MED FILL — Losartan Potassium Tab 50 MG: ORAL | 60 days supply | Qty: 60 | Fill #0 | Status: AC

## 2020-09-20 ENCOUNTER — Inpatient Hospital Stay: Payer: PPO | Attending: Hematology & Oncology

## 2020-09-20 ENCOUNTER — Telehealth: Payer: Self-pay | Admitting: *Deleted

## 2020-09-20 ENCOUNTER — Inpatient Hospital Stay: Payer: PPO | Admitting: Hematology & Oncology

## 2020-09-20 ENCOUNTER — Encounter: Payer: Self-pay | Admitting: Hematology & Oncology

## 2020-09-20 ENCOUNTER — Other Ambulatory Visit: Payer: Self-pay

## 2020-09-20 ENCOUNTER — Telehealth: Payer: Self-pay

## 2020-09-20 VITALS — BP 157/86 | HR 76 | Temp 98.1°F | Resp 18 | Wt 162.0 lb

## 2020-09-20 DIAGNOSIS — Z79899 Other long term (current) drug therapy: Secondary | ICD-10-CM | POA: Diagnosis not present

## 2020-09-20 DIAGNOSIS — C9110 Chronic lymphocytic leukemia of B-cell type not having achieved remission: Secondary | ICD-10-CM | POA: Diagnosis not present

## 2020-09-20 LAB — CBC WITH DIFFERENTIAL (CANCER CENTER ONLY)
Abs Immature Granulocytes: 0.07 10*3/uL (ref 0.00–0.07)
Basophils Absolute: 0.2 10*3/uL — ABNORMAL HIGH (ref 0.0–0.1)
Basophils Relative: 0 %
Eosinophils Absolute: 0 10*3/uL (ref 0.0–0.5)
Eosinophils Relative: 0 %
HCT: 40.6 % (ref 39.0–52.0)
Hemoglobin: 13.3 g/dL (ref 13.0–17.0)
Immature Granulocytes: 0 %
Lymphocytes Relative: 87 %
Lymphs Abs: 51.3 10*3/uL — ABNORMAL HIGH (ref 0.7–4.0)
MCH: 31.7 pg (ref 26.0–34.0)
MCHC: 32.8 g/dL (ref 30.0–36.0)
MCV: 96.9 fL (ref 80.0–100.0)
Monocytes Absolute: 3.9 10*3/uL — ABNORMAL HIGH (ref 0.1–1.0)
Monocytes Relative: 7 %
Neutro Abs: 3.4 10*3/uL (ref 1.7–7.7)
Neutrophils Relative %: 6 %
Platelet Count: 198 10*3/uL (ref 150–400)
RBC: 4.19 MIL/uL — ABNORMAL LOW (ref 4.22–5.81)
RDW: 12.7 % (ref 11.5–15.5)
WBC Count: 59 10*3/uL (ref 4.0–10.5)
nRBC: 0 % (ref 0.0–0.2)

## 2020-09-20 LAB — CMP (CANCER CENTER ONLY)
ALT: 24 U/L (ref 0–44)
AST: 37 U/L (ref 15–41)
Albumin: 4.7 g/dL (ref 3.5–5.0)
Alkaline Phosphatase: 71 U/L (ref 38–126)
Anion gap: 8 (ref 5–15)
BUN: 10 mg/dL (ref 8–23)
CO2: 28 mmol/L (ref 22–32)
Calcium: 9.6 mg/dL (ref 8.9–10.3)
Chloride: 94 mmol/L — ABNORMAL LOW (ref 98–111)
Creatinine: 0.76 mg/dL (ref 0.61–1.24)
GFR, Estimated: >60 mL/min (ref >60)
Glucose, Bld: 116 mg/dL — ABNORMAL HIGH (ref 70–99)
Potassium: 4.6 mmol/L (ref 3.5–5.1)
Sodium: 130 mmol/L — ABNORMAL LOW (ref 135–145)
Total Bilirubin: 1.4 mg/dL — ABNORMAL HIGH (ref 0.3–1.2)
Total Protein: 6.9 g/dL (ref 6.5–8.1)

## 2020-09-20 LAB — SAVE SMEAR(SSMR), FOR PROVIDER SLIDE REVIEW

## 2020-09-20 LAB — LACTATE DEHYDROGENASE: LDH: 84 U/L — ABNORMAL LOW (ref 98–192)

## 2020-09-20 NOTE — Telephone Encounter (Signed)
Appts made per 09/20/20 los and pt req to view on [employer]

## 2020-09-20 NOTE — Progress Notes (Signed)
Hematology and Oncology Follow Up Visit  George [name] [id number] Sep 14, 1950 70 y.o. 09/20/2020   Principle Diagnosis:  CLL-stage A  Current Therapy:   Observation     Interim History:  [name] is back for followup.  I see him yearly.  Since we last saw him, has been doing quite well.  He and his wife have not been able to do any traveling because of their old dog.  There is dog is 53 years old and has his health issues.  He has been very active.  He does bike riding.  He does do hiking.  He has had no health issues since we last saw him.  He has had no problems with nausea or vomiting.  He has had no swollen lymph nodes.    There is no problems with COVID.  He and his wife are very cautious with COVID.  They have not been vaccinated.  He has had no rashes.  He has had no bleeding.  There is been no change in bowel or bladder habits.  Overall, his performance status is ECOG 0.    Medications:  Current Outpatient Medications:    cholecalciferol (VITAMIN D) 1000 UNITS tablet, Take 1,000 Units by mouth daily. 2 tablets daily, Disp: , Rfl:    Coenzyme Q10 (CO Q 10 PO), Take by mouth every morning. , Disp: , Rfl:    esomeprazole (NEXIUM) 20 MG capsule, TAKE 1 CAPSULE BY MOUTH ONCE A DAY, Disp: 90 capsule, Rfl: 0   fluticasone (FLONASE) 50 MCG/ACT nasal spray, 1 spray each nostril following sinus rinses twice daily, Disp: 16 g, Rfl: 2   GLUCOSAMINE HCL PO, Take by mouth every morning. , Disp: , Rfl:    Green Tea, Camillia sinensis, (GREEN TEA PO), Take 315 mg by mouth every morning. , Disp: , Rfl:    lactobacillus acidophilus (BACID) TABS tablet, Take 1 tablet by mouth daily. , Disp: , Rfl:    losartan (COZAAR) 50 MG tablet, TAKE 1 TABLET BY MOUTH ONCE A DAY. NEED APPT FOR REFILLS., Disp: 60 tablet, Rfl: 0   Multiple Vitamin (MULTI-VITAMIN PO), Take by mouth every morning. , Disp: , Rfl:    NIACIN CR PO, Take 500 mg by mouth daily. , Disp: , Rfl:    Omega-3 Fatty Acids (FISH OIL  CONCENTRATE PO), Take by mouth every morning. , Disp: , Rfl:    psyllium (METAMUCIL) 58.6 % powder, Take 1 packet by mouth 2 (two) times daily. , Disp: , Rfl:    rosuvastatin (CRESTOR) 40 MG tablet, TAKE 1 TABLET BY MOUTH ONCE A DAY, Disp: 90 tablet, Rfl: 0   Zinc 30 MG TABS, Take by mouth every morning., Disp: , Rfl:   Allergies:  Allergies  Allergen Reactions   Doxycycline     Sore throat, red tongue, red skin    Past Medical History, Surgical history, Social history, and Family History were reviewed and updated.  Review of Systems: Review of Systems  Constitutional: Negative.   HENT: Negative.    Eyes: Negative.   Respiratory: Negative.    Cardiovascular: Negative.   Gastrointestinal: Negative.   Genitourinary: Negative.   Musculoskeletal: Negative.   Skin: Negative.   Neurological: Negative.   Endo/Heme/Allergies: Negative.   Psychiatric/Behavioral: Negative.      Physical Exam:  weight is 162 lb (73.5 kg). His oral temperature is 98.1 F (36.7 C). His blood pressure is 157/86 (abnormal) and his pulse is 76. His respiration is 18 and oxygen saturation  is 99%.   Physical Exam Vitals reviewed.  HENT:     Head: Normocephalic and atraumatic.  Eyes:     Pupils: Pupils are equal, round, and reactive to light.  Cardiovascular:     Rate and Rhythm: Normal rate and regular rhythm.     Heart sounds: Normal heart sounds.  Pulmonary:     Effort: Pulmonary effort is normal.     Breath sounds: Normal breath sounds.  Abdominal:     General: Bowel sounds are normal.     Palpations: Abdomen is soft.  Musculoskeletal:        General: No tenderness or deformity. Normal range of motion.     Cervical back: Normal range of motion.  Lymphadenopathy:     Cervical: No cervical adenopathy.  Skin:    General: Skin is warm and dry.     Findings: No erythema or rash.  Neurological:     Mental Status: He is alert and oriented to person, place, and time.  Psychiatric:         Behavior: Behavior normal.        Thought Content: Thought content normal.        Judgment: Judgment normal.     Lab Results  Component Value Date   WBC 59.0 (HH) 09/20/2020   HGB 13.3 09/20/2020   HCT 40.6 09/20/2020   MCV 96.9 09/20/2020   PLT 198 09/20/2020     Chemistry      Component Value Date/Time   NA 130 (L) 09/20/2020 0746   NA 138 03/07/2020 0949   K 4.6 09/20/2020 0746   CL 94 (L) 09/20/2020 0746   CO2 28 09/20/2020 0746   BUN 10 09/20/2020 0746   BUN 8 03/07/2020 0949   CREATININE 0.76 09/20/2020 0746   CREATININE 0.73 10/15/2015 0909      Component Value Date/Time   CALCIUM 9.6 09/20/2020 0746   ALKPHOS 71 09/20/2020 0746   AST 37 09/20/2020 0746   ALT 24 09/20/2020 0746   BILITOT 1.4 (H) 09/20/2020 0746      Impression and Plan: George [name] is a 70 year old gentleman. He has CLL.  It does look like his disease might be more active now.  We have been seeing him for about 14 years.   Hopefully, his white cell count would not continue to trend upward.  I do worry that there is a trend that has been established.  He is not anemic or thrombocytopenic.  We will have to continue to watch him closely.  He like to come in yearly.  I still think this is okay for him.  He always knows he can come back sooner if he has any problems.    Volanda Napoleon, MD 6/30/20228:21 AM

## 2020-09-20 NOTE — Telephone Encounter (Signed)
George [name] from lab brought me a panic lab value of WBC of 59.0. Results given to MD.

## 2020-10-05 ENCOUNTER — Telehealth: Payer: Self-pay | Admitting: Physician Assistant

## 2020-10-05 NOTE — Telephone Encounter (Signed)
Patient has a scab on his head where it gets irritated easily where the location it sits upon head. It is on the right side of his head in the hairline. It gets irritated when washing hair and wearing a helmet for bike reading. Patient states it has been there about a month. Please advise, thanks.

## 2020-10-05 NOTE — Telephone Encounter (Signed)
Patient will need to schedule an appointment to discuss with provider.

## 2020-10-08 ENCOUNTER — Other Ambulatory Visit (HOSPITAL_COMMUNITY): Payer: Self-pay

## 2020-10-08 ENCOUNTER — Other Ambulatory Visit: Payer: Self-pay | Admitting: Physician Assistant

## 2020-10-08 DIAGNOSIS — E782 Mixed hyperlipidemia: Secondary | ICD-10-CM

## 2020-10-08 DIAGNOSIS — K219 Gastro-esophageal reflux disease without esophagitis: Secondary | ICD-10-CM

## 2020-10-08 DIAGNOSIS — I10 Essential (primary) hypertension: Secondary | ICD-10-CM

## 2020-10-08 MED ORDER — ROSUVASTATIN CALCIUM 40 MG PO TABS
ORAL_TABLET | Freq: Every day | ORAL | 0 refills | Status: DC
  Filled 2020-10-08: qty 90, 90d supply, fill #0

## 2020-10-08 MED ORDER — LOSARTAN POTASSIUM 50 MG PO TABS
ORAL_TABLET | ORAL | 0 refills | Status: DC
  Filled 2020-10-08: qty 60, 60d supply, fill #0

## 2020-10-08 MED ORDER — ESOMEPRAZOLE MAGNESIUM 20 MG PO CPDR
DELAYED_RELEASE_CAPSULE | Freq: Every day | ORAL | 0 refills | Status: DC
  Filled 2020-10-08: qty 90, 90d supply, fill #0

## 2020-10-09 ENCOUNTER — Other Ambulatory Visit (HOSPITAL_COMMUNITY): Payer: Self-pay

## 2020-10-09 ENCOUNTER — Ambulatory Visit (INDEPENDENT_AMBULATORY_CARE_PROVIDER_SITE_OTHER): Payer: PPO | Admitting: Physician Assistant

## 2020-10-09 ENCOUNTER — Other Ambulatory Visit: Payer: Self-pay

## 2020-10-09 ENCOUNTER — Encounter: Payer: Self-pay | Admitting: Physician Assistant

## 2020-10-09 VITALS — BP 138/85 | HR 81 | Temp 98.0°F | Ht 68.0 in | Wt 161.2 lb

## 2020-10-09 DIAGNOSIS — C9110 Chronic lymphocytic leukemia of B-cell type not having achieved remission: Secondary | ICD-10-CM

## 2020-10-09 DIAGNOSIS — E782 Mixed hyperlipidemia: Secondary | ICD-10-CM

## 2020-10-09 DIAGNOSIS — L570 Actinic keratosis: Secondary | ICD-10-CM

## 2020-10-09 DIAGNOSIS — I10 Essential (primary) hypertension: Secondary | ICD-10-CM | POA: Diagnosis not present

## 2020-10-09 MED ORDER — MUPIROCIN 2 % EX OINT
1.0000 "application " | TOPICAL_OINTMENT | Freq: Two times a day (BID) | CUTANEOUS | 0 refills | Status: DC
  Filled 2020-10-09: qty 22, 11d supply, fill #0

## 2020-10-09 MED ORDER — LOSARTAN POTASSIUM 50 MG PO TABS
50.0000 mg | ORAL_TABLET | Freq: Every day | ORAL | 0 refills | Status: DC
  Filled 2020-10-09: qty 90, 90d supply, fill #0

## 2020-10-09 NOTE — Progress Notes (Signed)
Established Patient Office Visit  Subjective:  Patient ID: George [name], male    DOB: Oct 26, 1950  Age: 70 y.o. MRN: [id number]  CC:  Chief Complaint  Patient presents with   Acute Visit    Sore on Head    HPI George [name] presents for follow up on hypertension and hyperlipidemia. Patient reports has seen dermatology in the past and was treated for actinic keratosis with fluoroplex for a lesion on his scalp. States lesion does not seem to heal completely and gets irritated by wearing his helmet. Does report intermittent bleeding. No fever or chills. Has been applying old rx of Bactroban which has helped some. Tried to schedule an appointment with his previous dermatology but earliest appointment was until September. Patient reports lesion seems to be larger in size than before.  HTN: Pt denies chest pain, palpitations, dizziness or headache. Taking medication as directed without side effects. Has not been checking BP at home and states does not feel like his BP has been elevated. Staying hydrated.  HLD: Pt taking medication as directed without issues. Denies side effects including myalgias and arthralgias. Reports is starting to bike again. Has not been riding his bike as much due to his skin lesion getting irritated.    Past Medical History:  Diagnosis Date   CLL (chronic lymphocytic leukemia) ([place]) 2009   Colon polyp    Elevated BP 04/23/2014   GERD (gastroesophageal reflux disease)    High cholesterol    History of chicken pox    childhood   Preventative health care 10/23/2013   Tremor of right hand 04/17/2014    Past Surgical History:  Procedure Laterality Date   COLONOSCOPY     ESOPHAGEAL DILATION  2005   [facility]   HERNIA REPAIR     right groin   POLYPECTOMY      Family History  Problem Relation Age of Onset   Hyperlipidemia Mother    Hypertension Mother        mother-father   Pulmonary fibrosis Mother    Diabetes Father    Heart disease Father         congestive heart failure   Other Father        brain tumor   Mental illness Sister        depression   GER disease Son    Stroke Maternal Grandmother    Diabetes Maternal Grandmother    Diabetes Other        mother -father   Prostate cancer Neg Hx    Breast cancer Neg Hx    Colon cancer Neg Hx     Social History   Socioeconomic History   Marital status: Married    Spouse name: Not on file   Number of children: Not on file   Years of education: Not on file   Highest education level: Not on file  Occupational History   Not on file  Tobacco Use   Smoking status: Former    Packs/day: 1.00    Years: 22.00    Pack years: 22.00    Types: Cigarettes    Start date: 02/05/1970    Quit date: 03/07/1992    Years since quitting: 28.6   Smokeless tobacco: Never   Tobacco comments:    quit 21 years ago  Vaping Use   Vaping Use: Never used  Substance and Sexual Activity   Alcohol use: Yes    Alcohol/week: 6.0 standard drinks    Types: 6 Cans  of beer per week    Comment: social   Drug use: No   Sexual activity: Yes    Comment: lives with wife, no dietary restirctions  Other Topics Concern   Not on file  Social History Narrative   Not on file   Social Determinants of Health   Financial Resource Strain: Not on file  Food Insecurity: Not on file  Transportation Needs: Not on file  Physical Activity: Not on file  Stress: Not on file  Social Connections: Not on file  Intimate Partner Violence: Not on file    Outpatient Medications Prior to Visit  Medication Sig Dispense Refill   cholecalciferol (VITAMIN D) 1000 UNITS tablet Take 1,000 Units by mouth daily. 2 tablets daily     Coenzyme Q10 (CO Q 10 PO) Take by mouth every morning.      esomeprazole (NEXIUM) 20 MG capsule TAKE 1 CAPSULE BY MOUTH ONCE A DAY 90 capsule 0   fluticasone (FLONASE) 50 MCG/ACT nasal spray 1 spray each nostril following sinus rinses twice daily 16 g 2   GLUCOSAMINE HCL PO Take by mouth every  morning.      Green Tea, Camillia sinensis, (GREEN TEA PO) Take 315 mg by mouth every morning.      lactobacillus acidophilus (BACID) TABS tablet Take 1 tablet by mouth daily.      Multiple Vitamin (MULTI-VITAMIN PO) Take by mouth every morning.      NIACIN CR PO Take 500 mg by mouth daily.      Omega-3 Fatty Acids (FISH OIL CONCENTRATE PO) Take by mouth every morning.      psyllium (METAMUCIL) 58.6 % powder Take 1 packet by mouth 2 (two) times daily.      rosuvastatin (CRESTOR) 40 MG tablet TAKE 1 TABLET BY MOUTH ONCE A DAY 90 tablet 0   Zinc 30 MG TABS Take by mouth every morning.     losartan (COZAAR) 50 MG tablet TAKE 1 TABLET BY MOUTH ONCE A DAY. NEED APPT FOR REFILLS. 60 tablet 0   No facility-administered medications prior to visit.    Allergies  Allergen Reactions   Doxycycline     Sore throat, red tongue, red skin    ROS Review of Systems Review of Systems:  A fourteen system review of systems was performed and found to be positive as per HPI.   Objective:    Physical Exam General:  Well Developed, well nourished, in no acute distress  Neuro:  Alert and oriented,  extra-ocular muscles intact  HEENT:  Normocephalic, atraumatic, neck supple Skin:  about quarter size lesion on scalp with surrounding erythema and slight area of fluctuance   Cardiac:  RRR, S1 S2 Respiratory:  ECTA B/L w/o wheezing, crackles or rales, Not using accessory muscles, speaking in full sentences- unlabored. Vascular:  Ext warm, no cyanosis apprec.; cap RF less 2 sec. No gross edema Psych:  No HI/SI, judgement and insight good, Euthymic mood. Full Affect.  BP 138/85   Pulse 81   Temp 98 F (36.7 C)   Ht 5\' 8"  (1.727 m)   Wt 161 lb 3.2 oz (73.1 kg)   SpO2 100%   BMI 24.51 kg/m  Wt Readings from Last 3 Encounters:  10/09/20 161 lb 3.2 oz (73.1 kg)  09/20/20 162 lb (73.5 kg)  03/07/20 165 lb 1.6 oz (74.9 kg)     Health Maintenance Due  Topic Date Due   COVID-19 Vaccine (1) Never done    COLONOSCOPY (Pts 45-32yrs Insurance  coverage will need to be confirmed)  09/08/2018    There are no preventive care reminders to display for this patient.  Lab Results  Component Value Date   TSH 1.940 06/15/2019   Lab Results  Component Value Date   WBC 59.0 (HH) 09/20/2020   HGB 13.3 09/20/2020   HCT 40.6 09/20/2020   MCV 96.9 09/20/2020   PLT 198 09/20/2020   Lab Results  Component Value Date   NA 130 (L) 09/20/2020   K 4.6 09/20/2020   CO2 28 09/20/2020   GLUCOSE 116 (H) 09/20/2020   BUN 10 09/20/2020   CREATININE 0.76 09/20/2020   BILITOT 1.4 (H) 09/20/2020   ALKPHOS 71 09/20/2020   AST 37 09/20/2020   ALT 24 09/20/2020   PROT 6.9 09/20/2020   ALBUMIN 4.7 09/20/2020   CALCIUM 9.6 09/20/2020   ANIONGAP 8 09/20/2020   GFR 113.38 12/11/2014   Lab Results  Component Value Date   CHOL 223 (H) 01/31/2020   Lab Results  Component Value Date   HDL 80 01/31/2020   Lab Results  Component Value Date   LDLCALC 132 (H) 01/31/2020   Lab Results  Component Value Date   TRIG 65 01/31/2020   Lab Results  Component Value Date   CHOLHDL 2.8 01/31/2020   Lab Results  Component Value Date   HGBA1C 5.1 06/15/2019      Assessment & Plan:   Problem List Items Addressed This Visit       Cardiovascular and Mediastinum   Essential hypertension (Chronic)    -Stable. -Continue current medication regimen. Recent CMP, sodium mildly low. Will continue to monitor and repeat with [facility]. -Will continue to monitor.       Relevant Medications   losartan (COZAAR) 50 MG tablet     Other   CLL (chronic lymphocytic leukemia) ([facility]) (Chronic)    -Followed by hematology/oncology. -Recent CBC: 59.0       Hyperlipidemia, mixed - Primary (Chronic)    -Last lipid panel: total cholesterol 223, triglycerides 65, HDL 80, LDL 132 -Continue current medication regimen. Recent hepatic function normal. -Recommend to reduce saturated and trans fats. Continue to stay as active as  possible. -Will repeat lipid panel with [facility], if LDL remains elevated recommend to consider adjunct therapy with statin therapy, patient is on max dose of high intensity statin.       Relevant Medications   losartan (COZAAR) 50 MG tablet   Other Visit Diagnoses     Actinic keratosis       Relevant Orders   Ambulatory referral to Dermatology      Actinic keratosis: -Will place referral to dermatology. -There are signs concerning for infection so will send rx for topical antibiotic.  -Recommend to continue with sunscreen use and monitor for worsening symptoms.   Meds ordered this encounter  Medications   mupirocin ointment (BACTROBAN) 2 %    Sig: Apply 1 application topically 2 (two) times daily.    Dispense:  22 g    Refill:  0    Order Specific Question:   Supervising Provider    Answer:   Beatrice Lecher D [[id number]]   losartan (COZAAR) 50 MG tablet    Sig: Take 1 tablet (50 mg total) by mouth daily.    Dispense:  90 tablet    Refill:  0    Order Specific Question:   Supervising Provider    Answer:   Beatrice Lecher D [[id number]]    Follow-up: Return in about 4  months (around 02/09/2021) for [facility] and [facility] few days prior .   Note:  This note was prepared with assistance of Dragon voice recognition software. Occasional wrong-word or sound-a-like substitutions may have occurred due to the inherent limitations of voice recognition software.  Lorrene Reid, PA-C

## 2020-10-09 NOTE — Assessment & Plan Note (Signed)
-  Last lipid panel: total cholesterol 223, triglycerides 65, HDL 80, LDL 132 -Continue current medication regimen. Recent hepatic function normal. -Recommend to reduce saturated and trans fats. Continue to stay as active as possible. -Will repeat lipid panel with [facility], if LDL remains elevated recommend to consider adjunct therapy with statin therapy, patient is on max dose of high intensity statin.

## 2020-10-09 NOTE — Assessment & Plan Note (Signed)
-  Followed by hematology/oncology. -Recent CBC: 59.0

## 2020-10-09 NOTE — Assessment & Plan Note (Signed)
-  Stable. -Continue current medication regimen. Recent CMP, sodium mildly low. Will continue to monitor and repeat with [facility]. -Will continue to monitor.

## 2020-10-09 NOTE — Patient Instructions (Signed)
Heart-Healthy Eating Plan Many factors influence your heart (coronary) health, including eating and exercise habits. Coronary risk increases with abnormal blood fat (lipid) levels. Heart-healthy meal planning includes limiting unhealthy fats,increasing healthy fats, and making other diet and lifestyle changes. What is my plan? Your health care provider may recommend that you: Limit your fat intake to _________% or less of your total calories each day. Limit your saturated fat intake to _________% or less of your total calories each day. Limit the amount of cholesterol in your diet to less than _________ mg per day. What are tips for following this plan? Cooking Cook foods using methods other than frying. Baking, boiling, grilling, and broiling are all good options. Other ways to reduce fat include: Removing the skin from poultry. Removing all visible fats from meats. Steaming vegetables in water or broth. Meal planning  At meals, imagine dividing your plate into fourths: Fill one-half of your plate with vegetables and green salads. Fill one-fourth of your plate with whole grains. Fill one-fourth of your plate with lean protein foods. Eat 4-5 servings of vegetables per day. One serving equals 1 cup raw or cooked vegetable, or 2 cups raw leafy greens. Eat 4-5 servings of fruit per day. One serving equals 1 medium whole fruit,  cup dried fruit,  cup fresh, frozen, or canned fruit, or  cup 100% fruit juice. Eat more foods that contain soluble fiber. Examples include apples, broccoli, carrots, beans, peas, and barley. Aim to get 25-30 g of fiber per day. Increase your consumption of legumes, nuts, and seeds to 4-5 servings per week. One serving of dried beans or legumes equals  cup cooked, 1 serving of nuts is  cup, and 1 serving of seeds equals 1 tablespoon.  Fats Choose healthy fats more often. Choose monounsaturated and polyunsaturated fats, such as olive and canola oils, flaxseeds,  walnuts, almonds, and seeds. Eat more omega-3 fats. Choose salmon, mackerel, sardines, tuna, flaxseed oil, and ground flaxseeds. Aim to eat fish at least 2 times each week. Check food labels carefully to identify foods with trans fats or high amounts of saturated fat. Limit saturated fats. These are found in animal products, such as meats, butter, and cream. Plant sources of saturated fats include palm oil, palm kernel oil, and coconut oil. Avoid foods with partially hydrogenated oils in them. These contain trans fats. Examples are stick margarine, some tub margarines, cookies, crackers, and other baked goods. Avoid fried foods. General information Eat more home-cooked food and less restaurant, buffet, and fast food. Limit or avoid alcohol. Limit foods that are high in starch and sugar. Lose weight if you are overweight. Losing just 5-10% of your body weight can help your overall health and prevent diseases such as diabetes and heart disease. Monitor your salt (sodium) intake, especially if you have high blood pressure. Talk with your health care provider about your sodium intake. Try to incorporate more vegetarian meals weekly. What foods can I eat? Fruits All fresh, canned (in natural juice), or frozen fruits. Vegetables Fresh or frozen vegetables (raw, steamed, roasted, or grilled). Green salads. Grains Most grains. Choose whole wheat and whole grains most of the time. Rice andpasta, including brown rice and pastas made with whole wheat. Meats and other proteins Lean, well-trimmed beef, veal, pork, and lamb. Chicken and turkey without skin. All fish and shellfish. Wild duck, rabbit, pheasant, and venison. Egg whites or low-cholesterol egg substitutes. Dried beans, peas, lentils, and tofu. Seedsand most nuts. Dairy Low-fat or nonfat cheeses, including ricotta   and mozzarella. Skim or 1% milk (liquid, powdered, or evaporated). Buttermilk made with low-fat milk. Nonfat orlow-fat yogurt. Fats  and oils Non-hydrogenated (trans-free) margarines. Vegetable oils, including soybean, sesame, sunflower, olive, peanut, safflower, corn, canola, and cottonseed. Salad dressings or mayonnaisemade with a vegetable oil. Beverages Water (mineral or sparkling). Coffee and tea. Diet carbonated beverages. Sweets and desserts Sherbet, gelatin, and fruit ice. Small amounts of dark chocolate. Limit all sweets and desserts. Seasonings and condiments All seasonings and condiments. The items listed above may not be a complete list of foods and beverages you can eat. Contact a dietitian for more options. What foods are not recommended? Fruits Canned fruit in heavy syrup. Fruit in cream or butter sauce. Fried fruit. Limitcoconut. Vegetables Vegetables cooked in cheese, cream, or butter sauce. Fried vegetables. Grains Breads made with saturated or trans fats, oils, or whole milk. Croissants. Sweet rolls. Donuts. High-fat crackers,such as cheese crackers. Meats and other proteins Fatty meats, such as hot dogs, ribs, sausage, bacon, rib-eye roast or steak. High-fat deli meats, such as salami and bologna. Caviar. Domestic duck andgoose. Organ meats, such as liver. Dairy Cream, sour cream, cream cheese, and creamed cottage cheese. Whole milk cheeses. Whole or 2% milk (liquid, evaporated, or condensed). Whole buttermilk.Cream sauce or high-fat cheese sauce. Whole-milk yogurt. Fats and oils Meat fat, or shortening. Cocoa butter, hydrogenated oils, palm oil, coconut oil, palm kernel oil. Solid fats and shortenings, including bacon fat, salt pork, lard, and butter. Nondairy cream substitutes. Salad dressings with cheeseor sour cream. Beverages Regular sodas and any drinks with added sugar. Sweets and desserts Frosting. Pudding. Cookies. Cakes. Pies. Milk chocolate or white chocolate.Buttered syrups. Full-fat ice cream or ice cream drinks. The items listed above may not be a complete list of foods and beverages to  avoid. Contact a dietitian for more information. Summary Heart-healthy meal planning includes limiting unhealthy fats, increasing healthy fats, and making other diet and lifestyle changes. Lose weight if you are overweight. Losing just 5-10% of your body weight can help your overall health and prevent diseases such as diabetes and heart disease. Focus on eating a balance of foods, including fruits and vegetables, low-fat or nonfat dairy, lean protein, nuts and legumes, whole grains, and heart-healthy oils and fats. This information is not intended to replace advice given to you by your health care provider. Make sure you discuss any questions you have with your healthcare provider. Document Revised: 04/17/2017 Document Reviewed: 04/17/2017 Elsevier Patient Education  2022 [employer]  

## 2020-10-10 DIAGNOSIS — C4442 Squamous cell carcinoma of skin of scalp and neck: Secondary | ICD-10-CM | POA: Diagnosis not present

## 2020-10-10 DIAGNOSIS — D485 Neoplasm of uncertain behavior of skin: Secondary | ICD-10-CM | POA: Diagnosis not present

## 2020-11-20 DIAGNOSIS — C4442 Squamous cell carcinoma of skin of scalp and neck: Secondary | ICD-10-CM | POA: Diagnosis not present

## 2020-12-04 DIAGNOSIS — C4442 Squamous cell carcinoma of skin of scalp and neck: Secondary | ICD-10-CM | POA: Diagnosis not present

## 2020-12-04 DIAGNOSIS — S0100XD Unspecified open wound of scalp, subsequent encounter: Secondary | ICD-10-CM | POA: Diagnosis not present

## 2020-12-07 ENCOUNTER — Telehealth: Payer: Self-pay | Admitting: Radiation Oncology

## 2020-12-07 NOTE — Telephone Encounter (Signed)
Called patient to schedule consultation with Dr. Isidore Moos. No answer, LMV to return call.

## 2020-12-12 ENCOUNTER — Ambulatory Visit: Payer: PPO | Admitting: Radiation Oncology

## 2020-12-12 ENCOUNTER — Ambulatory Visit: Payer: PPO

## 2020-12-18 DIAGNOSIS — R238 Other skin changes: Secondary | ICD-10-CM | POA: Diagnosis not present

## 2020-12-18 DIAGNOSIS — S0100XD Unspecified open wound of scalp, subsequent encounter: Secondary | ICD-10-CM | POA: Diagnosis not present

## 2020-12-18 NOTE — Progress Notes (Signed)
Radiation Oncology         [phone] ________________________________  Initial Outpatient Consultation  Name: George [name] MRN: [id number]  Date: 12/19/2020  DOB: Dec 24, 1950  PI:RJJOAC, Elvis Coil, MD   REFERRING PHYSICIAN: Harl Bowie, MD  DIAGNOSIS:    ICD-10-CM   1. 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 Cancer of skin of scalp  C44.40      Skin cancer of scalp  History of chronic lymphocytic leukemia (stage A) followed by Dr. Marin Olp for 14 years   Cancer Staging Squamous cell carcinoma of scalp Staging form: Cutaneous Carcinoma of the Head and Neck, AJCC 8th Edition - Pathologic stage from 12/19/2020: Stage III (pT3, pN0, cM0) - Signed by Eppie Gibson, MD on 12/19/2020 Stage prefix: Initial diagnosis Extraosseous extension: Absent  CHIEF COMPLAINT: Here to discuss management of skin cancer  HISTORY OF PRESENT ILLNESS::George [name] is a 70 y.o. male who presents today with a history of treated actinic keratosis with fluoroplex for a lesion on his scalp. During a visit with Lorrene Reid PA-C on 10/09/20, the patient reported that the lesion has not healed completely since his treatment 6 months ago, and gets irritated by wearing his helmet (with occasional bleeding).   Subsequently, the patient underwent mohs procedure of the right frontal scalp lesion on 11/20/20 under Dr. Hassell Done. Pathology from the procedure revealed: residual carcinoma with nerve invasion present (0.1 mm in diameter).   Dr. Hassell Done then referred the patient to radiation oncology for treatment of large nerve PNI with adjuvant radiation therapy.   Of note: the patient has CLL (stage A) and is followed by Dr. Marin Olp in regards to this. During his most recent follow-up with Dr. Marin Olp on 09/20/20, the patient was noted to likely have more active disease now due to increase in WBC. The patient is not anemic or thrombocytopenic and will continue to maintain close  follow-up with Dr. Marin Olp.  The patient had a skin graft placed at the surgical site of his scalp and it is still healing.  He continues to follow closely with Dr. Hassell Done  Intraoperative photos:     PREVIOUS RADIATION THERAPY: No  PAST MEDICAL HISTORY:  has a past medical history of CLL (chronic lymphocytic leukemia) ([place]) (2009), Colon polyp, Elevated BP (04/23/2014), GERD (gastroesophageal reflux disease), High cholesterol, History of chicken pox, Preventative health care (10/23/2013), and Tremor of right hand (04/17/2014).    PAST SURGICAL HISTORY: Past Surgical History:  Procedure Laterality Date   COLONOSCOPY     ESOPHAGEAL DILATION  2005   [facility]   HERNIA REPAIR     right groin   POLYPECTOMY      FAMILY HISTORY: family history includes Diabetes in his father, maternal grandmother, and another family member; GER disease in his son; Heart disease in his father; Hyperlipidemia in his mother; Hypertension in his mother; Mental illness in his sister; Other in his father; Pulmonary fibrosis in his mother; Stroke in his maternal grandmother.  SOCIAL HISTORY:  reports that he quit smoking about 28 years ago. His smoking use included cigarettes. He started smoking about 50 years ago. He has a 22.00 pack-year smoking history. He has never used smokeless tobacco. He reports current alcohol use of about 6.0 standard drinks per week. He reports that he does not use drugs.  ALLERGIES: Doxycycline  MEDICATIONS:  Current Outpatient Medications  Medication Sig Dispense Refill   cholecalciferol (VITAMIN D) 1000 UNITS tablet Take 1,000 Units  by mouth daily. 2 tablets daily     Coenzyme Q10 (CO Q 10 PO) Take by mouth every morning.      esomeprazole (NEXIUM) 20 MG capsule TAKE 1 CAPSULE BY MOUTH ONCE A DAY 90 capsule 0   fluticasone (FLONASE) 50 MCG/ACT nasal spray 1 spray each nostril following sinus rinses twice daily 16 g 2   GLUCOSAMINE HCL PO Take by mouth every morning.      Green Tea,  Camillia sinensis, (GREEN TEA PO) Take 315 mg by mouth every morning.      lactobacillus acidophilus (BACID) TABS tablet Take 1 tablet by mouth daily.      losartan (COZAAR) 50 MG tablet TAKE 1 TABLET BY MOUTH ONCE A DAY. NEED APPT FOR REFILLS. 60 tablet 0   losartan (COZAAR) 50 MG tablet Take 1 tablet (50 mg total) by mouth daily. 90 tablet 0   Multiple Vitamin (MULTI-VITAMIN PO) Take by mouth every morning.      mupirocin ointment (BACTROBAN) 2 % Apply topically twice daily 22 g 0   NIACIN CR PO Take 500 mg by mouth daily.      Omega-3 Fatty Acids (FISH OIL CONCENTRATE PO) Take by mouth every morning.      psyllium (METAMUCIL) 58.6 % powder Take 1 packet by mouth 2 (two) times daily.      rosuvastatin (CRESTOR) 40 MG tablet TAKE 1 TABLET BY MOUTH ONCE A DAY 90 tablet 0   Zinc 30 MG TABS Take by mouth every morning.     No current facility-administered medications for this encounter.    REVIEW OF SYSTEMS:  Notable for that above.   PHYSICAL EXAM:  height is 5' 8.5" (1.74 m) and weight is 152 lb 12.8 oz (69.3 kg). His temperature is 97.6 F (36.4 C). His blood pressure is 137/90 and his pulse is 88. His respiration is 20 and oxygen saturation is 98%.   General: Alert and oriented, in no acute distress  HEENT: Head is normocephalic. Extraocular movements are intact.   Neck: Neck is supple, no palpable cervical or supraclavicular lymphadenopathy. No adenopathy in occipital or periauricular regions. Lymphatics: see Neck Exam Skin: bandage remove from right frontal scalp.   Mohs surgical bed is still moist and healing Psychiatric: Judgment and insight are intact. Affect is appropriate.   ECOG = 1  0 - Asymptomatic (Fully active, able to carry on all predisease activities without restriction)  1 - Symptomatic but completely ambulatory (Restricted in physically strenuous activity but ambulatory and able to carry out work of a light or sedentary nature. For example, light housework, office  work)  2 - Symptomatic, <50% in bed during the day (Ambulatory and capable of all self care but unable to carry out any work activities. Up and about more than 50% of waking hours)  3 - Symptomatic, >50% in bed, but not bedbound (Capable of only limited self-care, confined to bed or chair 50% or more of waking hours)  4 - Bedbound (Completely disabled. Cannot carry on any self-care. Totally confined to bed or chair)  5 - Death   Eustace Pen MM, Creech RH, Tormey DC, et al. [phone]). "Toxicity and response criteria of the [facility]". [place] Oncol. 5 (6): 649-55   LABORATORY DATA:  Lab Results  Component Value Date   WBC 59.0 (HH) 09/20/2020   HGB 13.3 09/20/2020   HCT 40.6 09/20/2020   MCV 96.9 09/20/2020   PLT 198 09/20/2020   CMP  Component Value Date/Time   NA 130 (L) 09/20/2020 0746   NA 138 03/07/2020 0949   K 4.6 09/20/2020 0746   CL 94 (L) 09/20/2020 0746   CO2 28 09/20/2020 0746   GLUCOSE 116 (H) 09/20/2020 0746   BUN 10 09/20/2020 0746   BUN 8 03/07/2020 0949   CREATININE 0.76 09/20/2020 0746   CREATININE 0.73 10/15/2015 0909   CALCIUM 9.6 09/20/2020 0746   PROT 6.9 09/20/2020 0746   PROT 6.7 03/07/2020 0949   ALBUMIN 4.7 09/20/2020 0746   ALBUMIN 4.6 03/07/2020 0949   AST 37 09/20/2020 0746   ALT 24 09/20/2020 0746   ALKPHOS 71 09/20/2020 0746   BILITOT 1.4 (H) 09/20/2020 0746   GFRNONAA >60 09/20/2020 0746   GFRAA 111 03/07/2020 0949   GFRAA >60 09/21/2019 0804        RADIOGRAPHY: No results found.    IMPRESSION/PLAN: This is a delightful 70 yo with scalp squamous cell carcinoma, post op.  Today, I talked to the patient about the findings and work-up thus far.  We discussed the patient's diagnosis of skin cancer, scalp, with significant PNI and general treatment for this, highlighting the role of adjuvant radiotherapy in the management.  We discussed the available radiation techniques, and focused on the details of logistics and  delivery.    I recommend radiation - 50Gy/20 fractions - with electrons, once he has satisfactorily healed from surgery.  We discussed the risks, benefits, and side effects of radiotherapy. Side effects may include but not necessarily be limited to: skin irritation, fatigue, permanent hair loss, permanent skin changes, rare wound dehiscence, rare injury to skull or brain. No guarantees of treatment were given. A consent form was signed and placed in the patient's medical record. The patient was encouraged to ask questions that I answered to the best of my ability.    He and his wife have our  contact information and will contact us once he's cleared by his skin surgeon and healed from Mohs. We will then schedule for treatment planning.   On date of service, in total, I spent 50 minutes on this encounter. Patient was seen in person.   __________________________________________   Eppie Gibson, MD  This document serves as a record of services personally performed by Eppie Gibson, MD. It was created on her behalf by Roney Mans, a trained medical scribe. The creation of this record is based on the scribe's personal observations and the provider's statements to them. This document has been checked and approved by the attending provider.

## 2020-12-18 NOTE — Progress Notes (Signed)
Histology and Location of Primary Skin Cancer:    George [name] presented with the following signs/symptoms: a spot/lesion on the top, frontal part of his scalp that has not healed for the past 6+ months.   Past/Anticipated interventions by patient's surgeon/dermatologist for current problematic lesion, if any:  12/04/2020 (office visit) Dr. Harl Bowie   11/20/2020 Dr. Harl Bowie Mohs surgery  Past skin cancers, if any: None  History of Blistering sunburns, if any: During his youth  SAFETY ISSUES: Prior radiation? No Pacemaker/ICD? No Possible current pregnancy? N/A Is the patient on methotrexate? No  Current Complaints / other details:  Patient has history of CLL (currently under observation by Dr. Burney Gauze)

## 2020-12-19 ENCOUNTER — Ambulatory Visit: Payer: PPO | Admitting: Dermatology

## 2020-12-19 ENCOUNTER — Ambulatory Visit: Payer: PPO

## 2020-12-19 ENCOUNTER — Encounter: Payer: Self-pay | Admitting: Radiation Oncology

## 2020-12-19 ENCOUNTER — Ambulatory Visit
Admission: RE | Admit: 2020-12-19 | Discharge: 2020-12-19 | Disposition: A | Discharge status: Home / Self Care | Payer: PPO | Source: Ambulatory Visit | Attending: Radiation Oncology | Admitting: Radiation Oncology

## 2020-12-19 ENCOUNTER — Ambulatory Visit: Payer: PPO | Admitting: Radiation Oncology

## 2020-12-19 VITALS — BP 137/90 | HR 88 | Temp 97.6°F | Resp 20 | Ht 68.5 in | Wt 152.8 lb

## 2020-12-19 DIAGNOSIS — K219 Gastro-esophageal reflux disease without esophagitis: Secondary | ICD-10-CM | POA: Insufficient documentation

## 2020-12-19 DIAGNOSIS — Z86010 Personal history of colonic polyps: Secondary | ICD-10-CM | POA: Insufficient documentation

## 2020-12-19 DIAGNOSIS — Z87891 Personal history of nicotine dependence: Secondary | ICD-10-CM | POA: Diagnosis not present

## 2020-12-19 DIAGNOSIS — C9111 Chronic lymphocytic leukemia of B-cell type in remission: Secondary | ICD-10-CM | POA: Insufficient documentation

## 2020-12-19 DIAGNOSIS — Z79899 Other long term (current) drug therapy: Secondary | ICD-10-CM | POA: Insufficient documentation

## 2020-12-19 DIAGNOSIS — E7800 Pure hypercholesterolemia, unspecified: Secondary | ICD-10-CM | POA: Insufficient documentation

## 2020-12-19 DIAGNOSIS — C4442 Squamous cell carcinoma of skin of scalp and neck: Secondary | ICD-10-CM | POA: Diagnosis not present

## 2020-12-19 DIAGNOSIS — C4440 Unspecified malignant neoplasm of skin of scalp and neck: Secondary | ICD-10-CM

## 2020-12-19 NOTE — Progress Notes (Signed)
Oncology Nurse Navigator Documentation   Met with patient before initial consult with Dr. Eppie Gibson.  He was accompanied by his wife Kennyth Lose Introduced myself as his/their Navigator, explained my role as a member of the Care Team. I provided them with my direct contact information.  They verbalized understanding of information provided. I encouraged them to call with questions/concerns moving forward.  Harlow Asa, RN, BSN, OCN Head & Neck Oncology Nurse [place] [facility] [place] [phone]

## 2020-12-25 DIAGNOSIS — Z48817 Encounter for surgical aftercare following surgery on the skin and subcutaneous tissue: Secondary | ICD-10-CM | POA: Diagnosis not present

## 2020-12-27 ENCOUNTER — Other Ambulatory Visit: Payer: Self-pay | Admitting: Physician Assistant

## 2020-12-27 ENCOUNTER — Other Ambulatory Visit (HOSPITAL_COMMUNITY): Payer: Self-pay

## 2020-12-27 DIAGNOSIS — I10 Essential (primary) hypertension: Secondary | ICD-10-CM

## 2020-12-27 MED ORDER — LOSARTAN POTASSIUM 50 MG PO TABS
ORAL_TABLET | ORAL | 0 refills | Status: DC
  Filled 2020-12-27: qty 90, 90d supply, fill #0

## 2021-01-22 DIAGNOSIS — Z48817 Encounter for surgical aftercare following surgery on the skin and subcutaneous tissue: Secondary | ICD-10-CM | POA: Diagnosis not present

## 2021-01-31 ENCOUNTER — Other Ambulatory Visit: Payer: Self-pay

## 2021-01-31 DIAGNOSIS — Z130 Encounter for screening for diseases of the blood and blood-forming organs and certain disorders involving the immune mechanism: Secondary | ICD-10-CM

## 2021-01-31 DIAGNOSIS — I10 Essential (primary) hypertension: Secondary | ICD-10-CM

## 2021-01-31 DIAGNOSIS — E782 Mixed hyperlipidemia: Secondary | ICD-10-CM

## 2021-01-31 DIAGNOSIS — Z13228 Encounter for screening for other metabolic disorders: Secondary | ICD-10-CM

## 2021-01-31 DIAGNOSIS — Z1329 Encounter for screening for other suspected endocrine disorder: Secondary | ICD-10-CM

## 2021-01-31 DIAGNOSIS — Z0000 Encounter for general adult medical examination without abnormal findings: Secondary | ICD-10-CM

## 2021-02-01 ENCOUNTER — Other Ambulatory Visit: Payer: PPO

## 2021-02-01 ENCOUNTER — Other Ambulatory Visit: Payer: Self-pay

## 2021-02-01 DIAGNOSIS — Z1329 Encounter for screening for other suspected endocrine disorder: Secondary | ICD-10-CM | POA: Diagnosis not present

## 2021-02-01 DIAGNOSIS — I10 Essential (primary) hypertension: Secondary | ICD-10-CM

## 2021-02-01 DIAGNOSIS — Z130 Encounter for screening for diseases of the blood and blood-forming organs and certain disorders involving the immune mechanism: Secondary | ICD-10-CM | POA: Diagnosis not present

## 2021-02-01 DIAGNOSIS — Z1321 Encounter for screening for nutritional disorder: Secondary | ICD-10-CM | POA: Diagnosis not present

## 2021-02-01 DIAGNOSIS — E782 Mixed hyperlipidemia: Secondary | ICD-10-CM

## 2021-02-01 DIAGNOSIS — Z13228 Encounter for screening for other metabolic disorders: Secondary | ICD-10-CM | POA: Diagnosis not present

## 2021-02-01 DIAGNOSIS — Z0000 Encounter for general adult medical examination without abnormal findings: Secondary | ICD-10-CM | POA: Diagnosis not present

## 2021-02-02 LAB — COMPREHENSIVE METABOLIC PANEL
ALT: 33 IU/L (ref 0–44)
AST: 51 IU/L — ABNORMAL HIGH (ref 0–40)
Albumin/Globulin Ratio: 2.5 — ABNORMAL HIGH (ref 1.2–2.2)
Albumin: 4.9 g/dL — ABNORMAL HIGH (ref 3.8–4.8)
Alkaline Phosphatase: 81 IU/L (ref 44–121)
BUN/Creatinine Ratio: 7 — ABNORMAL LOW (ref 10–24)
BUN: 6 mg/dL — ABNORMAL LOW (ref 8–27)
Bilirubin Total: 0.5 mg/dL (ref 0.0–1.2)
CO2: 22 mmol/L (ref 20–29)
Calcium: 9.5 mg/dL (ref 8.6–10.2)
Chloride: 95 mmol/L — ABNORMAL LOW (ref 96–106)
Creatinine, Ser: 0.83 mg/dL (ref 0.76–1.27)
Globulin, Total: 2 g/dL (ref 1.5–4.5)
Glucose: 70 mg/dL (ref 70–99)
Potassium: 4.5 mmol/L (ref 3.5–5.2)
Sodium: 134 mmol/L (ref 134–144)
Total Protein: 6.9 g/dL (ref 6.0–8.5)
eGFR: 95 mL/min/{1.73_m2} (ref >59)

## 2021-02-02 LAB — CBC WITH DIFFERENTIAL/PLATELET
Basophils Absolute: 0.1 10*3/uL (ref 0.0–0.2)
Basos: 0 %
EOS (ABSOLUTE): 0.1 10*3/uL (ref 0.0–0.4)
Eos: 0 %
Hematocrit: 40.1 % (ref 37.5–51.0)
Hemoglobin: 13 g/dL (ref 13.0–17.7)
Immature Grans (Abs): 0 10*3/uL (ref 0.0–0.1)
Immature Granulocytes: 0 %
Lymphocytes Absolute: 47.7 10*3/uL — ABNORMAL HIGH (ref 0.7–3.1)
Lymphs: 89 %
MCH: 31.8 pg (ref 26.6–33.0)
MCHC: 32.4 g/dL (ref 31.5–35.7)
MCV: 98 fL — ABNORMAL HIGH (ref 79–97)
Monocytes Absolute: 2.3 10*3/uL — ABNORMAL HIGH (ref 0.1–0.9)
Monocytes: 4 %
Neutrophils Absolute: 3.4 10*3/uL (ref 1.4–7.0)
Neutrophils: 7 %
Platelets: 185 10*3/uL (ref 150–450)
RBC: 4.09 x10E6/uL — ABNORMAL LOW (ref 4.14–5.80)
RDW: 12.3 % (ref 11.6–15.4)
WBC: 53.5 10*3/uL (ref 3.4–10.8)

## 2021-02-02 LAB — LIPID PANEL
Chol/HDL Ratio: 2.2 ratio (ref 0.0–5.0)
Cholesterol, Total: 200 mg/dL — ABNORMAL HIGH (ref 100–199)
HDL: 93 mg/dL (ref >39)
LDL Chol Calc (NIH): 98 mg/dL (ref 0–99)
Triglycerides: 50 mg/dL (ref 0–149)
VLDL Cholesterol Cal: 9 mg/dL (ref 5–40)

## 2021-02-02 LAB — HEMOGLOBIN A1C
Est. average glucose Bld gHb Est-mCnc: 108 mg/dL
Hgb A1c MFr Bld: 5.4 % (ref 4.8–5.6)

## 2021-02-02 LAB — TSH: TSH: 1.79 u[IU]/mL (ref 0.450–4.500)

## 2021-02-03 ENCOUNTER — Other Ambulatory Visit: Payer: Self-pay | Admitting: Nurse Practitioner

## 2021-02-03 NOTE — Progress Notes (Signed)
Patient with critical WBC of 53.7 on 02/02/2021. He does have history of CLL and this appears to be baseline.

## 2021-02-06 ENCOUNTER — Encounter: Payer: Self-pay | Admitting: Physician Assistant

## 2021-02-06 ENCOUNTER — Other Ambulatory Visit: Payer: Self-pay

## 2021-02-06 ENCOUNTER — Other Ambulatory Visit (HOSPITAL_COMMUNITY): Payer: Self-pay

## 2021-02-06 ENCOUNTER — Ambulatory Visit (INDEPENDENT_AMBULATORY_CARE_PROVIDER_SITE_OTHER): Payer: PPO | Admitting: Physician Assistant

## 2021-02-06 VITALS — BP 110/82 | HR 84 | Temp 98.3°F | Ht 68.0 in | Wt 166.0 lb

## 2021-02-06 DIAGNOSIS — E782 Mixed hyperlipidemia: Secondary | ICD-10-CM | POA: Diagnosis not present

## 2021-02-06 DIAGNOSIS — C9110 Chronic lymphocytic leukemia of B-cell type not having achieved remission: Secondary | ICD-10-CM

## 2021-02-06 DIAGNOSIS — I10 Essential (primary) hypertension: Secondary | ICD-10-CM

## 2021-02-06 DIAGNOSIS — K219 Gastro-esophageal reflux disease without esophagitis: Secondary | ICD-10-CM

## 2021-02-06 DIAGNOSIS — Z0000 Encounter for general adult medical examination without abnormal findings: Secondary | ICD-10-CM

## 2021-02-06 MED ORDER — LOSARTAN POTASSIUM 50 MG PO TABS
50.0000 mg | ORAL_TABLET | Freq: Every day | ORAL | 1 refills | Status: DC
  Filled 2021-02-06 – 2021-04-04 (×2): qty 90, 90d supply, fill #0
  Filled 2021-07-31: qty 90, 90d supply, fill #1

## 2021-02-06 MED ORDER — ESOMEPRAZOLE MAGNESIUM 20 MG PO CPDR
DELAYED_RELEASE_CAPSULE | Freq: Every day | ORAL | 1 refills | Status: DC
  Filled 2021-02-06: qty 90, 90d supply, fill #0
  Filled 2021-06-04: qty 90, 90d supply, fill #1

## 2021-02-06 MED ORDER — ROSUVASTATIN CALCIUM 40 MG PO TABS
ORAL_TABLET | Freq: Every day | ORAL | 1 refills | Status: DC
  Filled 2021-02-06: qty 90, 90d supply, fill #0
  Filled 2021-06-04: qty 90, 90d supply, fill #1

## 2021-02-06 NOTE — Patient Instructions (Signed)
Preventive Care 65 Years and Older, Male °Preventive care refers to lifestyle choices and visits with your health care provider that can promote health and wellness. Preventive care visits are also called wellness exams. °What can I expect for my preventive care visit? °Counseling °During your preventive care visit, your health care provider may ask about your: °Medical history, including: °Past medical problems. °Family medical history. °History of falls. °Current health, including: °Emotional well-being. °Home life and relationship well-being. °Sexual activity. °Memory and ability to understand (cognition). °Lifestyle, including: °Alcohol, nicotine or tobacco, and drug use. °Access to firearms. °Diet, exercise, and sleep habits. °Work and work environment. °Sunscreen use. °Safety issues such as seatbelt and bike helmet use. °Physical exam °Your health care provider will check your: °Height and weight. These may be used to calculate your BMI (body mass index). BMI is a measurement that tells if you are at a healthy weight. °Waist circumference. This measures the distance around your waistline. This measurement also tells if you are at a healthy weight and may help predict your risk of certain diseases, such as type 2 diabetes and high blood pressure. °Heart rate and blood pressure. °Body temperature. °Skin for abnormal spots. °What immunizations do I need? °Vaccines are usually given at various ages, according to a schedule. Your health care provider will recommend vaccines for you based on your age, medical history, and lifestyle or other factors, such as travel or where you work. °What tests do I need? °Screening °Your health care provider may recommend screening tests for certain conditions. This may include: °Lipid and cholesterol levels. °Diabetes screening. This is done by checking your blood sugar (glucose) after you have not eaten for a while (fasting). °Hepatitis C test. °Hepatitis B test. °HIV (human  immunodeficiency virus) test. °STI (sexually transmitted infection) testing, if you are at risk. °Lung cancer screening. °Colorectal cancer screening. °Prostate cancer screening. °Abdominal aortic aneurysm (AAA) screening. You may need this if you are a current or former smoker. °Talk with your health care provider about your test results, treatment options, and if necessary, the need for more tests. °Follow these instructions at home: °Eating and drinking ° °Eat a diet that includes fresh fruits and vegetables, whole grains, lean protein, and low-fat dairy products. Limit your intake of foods with high amounts of sugar, saturated fats, and salt. °Take vitamin and mineral supplements as recommended by your health care provider. °Do not drink alcohol if your health care provider tells you not to drink. °If you drink alcohol: °Limit how much you have to 0-2 drinks a day. °Know how much alcohol is in your drink. In the [place] one drink equals one 12 oz bottle of beer (355 mL), one 5 oz glass of wine (148 mL), or one 1½ oz glass of hard liquor (44 mL). °Lifestyle °Brush your teeth every morning and night with fluoride toothpaste. Floss one time each day. °Exercise for at least 30 minutes 5 or more days each week. °Do not use any products that contain nicotine or tobacco. These products include cigarettes, chewing tobacco, and vaping devices, such as e-cigarettes. If you need help quitting, ask your health care provider. °Do not use drugs. °If you are sexually active, practice safe sex. Use a condom or other form of protection to prevent STIs. °Take aspirin only as told by your health care provider. Make sure that you understand how much to take and what form to take. Work with your health care provider to find out whether it is safe and   beneficial for you to take aspirin daily. °Ask your health care provider if you need to take a cholesterol-lowering medicine (statin). °Find healthy ways to manage stress, such  as: °Meditation, yoga, or listening to music. °Journaling. °Talking to a trusted person. °Spending time with friends and family. °Safety °Always wear your seat belt while driving or riding in a vehicle. °Do not drive: °If you have been drinking alcohol. Do not ride with someone who has been drinking. °When you are tired or distracted. °While texting. °If you have been using any mind-altering substances or drugs. °Wear a helmet and other protective equipment during sports activities. °If you have firearms in your house, make sure you follow all gun safety procedures. °Minimize exposure to UV radiation to reduce your risk of skin cancer. °What's next? °Visit your health care provider once a year for an annual wellness visit. °Ask your health care provider how often you should have your eyes and teeth checked. °Stay up to date on all vaccines. °This information is not intended to replace advice given to you by your health care provider. Make sure you discuss any questions you have with your health care provider. °Document Revised: 09/05/2020 Document Reviewed: 09/05/2020 °Elsevier Patient Education © 2022 [employer] ° °

## 2021-02-06 NOTE — Progress Notes (Signed)
Subjective:   George [name] is a 70 y.o. male who presents for Medicare Annual/Subsequent preventive examination.  Review of Systems    General:   No F/C, wt loss Pulm:   No DIB, SOB, pleuritic chest pain Card:  No CP, palpitations Abd:  No n/v/d or pain Ext:  No inc edema from baseline  Objective:    Today's Vitals   02/06/21 1031 02/06/21 1055  BP: (!) 172/105 110/82  Pulse: 84   Temp: 98.3 F (36.8 C)   SpO2: 98%   Weight: 166 lb (75.3 kg)   Height: 5\' 8"  (1.727 m)    Body mass index is 25.24 kg/m.  Advanced Directives 12/19/2020 09/21/2019 09/17/2018 03/03/2018 03/04/2017 09/02/2016 07/24/2016  Does Patient Have a Medical Advance Directive? Yes Yes Yes Yes Yes No No  Type of Paramedic of [place];Living will [place];Living will [place];Living will [place];Living will [place];Living will - -  Does patient want to make changes to medical advance directive? No - Patient declined No - Patient declined - - No - Patient declined - -  Copy of [place] in Chart? No - copy requested No - copy requested - - No - copy requested - -  Would patient like information on creating a medical advance directive? - - - - - - -    Current Medications (verified) Outpatient Encounter Medications as of 02/06/2021  Medication Sig   cholecalciferol (VITAMIN D) 1000 UNITS tablet Take 1,000 Units by mouth daily. 2 tablets daily   Coenzyme Q10 (CO Q 10 PO) Take by mouth every morning.    esomeprazole (NEXIUM) 20 MG capsule TAKE 1 CAPSULE BY MOUTH ONCE A DAY   fluticasone (FLONASE) 50 MCG/ACT nasal spray 1 spray each nostril following sinus rinses twice daily   GLUCOSAMINE HCL PO Take by mouth every morning.    Green Tea, Camillia sinensis, (GREEN TEA PO) Take 315 mg by mouth every morning.    lactobacillus acidophilus (BACID) TABS tablet Take 1 tablet by mouth daily.     losartan (COZAAR) 50 MG tablet TAKE 1 TABLET BY MOUTH ONCE A DAY. NEED APPT FOR REFILLS.   Multiple Vitamin (MULTI-VITAMIN PO) Take by mouth every morning.    mupirocin ointment (BACTROBAN) 2 % Apply topically twice daily   NIACIN CR PO Take 500 mg by mouth daily.    Omega-3 Fatty Acids (FISH OIL CONCENTRATE PO) Take by mouth every morning.    psyllium (METAMUCIL) 58.6 % powder Take 1 packet by mouth 2 (two) times daily.    rosuvastatin (CRESTOR) 40 MG tablet TAKE 1 TABLET BY MOUTH ONCE A DAY   Zinc 30 MG TABS Take by mouth every morning.   [DISCONTINUED] losartan (COZAAR) 50 MG tablet Take 1 tablet (50 mg total) by mouth daily.   No facility-administered encounter medications on file as of 02/06/2021.    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   Colon polyp    Elevated BP 04/23/2014   GERD (gastroesophageal reflux disease)    High cholesterol    History of chicken pox    childhood   Preventative health care 10/23/2013   Tremor of right hand 04/17/2014   Past Surgical History:  Procedure Laterality Date   COLONOSCOPY     ESOPHAGEAL DILATION  2005   [facility]   HERNIA REPAIR  right groin   POLYPECTOMY     Family History  Problem Relation Age of Onset   Hyperlipidemia Mother    Hypertension Mother        mother-father   Pulmonary fibrosis Mother    Diabetes Father    Heart disease Father        congestive heart failure   Other Father        brain tumor   Mental illness Sister        depression   GER disease Son    Stroke Maternal Grandmother    Diabetes Maternal Grandmother    Diabetes Other        mother -father   Prostate cancer Neg Hx    Breast cancer Neg Hx    Colon cancer Neg Hx    Social History   Socioeconomic History   Marital status: Married    Spouse name: Not on file   Number of children: Not on file   Years of education: Not on file   Highest education  level: Not on file  Occupational History   Not on file  Tobacco Use   Smoking status: Former    Packs/day: 1.00    Years: 22.00    Pack years: 22.00    Types: Cigarettes    Start date: 02/05/1970    Quit date: 03/07/1992    Years since quitting: 28.9   Smokeless tobacco: Never   Tobacco comments:    quit 21 years ago  Vaping Use   Vaping Use: Never used  Substance and Sexual Activity   Alcohol use: Yes    Alcohol/week: 6.0 standard drinks    Types: 6 Cans of beer per week    Comment: social   Drug use: No   Sexual activity: Yes    Comment: lives with wife, no dietary restirctions  Other Topics Concern   Not on file  Social History Narrative   Not on file   Social Determinants of Health   Financial Resource Strain: Not on file  Food Insecurity: Not on file  Transportation Needs: Not on file  Physical Activity: Not on file  Stress: Not on file  Social Connections: Not on file    Tobacco Counseling Counseling given: Not Answered Tobacco comments: quit 21 years ago     Diabetic? no   Activities of Daily Living In your present state of health, do you have any difficulty performing the following activities: 02/06/2021 10/09/2020  Hearing? N N  Vision? N N  Difficulty concentrating or making decisions? N N  Walking or climbing stairs? N N  Dressing or bathing? N N  Doing errands, shopping? N N  Some recent data might be hidden    Patient Care Team: Lorrene Reid, PA-C as PCP - General [place], Jennefer Bravo, MD as Referring Physician (Dermatology) Volanda Napoleon, MD as Consulting Physician (Oncology) Mansouraty, Telford Nab., MD as Consulting Physician (Gastroenterology) Eppie Gibson, MD as Consulting Physician (Radiation Oncology) Malmfelt, Stephani Police, RN as Oncology Nurse Navigator Harl Bowie, MD as Referring Physician (Dermatology)  Indicate any recent Medical Services you may have received from other than [facility] providers in the past year  (date may be approximate).     Assessment:   This is a routine wellness examination for [place].  Hearing/Vision screen No results found.  Dietary issues and exercise activities discussed: -Follows a heart healthy diet and stays active with outdoor activities.    Goals Addressed   None   Depression Screen PHQ  2/9 Scores 02/06/2021 10/09/2020 03/07/2020 09/28/2019 06/15/2019 04/13/2019 09/22/2018  PHQ - 2 Score 0 0 0 0 0 0 0  PHQ- 9 Score 0 0 0 0 0 0 0    Fall Risk Fall Risk  02/06/2021 10/09/2020 03/07/2020 06/15/2019 04/13/2019  Falls in the past year? 0 0 0 0 0  Number falls in past yr: 0 0 - 0 0  Injury with Fall? 0 0 - 0 0  Risk for fall due to : No Fall Risks No Fall Risks - - -  Follow up Falls evaluation completed Falls evaluation completed Falls evaluation completed Falls evaluation completed Falls evaluation completed    [place]:  Any stairs in or around the home? Yes  If so, are there any without handrails? No  Home free of loose throw rugs in walkways, pet beds, electrical cords, etc? Yes  Adequate lighting in your home to reduce risk of falls? No   ASSISTIVE DEVICES UTILIZED TO PREVENT FALLS:  Life alert? No  Use of a cane, walker or w/c? No  Grab bars in the bathroom? No  Shower chair or bench in shower? No  Elevated toilet seat or a handicapped toilet? No   TIMED UP AND GO:  Was the test performed? Yes .  Length of time to ambulate 10 feet: 10 sec.   Gait steady and fast without use of assistive device  Cognitive Function: wnl's      6CIT Screen 02/06/2021 06/15/2019  What Year? 0 points 0 points  What month? 0 points 0 points  What time? 0 points 0 points  Count back from 20 0 points 0 points  Months in reverse 0 points 0 points  Repeat phrase 0 points 0 points  Total Score 0 0    Immunizations Immunization History  Administered Date(s) Administered   Fluad Quad(high Dose 65+) 01/14/2019   Influenza Whole  01/22/2013   Influenza, High Dose Seasonal PF 01/09/2017, 01/06/2018   Influenza,inj,Quad PF,6+ Mos 12/26/2015   Influenza-Unspecified 12/09/2010, 12/27/2013, 12/29/2014   Pneumococcal Conjugate-13 03/08/2015   Pneumococcal-Unspecified 02/12/2010   Tdap 02/13/2011   Zoster Recombinat (Shingrix) 03/15/2018, 05/20/2018    TDAP status: Up to date  Flu Vaccine status: Declined, Education has been provided regarding the importance of this vaccine but patient still declined. Advised may receive this vaccine at local pharmacy or Health Dept. Aware to provide a copy of the vaccination record if obtained from local pharmacy or Health Dept. Verbalized acceptance and understanding.  Pneumococcal vaccine status: Up to date  Covid-19 vaccine status: Declined, Education has been provided regarding the importance of this vaccine but patient still declined. Advised may receive this vaccine at local pharmacy or Health Dept.or vaccine clinic. Aware to provide a copy of the vaccination record if obtained from local pharmacy or Health Dept. Verbalized acceptance and understanding.  Qualifies for Shingles Vaccine? Yes   Zostavax completed No   Shingrix Completed?: Yes  Screening Tests Health Maintenance  Topic Date Due   COVID-19 Vaccine (1) Never done   Pneumonia Vaccine 72+ Years old (2 - PPSV23 if available, else PCV20) 03/07/2016   COLONOSCOPY (Pts 45-15yrs Insurance coverage will need to be confirmed)  09/08/2018   INFLUENZA VACCINE  06/21/2021 (Originally 10/22/2020)   TETANUS/TDAP  02/12/2021   Hepatitis C Screening  Completed   Zoster Vaccines- Shingrix  Completed   HPV VACCINES  Aged Out    Health Maintenance  Health Maintenance Due  Topic Date Due  COVID-19 Vaccine (1) Never done   Pneumonia Vaccine 41+ Years old (2 - PPSV23 if available, else PCV20) 03/07/2016   COLONOSCOPY (Pts 45-61yrs Insurance coverage will need to be confirmed)  09/08/2018    Colorectal cancer screening: Type of  screening: Colonoscopy. Completed 09/07/2013. Repeat every 5 years. Patient plans to repeat colonoscopy after completes skin cancer treatment.   Lung Cancer Screening: (Low Dose CT Chest recommended if Age 28-80 years, 30 pack-year currently smoking OR have quit w/in 15years.) does not qualify.   Lung Cancer Screening Referral: n/a  Additional Screening:  Hepatitis C Screening: does qualify; Completed 08/26/2016   Vision Screening: Recommended annual ophthalmology exams for early detection of glaucoma and other disorders of the eye. Is the patient up to date with their annual eye exam?  yes, goes every 2 yrs Who is the provider or what is the name of the office in which the patient attends annual eye exams? Unable to recall  If pt is not established with a provider, would they like to be referred to a provider to establish care? No .   Dental Screening: Recommended annual dental exams for proper oral hygiene  Community Resource Referral / Chronic Care Management: CRR required this visit?  No   CCM required this visit?  No      Plan:  -Discussed most recent labs which are essentially within normal limits or stable from prior. Liver enzyme AST mildly elevated, reviewed prior labs and has been elevated in the past and stable. Patient does report recent use of acetaminophen and ibuprofen post Mohs surgery. Will continue to monitor and repeat hepatic function at follow up visit.  -BP initially elevated, BP rechecked and improved.  -Continue current medication regimen. -Follow up in 6 months for HTN, HLD and repeat CMP  I have personally reviewed and noted the following in the patient's chart:   Medical and social history Use of alcohol, tobacco or illicit drugs  Current medications and supplements including opioid prescriptions. Patient is not currently taking opioid prescriptions. Functional ability and status Nutritional status Physical activity Advanced directives List of other  physicians Hospitalizations, surgeries, and ER visits in previous 12 months Vitals Screenings to include cognitive, depression, and falls Referrals and appointments  In addition, I have reviewed and discussed with patient certain preventive protocols, quality metrics, and best practice recommendations. A written personalized care plan for preventive services as well as general preventive health recommendations were provided to patient.     Lorrene Reid, PA-C   02/06/2021

## 2021-02-07 DIAGNOSIS — D225 Melanocytic nevi of trunk: Secondary | ICD-10-CM | POA: Diagnosis not present

## 2021-02-07 DIAGNOSIS — L578 Other skin changes due to chronic exposure to nonionizing radiation: Secondary | ICD-10-CM | POA: Diagnosis not present

## 2021-02-07 DIAGNOSIS — Z23 Encounter for immunization: Secondary | ICD-10-CM | POA: Diagnosis not present

## 2021-02-07 DIAGNOSIS — Z85828 Personal history of other malignant neoplasm of skin: Secondary | ICD-10-CM | POA: Diagnosis not present

## 2021-02-07 DIAGNOSIS — L570 Actinic keratosis: Secondary | ICD-10-CM | POA: Diagnosis not present

## 2021-02-07 DIAGNOSIS — L821 Other seborrheic keratosis: Secondary | ICD-10-CM | POA: Diagnosis not present

## 2021-02-07 DIAGNOSIS — L814 Other melanin hyperpigmentation: Secondary | ICD-10-CM | POA: Diagnosis not present

## 2021-02-26 DIAGNOSIS — Z48817 Encounter for surgical aftercare following surgery on the skin and subcutaneous tissue: Secondary | ICD-10-CM | POA: Diagnosis not present

## 2021-04-02 DIAGNOSIS — Z48817 Encounter for surgical aftercare following surgery on the skin and subcutaneous tissue: Secondary | ICD-10-CM | POA: Diagnosis not present

## 2021-04-04 ENCOUNTER — Other Ambulatory Visit (HOSPITAL_COMMUNITY): Payer: Self-pay

## 2021-04-08 NOTE — Progress Notes (Signed)
Oncology Nurse Navigator Documentation   I received an incoming call from Mr. Krisko just now requesting that his scheduled CT simulation for tomorrow and upcoming radiation treatments be cancelled because he would like to postpone treatment at this time. I asked him for a further details about his decision and he would only say that he wanted to postpone treatment and would call me when he had made a decision to start treatment. I have notified CT simulation, LINAC 3, and Dr. Isidore Moos to cancel his upcoming appointments as patient requested.   Harlow Asa RN, BSN, OCN Head & Neck Oncology Nurse [place] at [facility] Phone # [phone]  Fax # [phone]

## 2021-04-09 ENCOUNTER — Ambulatory Visit: Payer: PPO | Admitting: Radiation Oncology

## 2021-04-17 ENCOUNTER — Ambulatory Visit: Payer: PPO | Admitting: Radiation Oncology

## 2021-04-18 ENCOUNTER — Ambulatory Visit: Payer: PPO

## 2021-04-19 ENCOUNTER — Ambulatory Visit: Payer: PPO

## 2021-04-22 ENCOUNTER — Ambulatory Visit: Payer: PPO

## 2021-04-23 ENCOUNTER — Ambulatory Visit: Payer: PPO

## 2021-04-24 ENCOUNTER — Ambulatory Visit: Payer: PPO

## 2021-04-25 ENCOUNTER — Ambulatory Visit: Payer: PPO

## 2021-04-26 ENCOUNTER — Ambulatory Visit: Payer: PPO

## 2021-04-29 ENCOUNTER — Ambulatory Visit: Payer: PPO

## 2021-04-30 ENCOUNTER — Ambulatory Visit: Payer: PPO

## 2021-05-01 ENCOUNTER — Ambulatory Visit: Payer: PPO

## 2021-05-02 ENCOUNTER — Ambulatory Visit: Payer: PPO

## 2021-05-03 ENCOUNTER — Ambulatory Visit: Payer: PPO

## 2021-05-06 ENCOUNTER — Ambulatory Visit: Payer: PPO

## 2021-05-07 ENCOUNTER — Ambulatory Visit: Payer: PPO

## 2021-05-08 ENCOUNTER — Ambulatory Visit: Payer: PPO

## 2021-05-09 ENCOUNTER — Ambulatory Visit: Payer: PPO

## 2021-05-10 ENCOUNTER — Ambulatory Visit: Payer: PPO

## 2021-05-13 ENCOUNTER — Ambulatory Visit: Payer: PPO

## 2021-05-14 ENCOUNTER — Ambulatory Visit: Payer: PPO

## 2021-06-04 ENCOUNTER — Other Ambulatory Visit (HOSPITAL_COMMUNITY): Payer: Self-pay

## 2021-06-05 ENCOUNTER — Other Ambulatory Visit (HOSPITAL_COMMUNITY): Payer: Self-pay

## 2021-06-12 NOTE — Progress Notes (Signed)
Oncology Nurse Navigator Documentation  ? ?At Dr. Pearlie Oyster request I have called Mr. 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 I left my direct contact information to call me back.  ? ?Harlow Asa RN, BSN, OCN ?Head & Neck Oncology Nurse Navigator ?[place] at [facility] ?Phone # [phone]  ?Fax # [phone]  ?

## 2021-07-31 ENCOUNTER — Other Ambulatory Visit (HOSPITAL_COMMUNITY): Payer: Self-pay

## 2021-08-02 ENCOUNTER — Other Ambulatory Visit (HOSPITAL_COMMUNITY): Payer: Self-pay

## 2021-08-05 ENCOUNTER — Encounter: Payer: Self-pay | Admitting: Physician Assistant

## 2021-08-05 ENCOUNTER — Other Ambulatory Visit (HOSPITAL_COMMUNITY): Payer: Self-pay

## 2021-08-05 ENCOUNTER — Ambulatory Visit (INDEPENDENT_AMBULATORY_CARE_PROVIDER_SITE_OTHER): Payer: PPO | Admitting: Physician Assistant

## 2021-08-05 VITALS — BP 121/77 | HR 71 | Temp 97.7°F | Ht 68.0 in | Wt 165.0 lb

## 2021-08-05 DIAGNOSIS — Z85828 Personal history of other malignant neoplasm of skin: Secondary | ICD-10-CM | POA: Insufficient documentation

## 2021-08-05 DIAGNOSIS — D225 Melanocytic nevi of trunk: Secondary | ICD-10-CM | POA: Insufficient documentation

## 2021-08-05 DIAGNOSIS — L821 Other seborrheic keratosis: Secondary | ICD-10-CM | POA: Insufficient documentation

## 2021-08-05 DIAGNOSIS — K219 Gastro-esophageal reflux disease without esophagitis: Secondary | ICD-10-CM

## 2021-08-05 DIAGNOSIS — C9110 Chronic lymphocytic leukemia of B-cell type not having achieved remission: Secondary | ICD-10-CM | POA: Diagnosis not present

## 2021-08-05 DIAGNOSIS — I10 Essential (primary) hypertension: Secondary | ICD-10-CM | POA: Diagnosis not present

## 2021-08-05 DIAGNOSIS — E782 Mixed hyperlipidemia: Secondary | ICD-10-CM

## 2021-08-05 DIAGNOSIS — L814 Other melanin hyperpigmentation: Secondary | ICD-10-CM | POA: Insufficient documentation

## 2021-08-05 MED ORDER — ROSUVASTATIN CALCIUM 40 MG PO TABS
ORAL_TABLET | Freq: Every day | ORAL | 1 refills | Status: DC
  Filled 2021-08-05: qty 32, 32d supply, fill #0
  Filled 2021-08-12: qty 58, 58d supply, fill #0
  Filled 2021-12-12: qty 90, 90d supply, fill #1

## 2021-08-05 MED ORDER — LOSARTAN POTASSIUM 50 MG PO TABS
50.0000 mg | ORAL_TABLET | Freq: Every day | ORAL | 1 refills | Status: DC
  Filled 2021-08-05 – 2021-11-02 (×2): qty 90, 90d supply, fill #0
  Filled 2022-01-30: qty 90, 90d supply, fill #1

## 2021-08-05 MED ORDER — ESOMEPRAZOLE MAGNESIUM 20 MG PO CPDR
DELAYED_RELEASE_CAPSULE | Freq: Every day | ORAL | 1 refills | Status: DC
  Filled 2021-08-05: qty 90, 90d supply, fill #0
  Filled 2021-12-20: qty 90, 90d supply, fill #1

## 2021-08-05 NOTE — Assessment & Plan Note (Signed)
-  Stable. -Continue current medication regimen.  -Will continue to monitor. 

## 2021-08-05 NOTE — Assessment & Plan Note (Signed)
-  Last CBC, WBC 53.5. Stable. ?-Will repeat CBC and continue to monitor. ?

## 2021-08-05 NOTE — Assessment & Plan Note (Signed)
-  Controlled. -Continue current medication regimen. -Will collect CMP for medication monitoring. -Will continue to monitor. 

## 2021-08-05 NOTE — Assessment & Plan Note (Signed)
-  Last lipid panel: HDL 93, LDL 98 ?-Recommend to follow a low fat diet. ?-Continue current medication regimen. ?-Will continue to monitor.  ?

## 2021-08-05 NOTE — Progress Notes (Signed)
?Established patient visit ? ? ?Patient: George [name]   DOB: 1950/06/22   71 y.o. Male  MRN: [id number] ?Visit Date: 08/05/2021 ? ?Chief Complaint  ?Patient presents with  ? Hypertension  ? Hyperlipidemia  ? ?Subjective  ?  ?HPI  ?Patient presents for chronic follow-up. ? ? ?HTN: Pt denies chest pain, palpitations, dizziness, shortness of breath, or lower extremity swelling. States his right ankle sometimes does swell. Taking medication as directed without side effects. Reports good hydration.  ? ?HLD: Pt taking medication as directed without issues. No dietary changes. Staying active with yard work.  ? ?GERD: Reports medication helps keep symptoms controlled. Takes esomeprazole daily.  ? ? ?Medications: ?Outpatient Medications Prior to Visit  ?Medication Sig  ? cholecalciferol (VITAMIN D) 1000 UNITS tablet Take 1,000 Units by mouth daily. 2 tablets daily  ? Coenzyme Q10 (CO Q 10 PO) Take by mouth every morning.   ? fluticasone (FLONASE) 50 MCG/ACT nasal spray 1 spray each nostril following sinus rinses twice daily  ? GLUCOSAMINE HCL PO Take by mouth every morning.   ? Green Tea, Camillia sinensis, (GREEN TEA PO) Take 315 mg by mouth every morning.   ? lactobacillus acidophilus (BACID) TABS tablet Take 1 tablet by mouth daily.   ? Multiple Vitamin (MULTI-VITAMIN PO) Take by mouth every morning.   ? mupirocin ointment (BACTROBAN) 2 % Apply topically twice daily  ? NIACIN CR PO Take 500 mg by mouth daily.   ? Omega-3 Fatty Acids (FISH OIL CONCENTRATE PO) Take by mouth every morning.   ? psyllium (METAMUCIL) 58.6 % powder Take 1 packet by mouth 2 (two) times daily.   ? Zinc 30 MG TABS Take by mouth every morning.  ? [DISCONTINUED] esomeprazole (NEXIUM) 20 MG capsule TAKE 1 CAPSULE BY MOUTH ONCE A DAY  ? [DISCONTINUED] losartan (COZAAR) 50 MG tablet Take 1 tablet (50 mg total) by mouth daily.  ? [DISCONTINUED] rosuvastatin (CRESTOR) 40 MG tablet TAKE 1 TABLET BY MOUTH ONCE A DAY  ? ?No facility-administered  medications prior to visit.  ? ? ?Review of Systems ?Review of Systems:  ?A fourteen system review of systems was performed and found to be positive as per HPI. ? ?Last CBC ?Lab Results  ?Component Value Date  ? WBC 53.5 (HH) 02/01/2021  ? HGB 13.0 02/01/2021  ? HCT 40.1 02/01/2021  ? MCV 98 (H) 02/01/2021  ? MCH 31.8 02/01/2021  ? RDW 12.3 02/01/2021  ? PLT 185 02/01/2021  ? ?Last metabolic panel ?Lab Results  ?Component Value Date  ? GLUCOSE 70 02/01/2021  ? NA 134 02/01/2021  ? K 4.5 02/01/2021  ? CL 95 (L) 02/01/2021  ? CO2 22 02/01/2021  ? BUN 6 (L) 02/01/2021  ? CREATININE 0.83 02/01/2021  ? EGFR 95 02/01/2021  ? CALCIUM 9.5 02/01/2021  ? PHOS 2.7 10/03/2013  ? PROT 6.9 02/01/2021  ? ALBUMIN 4.9 (H) 02/01/2021  ? LABGLOB 2.0 02/01/2021  ? AGRATIO 2.5 (H) 02/01/2021  ? BILITOT 0.5 02/01/2021  ? ALKPHOS 81 02/01/2021  ? AST 51 (H) 02/01/2021  ? ALT 33 02/01/2021  ? ANIONGAP 8 09/20/2020  ? ?Last lipids ?Lab Results  ?Component Value Date  ? CHOL 200 (H) 02/01/2021  ? HDL 93 02/01/2021  ? [place] 98 02/01/2021  ? TRIG 50 02/01/2021  ? CHOLHDL 2.2 02/01/2021  ? ?Last hemoglobin A1c ?Lab Results  ?Component Value Date  ? HGBA1C 5.4 02/01/2021  ? ?Last thyroid functions ?Lab Results  ?Component Value Date  ? TSH  1.790 02/01/2021  ? ?Last vitamin D ?Lab Results  ?Component Value Date  ? VD25OH 61.2 06/15/2019  ? ?Last vitamin B12 and Folate ?Lab Results  ?Component Value Date  ? [id number] 347 08/26/2016  ? ?  Objective  ?  ?BP 121/77   Pulse 71   Temp 97.7 ?F (36.5 ?C)   Ht 5' 8"  (1.727 m)   Wt 165 lb (74.8 kg)   SpO2 98%   BMI 25.09 kg/m?  ?BP Readings from Last 3 Encounters:  ?08/05/21 121/77  ?02/06/21 110/82  ?12/19/20 137/90  ? ?Wt Readings from Last 3 Encounters:  ?08/05/21 165 lb (74.8 kg)  ?02/06/21 166 lb (75.3 kg)  ?12/19/20 152 lb 12.8 oz (69.3 kg)  ? ? ?Physical Exam  ?General:  Well Developed, well nourished, appropriate for stated age.  ?Neuro:  Alert and oriented,  extra-ocular muscles intact   ?HEENT:  Normocephalic, atraumatic, neck supple  ?Skin:  no gross rash, warm, pink. ?Cardiac:  RRR, S1 S2 ?Respiratory: CTA B/L  ?Vascular:  Ext warm, no cyanosis apprec.; cap RF less 2 sec. No edema. ?Psych:  No HI/SI, judgement and insight good, Euthymic mood. Full Affect. ? ? ?No results found for any visits on 08/05/21. ? Assessment & Plan  ?  ? ? ?Problem List Items Addressed This Visit   ? ?  ? Cardiovascular and Mediastinum  ? Essential hypertension - Primary (Chronic)  ?  -Controlled. ?-Continue current medication regimen. ?-Will collect CMP for medication monitoring. ?-Will continue to monitor. ? ?  ?  ? Relevant Medications  ? rosuvastatin (CRESTOR) 40 MG tablet  ? losartan (COZAAR) 50 MG tablet  ? Other Relevant Orders  ? Comp Met (CMET)  ?  ? Digestive  ? GERD (gastroesophageal reflux disease) (Chronic)  ?  -Stable. ?-Continue current medication regimen. ?-Will continue to monitor. ? ?  ?  ? Relevant Medications  ? esomeprazole (NEXIUM) 20 MG capsule  ?  ? Other  ? CLL (chronic lymphocytic leukemia) (HCC) (Chronic)  ?  -Last CBC, WBC 53.5. Stable. ?-Will repeat CBC and continue to monitor. ? ?  ?  ? Relevant Orders  ? CBC w/Diff  ? Hyperlipidemia, mixed (Chronic)  ?  -Last lipid panel: HDL 93, LDL 98 ?-Recommend to follow a low fat diet. ?-Continue current medication regimen. ?-Will continue to monitor.  ? ?  ?  ? Relevant Medications  ? rosuvastatin (CRESTOR) 40 MG tablet  ? losartan (COZAAR) 50 MG tablet  ? Other Relevant Orders  ? Comp Met (CMET)  ? ? ?Return in about 6 months (around 02/05/2022) for [place] and [place].  ?   ? ? ? ?Lorrene Reid, PA-C  ?[facility] at [facility] ?[phone] (phone) ?[phone] (fax) ? ? Medical Group ?

## 2021-08-05 NOTE — Patient Instructions (Signed)
Exercising to Stay Healthy To become healthy and stay healthy, it is recommended that you do moderate-intensity and vigorous-intensity exercise. You can tell that you are exercising at a moderate intensity if your heart starts beating faster and you start breathing faster but can still hold a conversation. You can tell that you are exercising at a vigorous intensity if you are breathing much harder and faster and cannot hold a conversation while exercising. How can exercise benefit me? Exercising regularly is important. It has many health benefits, such as: Improving overall fitness, flexibility, and endurance. Increasing bone density. Helping with weight control. Decreasing body fat. Increasing muscle strength and endurance. Reducing stress and tension, anxiety, depression, or anger. Improving overall health. What guidelines should I follow while exercising? Before you start a new exercise program, talk with your health care provider. Do not exercise so much that you hurt yourself, feel dizzy, or get very short of breath. Wear comfortable clothes and wear shoes with good support. Drink plenty of water while you exercise to prevent dehydration or heat stroke. Work out until your breathing and your heartbeat get faster (moderate intensity). How often should I exercise? Choose an activity that you enjoy, and set realistic goals. Your health care provider can help you make an activity plan that is individually designed and works best for you. Exercise regularly as told by your health care provider. This may include: Doing strength training two times a week, such as: Lifting weights. Using resistance bands. Push-ups. Sit-ups. Yoga. Doing a certain intensity of exercise for a given amount of time. Choose from these options: A total of 150 minutes of moderate-intensity exercise every week. A total of 75 minutes of vigorous-intensity exercise every week. A mix of moderate-intensity and  vigorous-intensity exercise every week. Children, pregnant women, people who have not exercised regularly, people who are overweight, and older adults may need to talk with a health care provider about what activities are safe to perform. If you have a medical condition, be sure to talk with your health care provider before you start a new exercise program. What are some exercise ideas? Moderate-intensity exercise ideas include: Walking 1 mile (1.6 km) in about 15 minutes. Biking. Hiking. Golfing. Dancing. Water aerobics. Vigorous-intensity exercise ideas include: Walking 4.5 miles (7.2 km) or more in about 1 hour. Jogging or running 5 miles (8 km) in about 1 hour. Biking 10 miles (16.1 km) or more in about 1 hour. Lap swimming. Roller-skating or in-line skating. Cross-country skiing. Vigorous competitive sports, such as football, basketball, and soccer. Jumping rope. Aerobic dancing. What are some everyday activities that can help me get exercise? Yard work, such as: Pushing a lawn mower. Raking and bagging leaves. Washing your car. Pushing a stroller. Shoveling snow. Gardening. Washing windows or floors. How can I be more active in my day-to-day activities? Use stairs instead of an elevator. Take a walk during your lunch break. If you drive, park your car farther away from your work or school. If you take public transportation, get off one stop early and walk the rest of the way. Stand up or walk around during all of your indoor phone calls. Get up, stretch, and walk around every 30 minutes throughout the day. Enjoy exercise with a friend. Support to continue exercising will help you keep a regular routine of activity. Where to find more information You can find more information about exercising to stay healthy from: U.S. Department of Health and Human Services: www.hhs.gov Centers for Disease Control and Prevention (  CDC): www.cdc.gov Summary Exercising regularly is  important. It will improve your overall fitness, flexibility, and endurance. Regular exercise will also improve your overall health. It can help you control your weight, reduce stress, and improve your bone density. Do not exercise so much that you hurt yourself, feel dizzy, or get very short of breath. Before you start a new exercise program, talk with your health care provider. This information is not intended to replace advice given to you by your health care provider. Make sure you discuss any questions you have with your health care provider. Document Revised: 07/06/2020 Document Reviewed: 07/06/2020 Elsevier Patient Education  2023 [employer]  

## 2021-08-06 ENCOUNTER — Other Ambulatory Visit (HOSPITAL_COMMUNITY): Payer: Self-pay

## 2021-08-06 LAB — COMPREHENSIVE METABOLIC PANEL
ALT: 33 IU/L (ref 0–44)
AST: 49 IU/L — ABNORMAL HIGH (ref 0–40)
Albumin/Globulin Ratio: 2.3 — ABNORMAL HIGH (ref 1.2–2.2)
Albumin: 4.6 g/dL (ref 3.8–4.8)
Alkaline Phosphatase: 80 IU/L (ref 44–121)
BUN/Creatinine Ratio: 7 — ABNORMAL LOW (ref 10–24)
BUN: 6 mg/dL — ABNORMAL LOW (ref 8–27)
Bilirubin Total: 0.3 mg/dL (ref 0.0–1.2)
CO2: 19 mmol/L — ABNORMAL LOW (ref 20–29)
Calcium: 8.9 mg/dL (ref 8.6–10.2)
Chloride: 98 mmol/L (ref 96–106)
Creatinine, Ser: 0.83 mg/dL (ref 0.76–1.27)
Globulin, Total: 2 g/dL (ref 1.5–4.5)
Glucose: 81 mg/dL (ref 70–99)
Potassium: 3.9 mmol/L (ref 3.5–5.2)
Sodium: 137 mmol/L (ref 134–144)
Total Protein: 6.6 g/dL (ref 6.0–8.5)
eGFR: 94 mL/min/{1.73_m2} (ref >59)

## 2021-08-06 LAB — CBC WITH DIFFERENTIAL/PLATELET
Basophils Absolute: 0.2 10*3/uL (ref 0.0–0.2)
Basos: 0 %
EOS (ABSOLUTE): 0.1 10*3/uL (ref 0.0–0.4)
Eos: 0 %
Hematocrit: 40.8 % (ref 37.5–51.0)
Hemoglobin: 13.3 g/dL (ref 13.0–17.7)
Immature Grans (Abs): 0 10*3/uL (ref 0.0–0.1)
Immature Granulocytes: 0 %
Lymphocytes Absolute: 74.5 10*3/uL — ABNORMAL HIGH (ref 0.7–3.1)
Lymphs: 93 %
MCH: 31.2 pg (ref 26.6–33.0)
MCHC: 32.6 g/dL (ref 31.5–35.7)
MCV: 96 fL (ref 79–97)
Monocytes Absolute: 1.6 10*3/uL — ABNORMAL HIGH (ref 0.1–0.9)
Monocytes: 2 %
Neutrophils Absolute: 3.6 10*3/uL (ref 1.4–7.0)
Neutrophils: 5 %
Platelets: 256 10*3/uL (ref 150–450)
RBC: 4.26 x10E6/uL (ref 4.14–5.80)
RDW: 12.5 % (ref 11.6–15.4)
WBC: 80 10*3/uL (ref 3.4–10.8)

## 2021-08-12 ENCOUNTER — Other Ambulatory Visit (HOSPITAL_COMMUNITY): Payer: Self-pay

## 2021-08-20 ENCOUNTER — Other Ambulatory Visit: Payer: Self-pay | Admitting: Physician Assistant

## 2021-08-20 DIAGNOSIS — R7989 Other specified abnormal findings of blood chemistry: Secondary | ICD-10-CM

## 2021-08-20 DIAGNOSIS — R945 Abnormal results of liver function studies: Secondary | ICD-10-CM

## 2021-08-21 ENCOUNTER — Other Ambulatory Visit: Payer: PPO

## 2021-08-21 DIAGNOSIS — R7989 Other specified abnormal findings of blood chemistry: Secondary | ICD-10-CM

## 2021-08-21 DIAGNOSIS — R945 Abnormal results of liver function studies: Secondary | ICD-10-CM | POA: Diagnosis not present

## 2021-08-22 LAB — HIV ANTIBODY (ROUTINE TESTING W REFLEX): HIV Screen 4th Generation wRfx: NONREACTIVE

## 2021-08-22 LAB — CBC
Hematocrit: 42.3 % (ref 37.5–51.0)
Hemoglobin: 14.1 g/dL (ref 13.0–17.7)
MCH: 31.3 pg (ref 26.6–33.0)
MCHC: 33.3 g/dL (ref 31.5–35.7)
MCV: 94 fL (ref 79–97)
Platelets: 182 10*3/uL (ref 150–450)
RBC: 4.51 x10E6/uL (ref 4.14–5.80)
RDW: 12.4 % (ref 11.6–15.4)
WBC: 65.8 10*3/uL (ref 3.4–10.8)

## 2021-08-22 LAB — HAV, HBV PANEL
Hep B Core Total Ab: NEGATIVE
Hep B Surface Ab, Qual: NONREACTIVE
Hepatitis B Surface Ag: NEGATIVE
hep A Total Ab: NEGATIVE

## 2021-08-22 LAB — GAMMA GT: GGT: 74 IU/L — ABNORMAL HIGH (ref 0–65)

## 2021-08-28 ENCOUNTER — Other Ambulatory Visit (HOSPITAL_COMMUNITY): Payer: Self-pay

## 2021-08-28 DIAGNOSIS — D225 Melanocytic nevi of trunk: Secondary | ICD-10-CM | POA: Diagnosis not present

## 2021-08-28 DIAGNOSIS — Z85828 Personal history of other malignant neoplasm of skin: Secondary | ICD-10-CM | POA: Diagnosis not present

## 2021-08-28 DIAGNOSIS — L821 Other seborrheic keratosis: Secondary | ICD-10-CM | POA: Diagnosis not present

## 2021-08-28 DIAGNOSIS — L814 Other melanin hyperpigmentation: Secondary | ICD-10-CM | POA: Diagnosis not present

## 2021-08-28 DIAGNOSIS — L570 Actinic keratosis: Secondary | ICD-10-CM | POA: Diagnosis not present

## 2021-08-28 DIAGNOSIS — L578 Other skin changes due to chronic exposure to nonionizing radiation: Secondary | ICD-10-CM | POA: Diagnosis not present

## 2021-08-28 MED ORDER — FLUOROURACIL 5 % EX CREA
TOPICAL_CREAM | CUTANEOUS | 0 refills | Status: DC
  Filled 2021-08-28: qty 40, 30d supply, fill #0

## 2021-08-29 ENCOUNTER — Other Ambulatory Visit (HOSPITAL_COMMUNITY): Payer: Self-pay

## 2021-09-06 NOTE — Patient Instructions (Incomplete)
Sleep Apnea Sleep apnea is a condition in which breathing pauses or becomes shallow during sleep. People with sleep apnea usually snore loudly. They may have times when they gasp and stop breathing for 10 seconds or more during sleep. This may happen many times during the night. Sleep apnea disrupts your sleep and keeps your body from getting the rest that it needs. This condition can increase your risk of certain health problems, including: Heart attack. Stroke. Obesity. Type 2 diabetes. Heart failure. Irregular heartbeat. High blood pressure. The goal of treatment is to help you breathe normally again. What are the causes?  The most common cause of sleep apnea is a collapsed or blocked airway. There are three kinds of sleep apnea: Obstructive sleep apnea. This kind is caused by a blocked or collapsed airway. Central sleep apnea. This kind happens when the part of the brain that controls breathing does not send the correct signals to the muscles that control breathing. Mixed sleep apnea. This is a combination of obstructive and central sleep apnea. What increases the risk? You are more likely to develop this condition if you: Are overweight. Smoke. Have a smaller than normal airway. Are older. Are male. Drink alcohol. Take sedatives or tranquilizers. Have a family history of sleep apnea. Have a tongue or tonsils that are larger than normal. What are the signs or symptoms? Symptoms of this condition include: Trouble staying asleep. Loud snoring. Morning headaches. Waking up gasping. Dry mouth or sore throat in the morning. Daytime sleepiness and tiredness. If you have daytime fatigue because of sleep apnea, you may be more likely to have: Trouble concentrating. Forgetfulness. Irritability or mood swings. Personality changes. Feelings of depression. Sexual dysfunction. This may include loss of interest if you are male, or erectile dysfunction if you are male. How is this  diagnosed? This condition may be diagnosed with: A medical history. A physical exam. A series of tests that are done while you are sleeping (sleep study). These tests are usually done in a sleep lab, but they may also be done at home. How is this treated? Treatment for this condition aims to restore normal breathing and to ease symptoms during sleep. It may involve managing health issues that can affect breathing, such as high blood pressure or obesity. Treatment may include: Sleeping on your side. Using a decongestant if you have nasal congestion. Avoiding the use of depressants, including alcohol, sedatives, and narcotics. Losing weight if you are overweight. Making changes to your diet. Quitting smoking. Using a device to open your airway while you sleep, such as: An oral appliance. This is a custom-made mouthpiece that shifts your lower jaw forward. A continuous positive airway pressure (CPAP) device. This device blows air through a mask when you breathe out (exhale). A nasal expiratory positive airway pressure (EPAP) device. This device has valves that you put into each nostril. A bi-level positive airway pressure (BIPAP) device. This device blows air through a mask when you breathe in (inhale) and breathe out (exhale). Having surgery if other treatments do not work. During surgery, excess tissue is removed to create a wider airway. Follow these instructions at home: Lifestyle Make any lifestyle changes that your health care provider recommends. Eat a healthy, well-balanced diet. Take steps to lose weight if you are overweight. Avoid using depressants, including alcohol, sedatives, and narcotics. Do not use any products that contain nicotine or tobacco. These products include cigarettes, chewing tobacco, and vaping devices, such as e-cigarettes. If you need help quitting, ask   your health care provider. General instructions Take over-the-counter and prescription medicines only as told  by your health care provider. If you were given a device to open your airway while you sleep, use it only as told by your health care provider. If you are having surgery, make sure to tell your health care provider you have sleep apnea. You may need to bring your device with you. Keep all follow-up visits. This is important. Contact a health care provider if: The device that you received to open your airway during sleep is uncomfortable or does not seem to be working. Your symptoms do not improve. Your symptoms get worse. Get help right away if: You develop: Chest pain. Shortness of breath. Discomfort in your back, arms, or stomach. You have: Trouble speaking. Weakness on one side of your body. Drooping in your face. These symptoms may represent a serious problem that is an emergency. Do not wait to see if the symptoms will go away. Get medical help right away. Call your local emergency services (911 in the [place] Do not drive yourself to the hospital. Summary Sleep apnea is a condition in which breathing pauses or becomes shallow during sleep. The most common cause is a collapsed or blocked airway. The goal of treatment is to restore normal breathing and to ease symptoms during sleep. This information is not intended to replace advice given to you by your health care provider. Make sure you discuss any questions you have with your health care provider. Document Revised: 10/17/2020 Document Reviewed: 02/17/2020 Elsevier Patient Education  2023 [employer]  

## 2021-09-10 ENCOUNTER — Ambulatory Visit (INDEPENDENT_AMBULATORY_CARE_PROVIDER_SITE_OTHER): Payer: PPO | Admitting: Physician Assistant

## 2021-09-10 ENCOUNTER — Other Ambulatory Visit (HOSPITAL_COMMUNITY): Payer: Self-pay

## 2021-09-10 ENCOUNTER — Encounter: Payer: Self-pay | Admitting: Physician Assistant

## 2021-09-10 VITALS — BP 118/81 | HR 85 | Ht 68.0 in | Wt 166.0 lb

## 2021-09-10 DIAGNOSIS — R0681 Apnea, not elsewhere classified: Secondary | ICD-10-CM

## 2021-09-10 DIAGNOSIS — R748 Abnormal levels of other serum enzymes: Secondary | ICD-10-CM | POA: Diagnosis not present

## 2021-09-10 DIAGNOSIS — R0683 Snoring: Secondary | ICD-10-CM | POA: Diagnosis not present

## 2021-09-10 NOTE — Progress Notes (Signed)
Established patient visit   Patient: George [name]   DOB: Sep 09, 1950   71 y.o. Male  MRN: [id number] Visit Date: 09/10/2021  Chief Complaint  Patient presents with   Follow-up    Patient presents today having trouble sleeping. Patient's wife feels that patient is stopping breathing while sleeping. He feels that he wakes up gasping for air. Patient would like a sleep apena test done prefers a at home if possible. Patient did try aleve PM and it did help.     Subjective    HPI HPI     Follow-up    Additional comments: Patient presents today having trouble sleeping. Patient's wife feels that patient is stopping breathing while sleeping. He feels that he wakes up gasping for air. Patient would like a sleep apena test done prefers a at home if possible. Patient did try aleve PM and it did help.        Last edited by Pennelope Bracken, CMA on 09/10/2021  1:14 PM.      Patient presents to discuss concerns of possible sleep apnea. Patient states was on vacation a few weeks ago and his wife noticed breathing was irregular. States woke up gasping for air and felt like he stopped breathing a few nights ago. Also reports feeling tired. States does snore.  In the past used to take Advil PM to help with sleep which he stopped a couple weeks ago. States has changed sleeping position as well to help with keeping his chin and head elevated. Does report family history of sleep apnea.    Medications: Outpatient Medications Prior to Visit  Medication Sig   cholecalciferol (VITAMIN D) 1000 UNITS tablet Take 1,000 Units by mouth daily. 2 tablets daily   Coenzyme Q10 (CO Q 10 PO) Take by mouth every morning.    esomeprazole (NEXIUM) 20 MG capsule TAKE 1 CAPSULE BY MOUTH ONCE A DAY   fluorouracil (EFUDEX) 5 % cream Apply to affected area once a day 1 hour before bedtime   fluticasone (FLONASE) 50 MCG/ACT nasal spray 1 spray each nostril following sinus rinses twice daily   GLUCOSAMINE HCL PO Take by mouth  every morning.    Green Tea, Camillia sinensis, (GREEN TEA PO) Take 315 mg by mouth every morning.    lactobacillus acidophilus (BACID) TABS tablet Take 1 tablet by mouth daily.    losartan (COZAAR) 50 MG tablet Take 1 tablet (50 mg total) by mouth daily.   Multiple Vitamin (MULTI-VITAMIN PO) Take by mouth every morning.    mupirocin ointment (BACTROBAN) 2 % Apply topically twice daily   NIACIN CR PO Take 500 mg by mouth daily.    Omega-3 Fatty Acids (FISH OIL CONCENTRATE PO) Take by mouth every morning.    psyllium (METAMUCIL) 58.6 % powder Take 1 packet by mouth 2 (two) times daily.    rosuvastatin (CRESTOR) 40 MG tablet TAKE 1 TABLET BY MOUTH ONCE A DAY   Zinc 30 MG TABS Take by mouth every morning.   No facility-administered medications prior to visit.    Review of Systems Review of Systems:  A fourteen system review of systems was performed and found to be positive as per HPI.     Objective    BP 118/81   Pulse 85   Ht '5\' 8"'$  (1.727 m)   Wt 166 lb (75.3 kg)   SpO2 95%   BMI 25.24 kg/m  BP Readings from Last 3 Encounters:  09/10/21 118/81  08/05/21 121/77  02/06/21 110/82  Wt Readings from Last 3 Encounters:  09/10/21 166 lb (75.3 kg)  08/05/21 165 lb (74.8 kg)  02/06/21 166 lb (75.3 kg)    Physical Exam  General:  Well Developed, well nourished, appropriate for stated age.  Neuro:  Alert and oriented,  extra-ocular muscles intact  HEENT:  Normocephalic, atraumatic, neck supple  Skin:  no gross rash, warm, pink. Cardiac:  RRR, S1 S2 Respiratory: CTA B/L  Vascular:  Ext warm, no cyanosis apprec.; cap RF less 2 sec. Psych:  No HI/SI, judgement and insight good, Euthymic mood. Full Affect.   No results found for any visits on 09/10/21.  Assessment & Plan      Problem List Items Addressed This Visit   None Visit Diagnoses     Snoring    -  Primary   Relevant Orders   Home sleep test   Episode of apnea       Relevant Orders   Home sleep test   Elevated  liver enzymes       Relevant Orders   US Abdomen Limited RUQ (LIVER/GB)      Patient has risk factors for sleep apnea. Patient completed STOP BANG questionnaire answering yes to 6 questions indicating high risk for OSA. Patient prefers a home sleep study, will place order. Epworth sleepiness scale is normal.    Return if symptoms worsen or fail to improve.        Lorrene Reid, PA-C  [facility] [phone] (phone) [phone] (fax)  [place]

## 2021-09-17 ENCOUNTER — Telehealth: Payer: Self-pay | Admitting: Physician Assistant

## 2021-09-17 ENCOUNTER — Other Ambulatory Visit: Payer: Self-pay | Admitting: Physician Assistant

## 2021-09-17 DIAGNOSIS — R0683 Snoring: Secondary | ICD-10-CM

## 2021-09-17 DIAGNOSIS — R0681 Apnea, not elsewhere classified: Secondary | ICD-10-CM

## 2021-09-19 ENCOUNTER — Encounter: Payer: Self-pay | Admitting: Hematology & Oncology

## 2021-09-19 ENCOUNTER — Other Ambulatory Visit: Payer: Self-pay

## 2021-09-19 ENCOUNTER — Inpatient Hospital Stay: Payer: PPO | Admitting: Hematology & Oncology

## 2021-09-19 ENCOUNTER — Telehealth: Payer: Self-pay | Admitting: *Deleted

## 2021-09-19 ENCOUNTER — Inpatient Hospital Stay: Payer: PPO | Attending: Hematology & Oncology

## 2021-09-19 VITALS — BP 145/94 | HR 88 | Temp 98.3°F | Resp 18 | Ht 68.0 in | Wt 162.0 lb

## 2021-09-19 DIAGNOSIS — C9110 Chronic lymphocytic leukemia of B-cell type not having achieved remission: Secondary | ICD-10-CM | POA: Insufficient documentation

## 2021-09-19 LAB — LACTATE DEHYDROGENASE: LDH: 97 U/L — ABNORMAL LOW (ref 98–192)

## 2021-09-19 LAB — CMP (CANCER CENTER ONLY)
ALT: 22 U/L (ref 0–44)
AST: 39 U/L (ref 15–41)
Albumin: 4.5 g/dL (ref 3.5–5.0)
Alkaline Phosphatase: 77 U/L (ref 38–126)
Anion gap: 14 (ref 5–15)
BUN: 6 mg/dL — ABNORMAL LOW (ref 8–23)
CO2: 25 mmol/L (ref 22–32)
Calcium: 9.3 mg/dL (ref 8.9–10.3)
Chloride: 95 mmol/L — ABNORMAL LOW (ref 98–111)
Creatinine: 0.73 mg/dL (ref 0.61–1.24)
GFR, Estimated: >60 mL/min (ref >60)
Glucose, Bld: 124 mg/dL — ABNORMAL HIGH (ref 70–99)
Potassium: 3.9 mmol/L (ref 3.5–5.1)
Sodium: 134 mmol/L — ABNORMAL LOW (ref 135–145)
Total Bilirubin: 0.7 mg/dL (ref 0.3–1.2)
Total Protein: 6.8 g/dL (ref 6.5–8.1)

## 2021-09-19 LAB — CBC WITH DIFFERENTIAL (CANCER CENTER ONLY)
Abs Immature Granulocytes: 0.09 10*3/uL — ABNORMAL HIGH (ref 0.00–0.07)
Basophils Absolute: 0.1 10*3/uL (ref 0.0–0.1)
Basophils Relative: 0 %
Eosinophils Absolute: 0 10*3/uL (ref 0.0–0.5)
Eosinophils Relative: 0 %
HCT: 39.2 % (ref 39.0–52.0)
Hemoglobin: 13 g/dL (ref 13.0–17.0)
Immature Granulocytes: 0 %
Lymphocytes Relative: 90 %
Lymphs Abs: 55.7 10*3/uL — ABNORMAL HIGH (ref 0.7–4.0)
MCH: 31.5 pg (ref 26.0–34.0)
MCHC: 33.2 g/dL (ref 30.0–36.0)
MCV: 94.9 fL (ref 80.0–100.0)
Monocytes Absolute: 1.1 10*3/uL — ABNORMAL HIGH (ref 0.1–1.0)
Monocytes Relative: 2 %
Neutro Abs: 5 10*3/uL (ref 1.7–7.7)
Neutrophils Relative %: 8 %
Platelet Count: 171 10*3/uL (ref 150–400)
RBC: 4.13 MIL/uL — ABNORMAL LOW (ref 4.22–5.81)
RDW: 12.9 % (ref 11.5–15.5)
Smear Review: NORMAL
WBC Count: 62 10*3/uL (ref 4.0–10.5)
nRBC: 0 % (ref 0.0–0.2)

## 2021-09-19 LAB — SAVE SMEAR(SSMR), FOR PROVIDER SLIDE REVIEW

## 2021-09-19 NOTE — Telephone Encounter (Signed)
Dr. Marin Olp notified of WBC-62.0.  No new orders received at this time.

## 2021-09-19 NOTE — Progress Notes (Signed)
Hematology and Oncology Follow Up Visit  George [name] [id number] [birth year] 71 y.o. 09/19/2021   Principle Diagnosis:  CLL-stage A  Current Therapy:   Observation     Interim History:  [name] is back for followup.  I see him yearly.  Since we last saw him, he has been doing okay.  He says he has sleep apnea.  He is going be tested for this.  He says he is felt a little fatigued.  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  He decided against radiation treatments.  He is applying some topical cream which I suspect is 5-FU based.  He and his wife are very active.  They are able to go places now.  There are old dog that he had last year passed on.  I know this has been very hard.  There has been no problems with cough or shortness of breath.  He has had no nausea or vomiting.  He has not noted any palpable lymph nodes.  He has had no change in bowel or bladder habits.  There is been no bleeding.  Currently, his performance status is ECOG 0.  Medications:  Current Outpatient Medications:    cholecalciferol (VITAMIN D) 1000 UNITS tablet, Take 1,000 Units by mouth daily. 2 tablets daily, Disp: , Rfl:    Coenzyme Q10 (CO Q 10 PO), Take by mouth every morning. , Disp: , Rfl:    esomeprazole (NEXIUM) 20 MG capsule, TAKE 1 CAPSULE BY MOUTH ONCE A DAY, Disp: 90 capsule, Rfl: 1   fluorouracil (EFUDEX) 5 % cream, Apply to affected area once a day 1 hour before bedtime, Disp: 40 g, Rfl: 0   fluticasone (FLONASE) 50 MCG/ACT nasal spray, 1 spray each nostril following sinus rinses twice daily, Disp: 16 g, Rfl: 2   GLUCOSAMINE HCL PO, Take by mouth every morning. , Disp: , Rfl:    Green Tea, Camillia sinensis, (GREEN TEA PO), Take 315 mg by mouth every morning. , Disp: , Rfl:    lactobacillus acidophilus (BACID) TABS tablet, Take 1 tablet by mouth daily. , Disp: , Rfl:    losartan (COZAAR) 50 MG tablet, Take 1 tablet (50 mg total) by mouth daily., Disp: 90  tablet, Rfl: 1   Multiple Vitamin (MULTI-VITAMIN PO), Take by mouth every morning. , Disp: , Rfl:    mupirocin ointment (BACTROBAN) 2 %, Apply topically twice daily, Disp: 22 g, Rfl: 0   NIACIN CR PO, Take 500 mg by mouth daily. , Disp: , Rfl:    Omega-3 Fatty Acids (FISH OIL CONCENTRATE PO), Take by mouth every morning. , Disp: , Rfl:    psyllium (METAMUCIL) 58.6 % powder, Take 1 packet by mouth 2 (two) times daily. , Disp: , Rfl:    rosuvastatin (CRESTOR) 40 MG tablet, TAKE 1 TABLET BY MOUTH ONCE A DAY, Disp: 90 tablet, Rfl: 1   Zinc 30 MG TABS, Take by mouth every morning., Disp: , Rfl:   Allergies:  Allergies  Allergen Reactions   Doxycycline Other (See Comments)    Sore throat, red tongue, red skin    Past Medical History, Surgical history, Social history, and Family History were reviewed and updated.  Review of Systems: Review of Systems  Constitutional: Negative.   HENT: Negative.    Eyes: Negative.   Respiratory: Negative.    Cardiovascular: Negative.   Gastrointestinal: Negative.   Genitourinary: Negative.   Musculoskeletal: Negative.   Skin: Negative.  Neurological: Negative.   Endo/Heme/Allergies: Negative.   Psychiatric/Behavioral: Negative.       Physical Exam:  height is '5\' 8"'$  (1.727 m) and weight is 162 lb (73.5 kg). His oral temperature is 98.3 F (36.8 C). His blood pressure is 145/94 (abnormal) and his pulse is 88. His respiration is 18 and oxygen saturation is 100%.   Physical Exam Vitals reviewed.  HENT:     Head: Normocephalic and atraumatic.  Eyes:     Pupils: Pupils are equal, round, and reactive to light.  Cardiovascular:     Rate and Rhythm: Normal rate and regular rhythm.     Heart sounds: Normal heart sounds.  Pulmonary:     Effort: Pulmonary effort is normal.     Breath sounds: Normal breath sounds.  Abdominal:     General: Bowel sounds are normal.     Palpations: Abdomen is soft.  Musculoskeletal:        General: No tenderness or  deformity. Normal range of motion.     Cervical back: Normal range of motion.  Lymphadenopathy:     Cervical: No cervical adenopathy.  Skin:    General: Skin is warm and dry.     Findings: No erythema or rash.  Neurological:     Mental Status: He is alert and oriented to person, place, and time.  Psychiatric:        Behavior: Behavior normal.        Thought Content: Thought content normal.        Judgment: Judgment normal.      Lab Results  Component Value Date   WBC 62.0 (HH) 09/19/2021   HGB 13.0 09/19/2021   HCT 39.2 09/19/2021   MCV 94.9 09/19/2021   PLT 171 09/19/2021     Chemistry      Component Value Date/Time   NA 137 08/05/2021 0859   K 3.9 08/05/2021 0859   CL 98 08/05/2021 0859   CO2 19 (L) 08/05/2021 0859   BUN 6 (L) 08/05/2021 0859   CREATININE 0.83 08/05/2021 0859   CREATININE 0.76 09/20/2020 0746   CREATININE 0.73 10/15/2015 0909      Component Value Date/Time   CALCIUM 8.9 08/05/2021 0859   ALKPHOS 80 08/05/2021 0859   AST 49 (H) 08/05/2021 0859   AST 37 09/20/2020 0746   ALT 33 08/05/2021 0859   ALT 24 09/20/2020 0746   BILITOT 0.3 08/05/2021 0859   BILITOT 1.4 (H) 09/20/2020 0746      Impression and Plan: George [name] is a 71 year old gentleman. He has CLL.  It does look like his disease might be more active now.  We have been seeing him for about 15 years.   So far, his white blood cell count is holding steady.  He is not anemic or thrombocytopenic.  He has had no problem with infections.  There is no swollen lymph nodes that are increased in size.  We will continue to watch him yearly.  At some point, I am sure we probably will have to treat him.  For right now, everything really looks good.  His quality of life is doing nicely.   Volanda Napoleon, MD 6/29/20238:48 AM

## 2021-09-19 NOTE — Telephone Encounter (Signed)
Per 09/19/21 los - gave upcoming appointments - confirmed

## 2021-09-20 LAB — IGG, IGA, IGM
IgA: 118 mg/dL (ref 61–437)
IgG (Immunoglobin G), Serum: 711 mg/dL (ref 603–1613)
IgM (Immunoglobulin M), Srm: 70 mg/dL (ref 20–172)

## 2021-09-23 ENCOUNTER — Ambulatory Visit
Admission: RE | Admit: 2021-09-23 | Discharge: 2021-09-23 | Disposition: A | Discharge status: Home / Self Care | Payer: PPO | Source: Ambulatory Visit | Attending: Physician Assistant | Admitting: Physician Assistant

## 2021-09-23 DIAGNOSIS — R748 Abnormal levels of other serum enzymes: Secondary | ICD-10-CM

## 2021-09-23 DIAGNOSIS — R945 Abnormal results of liver function studies: Secondary | ICD-10-CM | POA: Diagnosis not present

## 2021-09-24 LAB — PROTEIN ELECTROPHORESIS, SERUM, WITH REFLEX
A/G Ratio: 1.9 — ABNORMAL HIGH (ref 0.7–1.7)
Albumin ELP: 4.2 g/dL (ref 2.9–4.4)
Alpha-1-Globulin: 0.2 g/dL (ref 0.0–0.4)
Alpha-2-Globulin: 0.6 g/dL (ref 0.4–1.0)
Beta Globulin: 0.8 g/dL (ref 0.7–1.3)
Gamma Globulin: 0.6 g/dL (ref 0.4–1.8)
Globulin, Total: 2.2 g/dL (ref 2.2–3.9)
Total Protein ELP: 6.4 g/dL (ref 6.0–8.5)

## 2021-10-02 ENCOUNTER — Other Ambulatory Visit: Payer: Self-pay | Admitting: Physician Assistant

## 2021-10-02 DIAGNOSIS — R935 Abnormal findings on diagnostic imaging of other abdominal regions, including retroperitoneum: Secondary | ICD-10-CM

## 2021-10-02 DIAGNOSIS — K769 Liver disease, unspecified: Secondary | ICD-10-CM

## 2021-10-03 ENCOUNTER — Encounter: Payer: Self-pay | Admitting: Neurology

## 2021-10-03 ENCOUNTER — Telehealth: Payer: Self-pay | Admitting: Neurology

## 2021-10-03 ENCOUNTER — Ambulatory Visit: Payer: PPO | Admitting: Neurology

## 2021-10-03 VITALS — BP 112/77 | HR 70 | Ht 68.0 in | Wt 157.0 lb

## 2021-10-03 DIAGNOSIS — R0689 Other abnormalities of breathing: Secondary | ICD-10-CM | POA: Diagnosis not present

## 2021-10-03 DIAGNOSIS — R0681 Apnea, not elsewhere classified: Secondary | ICD-10-CM | POA: Diagnosis not present

## 2021-10-03 DIAGNOSIS — R519 Headache, unspecified: Secondary | ICD-10-CM | POA: Diagnosis not present

## 2021-10-03 DIAGNOSIS — R351 Nocturia: Secondary | ICD-10-CM | POA: Diagnosis not present

## 2021-10-03 DIAGNOSIS — R0683 Snoring: Secondary | ICD-10-CM

## 2021-10-03 NOTE — Patient Instructions (Signed)

## 2021-10-03 NOTE — Telephone Encounter (Signed)
[facility] pending faxed notes 

## 2021-10-03 NOTE — Progress Notes (Signed)
Subjective:    Patient ID: George [name] is a 71 y.o. male.  HPI    [name], MD, PhD [facility] [address], [address] [place], [place] [address]  Dear George [name],   I saw your patient, George [name], upon your kind request in my sleep clinic today for initial consultation of his sleep disorder, in particular, concern for underlying obstructive sleep apnea.  The patient is unaccompanied today.  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  He has had recurrent morning headaches.  I reviewed your office note from 09/10/2021.  His Epworth sleepiness score is 3 out of 24, fatigue severity score is 17 out of 63.  He reports that in the past few weeks, his sleep related symptoms have improved, his headaches have improved, he sleeps better at night, he wakes up better rested.  He has made some changes in his lifestyle and habits.  He has reduced his alcohol consumption.  Previously, he would drink up to 8 beers per day and is now limiting his himself to 1 a day.  He has kept a sleep schedule.  He goes to bed around 10:30 PM and rise time is around 5:30 AM.  He does have nocturia about twice per average night.  He does not have a TV in his bedroom.  He he sleeps in a separate bedroom from his wife, he has a small dog that sleeps with him.  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  He has a history of tremor which fluctuates, mom had a tremor and his son has a tremor as well.  He quit smoking in the 90s.  He does not drink caffeine daily.  His sister is currently undergoing evaluation for sleep apnea. He has increased his physical  activity, likes to ride his bicycle, likes daily currently.    His Past Medical History Is Significant For: Past Medical History:  Diagnosis Date   CLL (chronic lymphocytic leukemia) ([place]) 03/25/2007   Colon polyp    Elevated BP 04/23/2014   GERD (gastroesophageal reflux disease)    High cholesterol    History of chicken pox    childhood   Preventative health care 10/23/2013   Tremor of right hand 04/17/2014   and left hand    His Past Surgical History Is Significant For: Past Surgical History:  Procedure Laterality Date   COLONOSCOPY     ESOPHAGEAL DILATION  03/25/2003   [facility]   HERNIA REPAIR  1972   right groin   POLYPECTOMY      His Family History Is Significant For: Family History  Problem Relation Age of Onset   Hyperlipidemia Mother    Hypertension Mother        mother-father   Pulmonary fibrosis Mother    Diabetes Father    Heart disease Father        congestive heart failure   Other Father        brain tumor   Mental illness Sister        depression   GER disease Son    Stroke Maternal Grandmother    Diabetes Maternal Grandmother    Diabetes  Other        mother -father   Prostate cancer Neg Hx    Breast cancer Neg Hx    Colon cancer Neg Hx     His Social History Is Significant For: Social History   Socioeconomic History   Marital status: Married    Spouse name: Not on file   Number of children: 1   Years of education: Not on file   Highest education level: Not on file  Occupational History   Not on file  Tobacco Use   Smoking status: Former    Packs/day: 1.00    Years: 22.00    Total pack years: 22.00    Types: Cigarettes    Start date: 02/05/1970    Quit date: 03/07/1992    Years since quitting: 29.5   Smokeless tobacco: Never   Tobacco comments:    quit 21 years ago  Vaping Use   Vaping Use: Never used  Substance and Sexual Activity   Alcohol use: Yes    Alcohol/week: 7.0 standard drinks of alcohol    Types: 7 Cans of beer  per week    Comment: was 5 beers/day now 1 beer/day   Drug use: No   Sexual activity: Yes    Comment: lives with wife, no dietary restirctions  Other Topics Concern   Not on file  Social History Narrative   Lives at home with wife   Right handed   Caffeine: none   Social Determinants of Health   Financial Resource Strain: Not on file  Food Insecurity: Not on file  Transportation Needs: Not on file  Physical Activity: Not on file  Stress: Not on file  Social Connections: Not on file    His Allergies Are:  Allergies  Allergen Reactions   Doxycycline Other (See Comments)    Sore throat, red tongue, red skin  :   His Current Medications Are:  Outpatient Encounter Medications as of 10/03/2021  Medication Sig   cholecalciferol (VITAMIN D) 1000 UNITS tablet Take 1,000 Units by mouth daily. 2 tablets daily   Coenzyme Q10 (CO Q 10 PO) Take by mouth every morning.    esomeprazole (NEXIUM) 20 MG capsule TAKE 1 CAPSULE BY MOUTH ONCE A DAY   fluorouracil (EFUDEX) 5 % cream Apply to affected area once a day 1 hour before bedtime   fluticasone (FLONASE) 50 MCG/ACT nasal spray 1 spray each nostril following sinus rinses twice daily   GLUCOSAMINE HCL PO Take by mouth every morning.    losartan (COZAAR) 50 MG tablet Take 1 tablet (50 mg total) by mouth daily.   MAGNESIUM PO Take by mouth.   NIACIN CR PO Take 500 mg by mouth daily.    Omega-3 Fatty Acids (FISH OIL CONCENTRATE PO) Take by mouth every morning.    Probiotic Product (PROBIOTIC PO) Take by mouth.   rosuvastatin (CRESTOR) 40 MG tablet TAKE 1 TABLET BY MOUTH ONCE A DAY   Zinc 30 MG TABS Take by mouth every morning.   mupirocin ointment (BACTROBAN) 2 % Apply topically twice daily (Patient not taking: Reported on 10/03/2021)   psyllium (METAMUCIL) 58.6 % powder Take 1 packet by mouth 2 (two) times daily.  (Patient not taking: Reported on 10/03/2021)   [DISCONTINUED] Green Tea, Camillia sinensis, (GREEN TEA PO) Take 315 mg by mouth  every morning.  (Patient not taking: Reported on 10/03/2021)   [DISCONTINUED] lactobacillus acidophilus (BACID) TABS tablet Take 1 tablet by mouth daily.  (Patient not taking: Reported on  10/03/2021)   [DISCONTINUED] Multiple Vitamin (MULTI-VITAMIN PO) Take by mouth every morning.  (Patient not taking: Reported on 10/03/2021)   No facility-administered encounter medications on file as of 10/03/2021.  :   Review of Systems:  Out of a complete 14 point review of systems, all are reviewed and negative with the exception of these symptoms as listed below:   Review of Systems  Neurological:        Patient is here alone for a sleep consult for sleep issues. He was having issues with gasping for air at night, waking up multiple times, snoring. He wife would tell him that his breathing was irregular sometimes. He states he has actually been sleeping better lately for the last 3 weeks. He has started staying up later and he cut out alcohol. He no longer feels fatigued. 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 ESS 3 FSS 17.    Objective:  Neurological Exam  Physical Exam Physical Examination:   Vitals:   10/03/21 1009  BP: 112/77  Pulse: 70    General Examination: The patient is a very pleasant 71 y.o. male in no acute distress. He appears well-developed and well-nourished and well groomed.   HEENT: Normocephalic, atraumatic, pupils are equal, round and reactive to light, extraocular tracking is good without limitation to gaze excursion or nystagmus noted. Hearing is grossly intact. Face is symmetric with normal facial animation. Speech is clear with no dysarthria noted. There is no hypophonia. There is no lip, neck/head, jaw or voice tremor. Neck is supple with full range of passive and active motion. There are no carotid bruits on auscultation. Oropharynx exam reveals: mild mouth dryness, adequate dental hygiene with partial plate on top, mild to moderate airway crowding secondary to wider  uvula.  Tonsils 1-2+ on the left and 1+ on the right.  Mallampati class I.  Neck circumference of 16 inches.  Mild to moderate overbite.  Tongue protrudes centrally and palate elevates symmetrically.   Chest: Clear to auscultation without wheezing, rhonchi or crackles noted.  Heart: S1+S2+0, regular and normal without murmurs, rubs or gallops noted.   Abdomen: Soft, non-tender and non-distended.  Extremities: There is no pitting edema in the distal lower extremities bilaterally.   Skin: Warm and dry without trophic changes noted.   Musculoskeletal: exam reveals no obvious joint deformities, right ankle wider than left.   Neurologically:  Mental status: The patient is awake, alert and oriented in all 4 spheres. His immediate and remote memory, attention, language skills and fund of knowledge are appropriate. There is no evidence of aphasia, agnosia, apraxia or anomia. Speech is clear with normal prosody and enunciation. Thought process is linear. Mood is normal and affect is normal.  Cranial nerves II - XII are as described above under HEENT exam.  Motor exam: Normal bulk, strength and tone is noted. There is no resting tremor, he has a slight left more than right bilateral upper extremity postural and action tremor.  Fine motor skills and coordination: grossly intact.  Cerebellar testing: No dysmetria or intention tremor. There is no truncal or gait ataxia.  Sensory exam: intact to light touch in the upper and lower extremities.  Gait, station and balance: He stands easily. No veering to one side is noted. No leaning to one side is noted. Posture is age-appropriate and stance is narrow based. Gait shows normal stride length and normal pace. No problems turning are noted.   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 A laboratory attended sleep study is considered gold standard for evaluation of sleep disordered breathing and is recommended at this time and clinically justified.  His symptoms have improved with recent changes to his lifestyle and sleep habits, he is commended for making these positive changes.  He is advised that alcohol is notorious to disrupt sleep and cause fragmented and nonrestorative sleep. I had a long chat with the patient about my findings and the diagnosis of sleep apnea , particularly OSA, its prognosis and treatment options. We talked about medical/conservative treatments, surgical interventions and non-pharmacological approaches for symptom control. I explained, in particular, the risks and ramifications of untreated moderate to severe OSA, especially with respect to developing cardiovascular disease down the road, including congestive heart failure (CHF), difficult to treat hypertension, cardiac arrhythmias (particularly A-fib), neurovascular complications including TIA, stroke and dementia. Even type 2 diabetes has, in part, been linked to untreated OSA. Symptoms of untreated OSA may include (but may not be limited to) daytime sleepiness, nocturia (i.e. frequent nighttime urination), memory problems, mood irritability and suboptimally controlled or worsening mood disorder such as depression and/or anxiety, lack of energy, lack of motivation, physical discomfort, as well as recurrent headaches, especially morning or nocturnal headaches. We talked about the importance of maintaining a healthy lifestyle and striving for healthy  weight. In addition, we talked about the importance of striving for and maintaining good sleep hygiene. I recommended the following at this time: sleep study.  I outlined the differences between a laboratory attended sleep study which is considered more comprehensive and accurate over the option of a home sleep test (HST); the latter may lead to underestimation of sleep disordered breathing in some instances and does not help with diagnosing upper airway resistance syndrome and is not accurate enough to diagnose primary central sleep apnea typically. I explained the different sleep test procedures to the patient in detail and also outlined possible surgical and non-surgical treatment options of OSA, including the use of a pressure airway pressure (PAP) device (ie CPAP, AutoPAP/APAP or BiPAP in certain circumstances), a custom-made dental device (aka oral appliance, which would require a referral to a specialist dentist or orthodontist typically, and is generally speaking not considered a good choice for patients with full dentures or edentulous state), upper airway surgical options, such as traditional UPPP (which is not considered a first-line treatment) or the Inspire device (hypoglossal nerve stimulator, which would involve a referral for consultation with an ENT surgeon, after careful selection, following inclusion criteria). I explained the PAP treatment option to the patient in detail, as this is generally considered first-line treatment.  The patient indicated that he would be willing to try PAP therapy, if the need arises. I explained the importance of being compliant with PAP treatment, not only for insurance purposes but primarily to improve patient's symptoms symptoms, and for the patient's long term health benefit, including to reduce His cardiovascular risks longer-term.    We will pick up our discussion about the next steps and treatment options after testing.  We will keep him posted as to the  test results by phone call and/or MyChart messaging where possible.  We will ]plan to follow-up in sleep clinic accordingly as well.  I answered all his questions today and the patient was in agreement.   I encouraged him to call with any interim questions, concerns, problems or updates or email [name] through [place].  Generally speaking, sleep test authorizations may take up to 2 weeks, sometimes less, sometimes longer, the patient is encouraged to get in touch with [name] if they do not hear back from the sleep lab staff directly within the next 2 weeks.  Thank you very much for allowing me to participate in the care of this nice patient. If I can be of any further assistance to you please do not hesitate to call me at [phone].  Sincerely,   [name], MD, PhD

## 2021-10-08 ENCOUNTER — Encounter: Payer: Self-pay | Admitting: Gastroenterology

## 2021-10-17 ENCOUNTER — Telehealth: Payer: Self-pay

## 2021-10-17 NOTE — Telephone Encounter (Signed)
LVM for pt to call back to schedule sleep study.  

## 2021-10-28 NOTE — Telephone Encounter (Signed)
spoke to the patient right now he does not want to proceed   HTA auth: [id number] (exp. 10/03/21 to 12/04/21)

## 2021-10-28 NOTE — Telephone Encounter (Signed)
spoke to the patient right now he does not want to proceed   HTA auth: [id number] (exp. 10/03/21 to 12/04/21)

## 2021-10-29 ENCOUNTER — Institutional Professional Consult (permissible substitution): Payer: PPO | Admitting: Neurology

## 2021-11-02 ENCOUNTER — Other Ambulatory Visit (HOSPITAL_COMMUNITY): Payer: Self-pay

## 2021-11-08 ENCOUNTER — Ambulatory Visit: Payer: PPO | Admitting: Gastroenterology

## 2021-12-12 ENCOUNTER — Other Ambulatory Visit (HOSPITAL_COMMUNITY): Payer: Self-pay

## 2021-12-20 ENCOUNTER — Encounter: Payer: Self-pay | Admitting: Physician Assistant

## 2021-12-20 ENCOUNTER — Ambulatory Visit (INDEPENDENT_AMBULATORY_CARE_PROVIDER_SITE_OTHER): Payer: PPO | Admitting: Physician Assistant

## 2021-12-20 ENCOUNTER — Other Ambulatory Visit (HOSPITAL_COMMUNITY): Payer: Self-pay

## 2021-12-20 VITALS — BP 115/74 | HR 89 | Ht 68.0 in | Wt 160.1 lb

## 2021-12-20 DIAGNOSIS — H6993 Unspecified Eustachian tube disorder, bilateral: Secondary | ICD-10-CM

## 2021-12-20 DIAGNOSIS — H6983 Other specified disorders of Eustachian tube, bilateral: Secondary | ICD-10-CM | POA: Diagnosis not present

## 2021-12-20 NOTE — Patient Instructions (Signed)
Eustachian Tube Dysfunction  Eustachian tube dysfunction refers to a condition in which a blockage develops in the narrow passage that connects the middle ear to the back of the nose (eustachian tube). The eustachian tube regulates air pressure in the middle ear by letting air move between the ear and nose. It also helps to drain fluid from the middle ear space. Eustachian tube dysfunction can affect one or both ears. When the eustachian tube does not function properly, air pressure, fluid, or both can build up in the middle ear. What are the causes? This condition occurs when the eustachian tube becomes blocked or cannot open normally. Common causes of this condition include: Ear infections. Colds and other infections that affect the nose, mouth, and throat (upper respiratory tract). Allergies. Irritation from cigarette smoke. Irritation from stomach acid coming up into the esophagus (gastroesophageal reflux). The esophagus is the part of the body that moves food from the mouth to the stomach. Sudden changes in air pressure, such as from descending in an airplane or scuba diving. Abnormal growths in the nose or throat, such as: Growths that line the nose (nasal polyps). Abnormal growth of cells (tumors). Enlarged tissue at the back of the throat (adenoids). What increases the risk? You are more likely to develop this condition if: You smoke. You are overweight. You are a child who has: Certain birth defects of the mouth, such as cleft palate. Large tonsils or adenoids. What are the signs or symptoms? Common symptoms of this condition include: A feeling of fullness in the ear. Ear pain. Clicking or popping noises in the ear. Ringing in the ear (tinnitus). Hearing loss. Loss of balance. Dizziness. Symptoms may get worse when the air pressure around you changes, such as when you travel to an area of high elevation, fly on an airplane, or go scuba diving. How is this diagnosed? This  condition may be diagnosed based on: Your symptoms. A physical exam of your ears, nose, and throat. Tests, such as those that measure: The movement of your eardrum. Your hearing (audiometry). How is this treated? Treatment depends on the cause and severity of your condition. In mild cases, you may relieve your symptoms by moving air into your ears. This is called "popping the ears." In more severe cases, or if you have symptoms of fluid in your ears, treatment may include: Medicines to relieve congestion (decongestants). Medicines that treat allergies (antihistamines). Nasal sprays or ear drops that contain medicines that reduce swelling (steroids). A procedure to drain the fluid in your eardrum. In this procedure, a small tube may be placed in the eardrum to: Drain the fluid. Restore the air in the middle ear space. A procedure to insert a balloon device through the nose to inflate the opening of the eustachian tube (balloon dilation). Follow these instructions at home: Lifestyle Do not do any of the following until your health care provider approves: Travel to high altitudes. Fly in airplanes. Work in a pressurized cabin or room. Scuba dive. Do not use any products that contain nicotine or tobacco. These products include cigarettes, chewing tobacco, and vaping devices, such as e-cigarettes. If you need help quitting, ask your health care provider. Keep your ears dry. Wear fitted earplugs during showering and bathing. Dry your ears completely after. General instructions Take over-the-counter and prescription medicines only as told by your health care provider. Use techniques to help pop your ears as recommended by your health care provider. These may include: Chewing gum. Yawning. Frequent, forceful swallowing.   Closing your mouth, holding your nose closed, and gently blowing as if you are trying to blow air out of your nose. Keep all follow-up visits. This is important. Contact a  health care provider if: Your symptoms do not go away after treatment. Your symptoms come back after treatment. You are unable to pop your ears. You have: A fever. Pain in your ear. Pain in your head or neck. Fluid draining from your ear. Your hearing suddenly changes. You become very dizzy. You lose your balance. Get help right away if: You have a sudden, severe increase in any of your symptoms. Summary Eustachian tube dysfunction refers to a condition in which a blockage develops in the eustachian tube. It can be caused by ear infections, allergies, inhaled irritants, or abnormal growths in the nose or throat. Symptoms may include ear pain or fullness, hearing loss, or ringing in the ears. Mild cases are treated with techniques to unblock the ears, such as yawning or chewing gum. More severe cases are treated with medicines or procedures. This information is not intended to replace advice given to you by your health care provider. Make sure you discuss any questions you have with your health care provider. Document Revised: 05/21/2020 Document Reviewed: 05/21/2020 Elsevier Patient Education  2023 [employer]vier Inc.  

## 2021-12-20 NOTE — Progress Notes (Signed)
Established patient acute visit   Patient: George [name]   DOB: 06-24-1950   71 y.o. Male  MRN: [id number] Visit Date: 12/20/2021  Chief Complaint  Patient presents with   Ear Fullness   Subjective    HPI  Patient presents for c/o bilateral ear fullness x 3 weeks. Feels like they are stopped up. Also had some head congestion. Does have bilateral ear ringing (L>R) and seems to be more sensitive to noises. Worse in the mornings. Has been taking a decongestant which has provided some relief. Does have ENT appointment in November. No fever or otorrhea.     Medications: Outpatient Medications Prior to Visit  Medication Sig   cholecalciferol (VITAMIN D) 1000 UNITS tablet Take 1,000 Units by mouth daily. 2 tablets daily   Coenzyme Q10 (CO Q 10 PO) Take by mouth every morning.    esomeprazole (NEXIUM) 20 MG capsule TAKE 1 CAPSULE BY MOUTH ONCE A DAY   fluorouracil (EFUDEX) 5 % cream Apply to affected area once a day 1 hour before bedtime   fluticasone (FLONASE) 50 MCG/ACT nasal spray 1 spray each nostril following sinus rinses twice daily   GLUCOSAMINE HCL PO Take by mouth every morning.    losartan (COZAAR) 50 MG tablet Take 1 tablet (50 mg total) by mouth daily.   MAGNESIUM PO Take by mouth.   NIACIN CR PO Take 500 mg by mouth daily.    Omega-3 Fatty Acids (FISH OIL CONCENTRATE PO) Take by mouth every morning.    Probiotic Product (PROBIOTIC PO) Take by mouth.   rosuvastatin (CRESTOR) 40 MG tablet TAKE 1 TABLET BY MOUTH ONCE A DAY   Zinc 30 MG TABS Take by mouth every morning.   mupirocin ointment (BACTROBAN) 2 % Apply topically twice daily (Patient not taking: Reported on 10/03/2021)   psyllium (METAMUCIL) 58.6 % powder Take 1 packet by mouth 2 (two) times daily.  (Patient not taking: Reported on 10/03/2021)   No facility-administered medications prior to visit.    Review of Systems Review of Systems:  A fourteen system review of systems was performed and found to be positive as  per HPI.  Last CBC Lab Results  Component Value Date   WBC 62.0 (HH) 09/19/2021   HGB 13.0 09/19/2021   HCT 39.2 09/19/2021   MCV 94.9 09/19/2021   MCH 31.5 09/19/2021   RDW 12.9 09/19/2021   PLT 171 74/94/4967   Last metabolic panel Lab Results  Component Value Date   GLUCOSE 124 (H) 09/19/2021   NA 134 (L) 09/19/2021   K 3.9 09/19/2021   CL 95 (L) 09/19/2021   CO2 25 09/19/2021   BUN 6 (L) 09/19/2021   CREATININE 0.73 09/19/2021   GFRNONAA >60 09/19/2021   CALCIUM 9.3 09/19/2021   PHOS 2.7 10/03/2013   PROT 6.8 09/19/2021   ALBUMIN 4.5 09/19/2021   LABGLOB 2.2 09/19/2021   AGRATIO 1.9 (H) 09/19/2021   BILITOT 0.7 09/19/2021   ALKPHOS 77 09/19/2021   AST 39 09/19/2021   ALT 22 09/19/2021   ANIONGAP 14 09/19/2021   Last lipids Lab Results  Component Value Date   CHOL 200 (H) 02/01/2021   HDL 93 02/01/2021   LDLCALC 98 02/01/2021   TRIG 50 02/01/2021   CHOLHDL 2.2 02/01/2021   Last hemoglobin A1c Lab Results  Component Value Date   HGBA1C 5.4 02/01/2021       Objective    BP 115/74   Pulse 89   Ht '5\' 8"'$  (1.727 m)  Wt 160 lb 1.9 oz (72.6 kg)   SpO2 97%   BMI 24.35 kg/m    Physical Exam  General:  Well Developed, well nourished, appropriate for stated age.  Neuro:  Alert and oriented,  extra-ocular muscles intact  HEENT:  Normocephalic, atraumatic, PERRL, no sinus tenderness, some fluid behind both TM's (L>R) noted, no redness or swelling, pink and moist nasal mucosa, no adenopathy  Skin:  no gross rash, warm, pink. Respiratory: Speaking in full sentences, unlabored. Vascular:  Ext warm, no cyanosis apprec.; cap RF less 2 sec. Psych:  No HI/SI, judgement and insight good, Euthymic mood. Full Affect.   No results found for any visits on 12/20/21.  Assessment & Plan     Discussed with patient has s/sx suggestive of ETD. Recommend to keep ENT appointment as scheduled. Advised patient to increase Flonase to 1 spray in each nostril twice daily and  start an oral antihistamine such as Claritin or Allegra. Continue with a decongestant. Advised if symptoms fail to improve or worsen then we can consider trial of short course of otic corticosteroid. Pt verbalized understanding.   Return if symptoms worsen or fail to improve.        Lorrene Reid, PA-C  [facility] [phone] (phone) [phone] (fax)  [place]

## 2021-12-30 ENCOUNTER — Other Ambulatory Visit (HOSPITAL_COMMUNITY): Payer: Self-pay

## 2021-12-31 ENCOUNTER — Other Ambulatory Visit (HOSPITAL_COMMUNITY): Payer: Self-pay

## 2021-12-31 DIAGNOSIS — L570 Actinic keratosis: Secondary | ICD-10-CM | POA: Diagnosis not present

## 2021-12-31 MED ORDER — FLUOROURACIL 5 % EX CREA
TOPICAL_CREAM | CUTANEOUS | 0 refills | Status: DC
  Filled 2021-12-31: qty 40, 30d supply, fill #0

## 2022-01-27 DIAGNOSIS — H903 Sensorineural hearing loss, bilateral: Secondary | ICD-10-CM | POA: Diagnosis not present

## 2022-01-30 ENCOUNTER — Other Ambulatory Visit (HOSPITAL_COMMUNITY): Payer: Self-pay

## 2022-02-07 ENCOUNTER — Encounter: Payer: Self-pay | Admitting: Physician Assistant

## 2022-02-07 ENCOUNTER — Ambulatory Visit (INDEPENDENT_AMBULATORY_CARE_PROVIDER_SITE_OTHER): Payer: PPO | Admitting: Physician Assistant

## 2022-02-07 VITALS — BP 132/85 | HR 82 | Resp 18 | Ht 68.0 in | Wt 162.0 lb

## 2022-02-07 DIAGNOSIS — I10 Essential (primary) hypertension: Secondary | ICD-10-CM

## 2022-02-07 DIAGNOSIS — C9110 Chronic lymphocytic leukemia of B-cell type not having achieved remission: Secondary | ICD-10-CM | POA: Diagnosis not present

## 2022-02-07 DIAGNOSIS — E782 Mixed hyperlipidemia: Secondary | ICD-10-CM

## 2022-02-07 DIAGNOSIS — E559 Vitamin D deficiency, unspecified: Secondary | ICD-10-CM

## 2022-02-07 DIAGNOSIS — Z0000 Encounter for general adult medical examination without abnormal findings: Secondary | ICD-10-CM

## 2022-02-07 LAB — LIPID PANEL
Cholesterol: 170 (ref 0–200)
HDL: 59 (ref 35–70)
LDL Cholesterol: 85

## 2022-02-07 NOTE — Patient Instructions (Signed)
Preventive Care 65 Years and Older, Male Preventive care refers to lifestyle choices and visits with your health care provider that can promote health and wellness. Preventive care visits are also called wellness exams. What can I expect for my preventive care visit? Counseling During your preventive care visit, your health care provider may ask about your: Medical history, including: Past medical problems. Family medical history. History of falls. Current health, including: Emotional well-being. Home life and relationship well-being. Sexual activity. Memory and ability to understand (cognition). Lifestyle, including: Alcohol, nicotine or tobacco, and drug use. Access to firearms. Diet, exercise, and sleep habits. Work and work environment. Sunscreen use. Safety issues such as seatbelt and bike helmet use. Physical exam Your health care provider will check your: Height and weight. These may be used to calculate your BMI (body mass index). BMI is a measurement that tells if you are at a healthy weight. Waist circumference. This measures the distance around your waistline. This measurement also tells if you are at a healthy weight and may help predict your risk of certain diseases, such as type 2 diabetes and high blood pressure. Heart rate and blood pressure. Body temperature. Skin for abnormal spots. What immunizations do I need?  Vaccines are usually given at various ages, according to a schedule. Your health care provider will recommend vaccines for you based on your age, medical history, and lifestyle or other factors, such as travel or where you work. What tests do I need? Screening Your health care provider may recommend screening tests for certain conditions. This may include: Lipid and cholesterol levels. Diabetes screening. This is done by checking your blood sugar (glucose) after you have not eaten for a while (fasting). Hepatitis C test. Hepatitis B test. HIV (human  immunodeficiency virus) test. STI (sexually transmitted infection) testing, if you are at risk. Lung cancer screening. Colorectal cancer screening. Prostate cancer screening. Abdominal aortic aneurysm (AAA) screening. You may need this if you are a current or former smoker. Talk with your health care provider about your test results, treatment options, and if necessary, the need for more tests. Follow these instructions at home: Eating and drinking  Eat a diet that includes fresh fruits and vegetables, whole grains, lean protein, and low-fat dairy products. Limit your intake of foods with high amounts of sugar, saturated fats, and salt. Take vitamin and mineral supplements as recommended by your health care provider. Do not drink alcohol if your health care provider tells you not to drink. If you drink alcohol: Limit how much you have to 0-2 drinks a day. Know how much alcohol is in your drink. In the [place] one drink equals one 12 oz bottle of beer (355 mL), one 5 oz glass of wine (148 mL), or one 1 oz glass of hard liquor (44 mL). Lifestyle Brush your teeth every morning and night with fluoride toothpaste. Floss one time each day. Exercise for at least 30 minutes 5 or more days each week. Do not use any products that contain nicotine or tobacco. These products include cigarettes, chewing tobacco, and vaping devices, such as e-cigarettes. If you need help quitting, ask your health care provider. Do not use drugs. If you are sexually active, practice safe sex. Use a condom or other form of protection to prevent STIs. Take aspirin only as told by your health care provider. Make sure that you understand how much to take and what form to take. Work with your health care provider to find out whether it is safe   and beneficial for you to take aspirin daily. Ask your health care provider if you need to take a cholesterol-lowering medicine (statin). Find healthy ways to manage stress, such  as: Meditation, yoga, or listening to music. Journaling. Talking to a trusted person. Spending time with friends and family. Safety Always wear your seat belt while driving or riding in a vehicle. Do not drive: If you have been drinking alcohol. Do not ride with someone who has been drinking. When you are tired or distracted. While texting. If you have been using any mind-altering substances or drugs. Wear a helmet and other protective equipment during sports activities. If you have firearms in your house, make sure you follow all gun safety procedures. Minimize exposure to UV radiation to reduce your risk of skin cancer. What's next? Visit your health care provider once a year for an annual wellness visit. Ask your health care provider how often you should have your eyes and teeth checked. Stay up to date on all vaccines. This information is not intended to replace advice given to you by your health care provider. Make sure you discuss any questions you have with your health care provider. Document Revised: 09/05/2020 Document Reviewed: 09/05/2020 Elsevier Patient Education  2023 Elsevier Inc.  

## 2022-02-07 NOTE — Progress Notes (Signed)
Subjective:   George [name] is a 71 y.o. male who presents for Medicare Annual/Subsequent preventive examination.  Review of Systems    General:   No F/C, wt loss Pulm:   No DIB, SOB, pleuritic chest pain Card:  No CP, palpitations Abd:  No n/v/d or pain Ext:  No edema        Objective:    Today's Vitals   02/07/22 0857  BP: 132/85  Pulse: 82  Resp: 18  SpO2: 97%  Weight: 162 lb (73.5 kg)  Height: '5\' 8"'$  (1.727 m)   Body mass index is 24.63 kg/m.     09/19/2021    8:23 AM 12/19/2020    1:47 PM 09/21/2019    8:21 AM 09/17/2018    8:20 AM 03/03/2018    8:01 AM 03/04/2017    8:17 AM 09/02/2016    8:10 AM  Advanced Directives  Does Patient Have a Medical Advance Directive? Yes Yes Yes Yes Yes Yes No  Type of Advance Directive Living will;Healthcare Power of [place];Living will [place];Living will [place];Living will [place];Living will [place];Living will   Does patient want to make changes to medical advance directive? No - Patient declined No - Patient declined No - Patient declined   No - Patient declined   Copy of [place] in Chart? No - copy requested No - copy requested No - copy requested   No - copy requested     Current Medications (verified) Outpatient Encounter Medications as of 02/07/2022  Medication Sig   cholecalciferol (VITAMIN D) 1000 UNITS tablet Take 1,000 Units by mouth daily. 2 tablets daily   Coenzyme Q10 (CO Q 10 PO) Take by mouth every morning.    esomeprazole (NEXIUM) 20 MG capsule TAKE 1 CAPSULE BY MOUTH ONCE A DAY   fluorouracil (EFUDEX) 5 % cream Apply to affected area once a day 1 hour before bedtime   fluorouracil (EFUDEX) 5 % cream Apply to the affected area(s) once a day for 2 weeks as directed   fluticasone (FLONASE) 50 MCG/ACT nasal spray 1 spray each nostril following sinus rinses twice daily    GLUCOSAMINE HCL PO Take by mouth every morning.    losartan (COZAAR) 50 MG tablet Take 1 tablet (50 mg total) by mouth daily.   MAGNESIUM PO Take by mouth.   mupirocin ointment (BACTROBAN) 2 % Apply topically twice daily   NIACIN CR PO Take 500 mg by mouth daily.    Omega-3 Fatty Acids (FISH OIL CONCENTRATE PO) Take by mouth every morning.    Probiotic Product (PROBIOTIC PO) Take by mouth.   psyllium (METAMUCIL) 58.6 % powder Take 1 packet by mouth 2 (two) times daily.   rosuvastatin (CRESTOR) 40 MG tablet TAKE 1 TABLET BY MOUTH ONCE A DAY   Zinc 30 MG TABS Take by mouth every morning.   No facility-administered encounter medications on file as of 02/07/2022.    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   Colon polyp    Elevated BP 04/23/2014   GERD (gastroesophageal reflux disease)    High cholesterol    History of chicken pox    childhood   Preventative health care 10/23/2013   Tremor of right hand 04/17/2014   and left hand   Past Surgical History:  Procedure Laterality Date   COLONOSCOPY  ESOPHAGEAL DILATION  03/25/2003   [facility]   HERNIA REPAIR  1972   right groin   POLYPECTOMY     Family History  Problem Relation Age of Onset   Hyperlipidemia Mother    Hypertension Mother        mother-father   Pulmonary fibrosis Mother    Diabetes Father    Heart disease Father        congestive heart failure   Other Father        brain tumor   Mental illness Sister        depression   GER disease Son    Stroke Maternal Grandmother    Diabetes Maternal Grandmother    Diabetes Other        mother -father   Prostate cancer Neg Hx    Breast cancer Neg Hx    Colon cancer Neg Hx    Social History   Socioeconomic History   Marital status: Married    Spouse name: Not on file   Number of children: 1   Years of education: Not on file   Highest education level: Not on  file  Occupational History   Not on file  Tobacco Use   Smoking status: Former    Packs/day: 1.00    Years: 22.00    Total pack years: 22.00    Types: Cigarettes    Start date: 02/05/1970    Quit date: 03/07/1992    Years since quitting: 29.9   Smokeless tobacco: Never   Tobacco comments:    quit 21 years ago  Vaping Use   Vaping Use: Never used  Substance and Sexual Activity   Alcohol use: Yes    Alcohol/week: 7.0 standard drinks of alcohol    Types: 7 Cans of beer per week    Comment: was 5 beers/day now 1 beer/day   Drug use: No   Sexual activity: Yes    Comment: lives with wife, no dietary restirctions  Other Topics Concern   Not on file  Social History Narrative   Lives at home with wife   Right handed   Caffeine: none   Social Determinants of Health   Financial Resource Strain: Not on file  Food Insecurity: Not on file  Transportation Needs: Not on file  Physical Activity: Not on file  Stress: Not on file  Social Connections: Not on file    Tobacco Counseling Counseling given: Not Answered Tobacco comments: quit 21 years ago    Diabetic?no         Activities of Daily Living    02/07/2022    8:57 AM 12/20/2021    9:14 AM  In your present state of health, do you have any difficulty performing the following activities:  Hearing? 1 1  Vision? 0 0  Difficulty concentrating or making decisions? 0 0  Walking or climbing stairs? 0 0  Dressing or bathing? 0 0  Doing errands, shopping? 0 0    Patient Care Team: Lorrene Reid, PA-C as PCP - General [place], Jennefer Bravo, MD as Referring Physician (Dermatology) Volanda Napoleon, MD as Consulting Physician (Oncology) Mansouraty, Telford Nab., MD as Consulting Physician (Gastroenterology) Eppie Gibson, MD as Consulting Physician (Radiation Oncology) Malmfelt, Stephani Police, RN as Oncology Nurse Navigator Harl Bowie, MD as Referring Physician (Dermatology)  Indicate any recent Medical Services  you may have received from other than [facility] providers in the past year (date may be approximate).     Assessment:   This is  a routine wellness examination for [name].  Hearing/Vision screen No results found.  Dietary issues and exercise activities discussed:  -Stays active with house and outdoor activities. Recommend to follow a low fat and carbohydrate diet.   Goals Addressed   None   Depression Screen    02/07/2022    8:56 AM 12/20/2021    9:14 AM 09/10/2021    1:15 PM 08/05/2021    8:39 AM 02/06/2021   10:34 AM 10/09/2020    8:25 AM 03/07/2020    9:23 AM  PHQ 2/9 Scores  PHQ - 2 Score 0 0 0 0 0 0 0  PHQ- 9 Score 0 0 0 0 0 0 0    Fall Risk    02/07/2022    8:57 AM 08/05/2021    8:39 AM 02/06/2021   10:34 AM 10/09/2020    8:24 AM 03/07/2020    9:23 AM  Fall Risk   Falls in the past year? 0 0 0 0 0  Number falls in past yr: 0 0 0 0   Injury with Fall? 0 0 0 0   Risk for fall due to :  No Fall Risks No Fall Risks No Fall Risks   Follow up  Falls evaluation completed Falls evaluation completed Falls evaluation completed Falls evaluation completed    [place]:  Any stairs in or around the home?  declined If so, are there any without handrails?  declined Home free of loose throw rugs in walkways, pet beds, electrical cords, etc?  declined Adequate lighting in your home to reduce risk of falls?  declined  ASSISTIVE DEVICES UTILIZED TO PREVENT FALLS:  Life alert?  declined Use of a cane, walker or w/c?  declined Grab bars in the bathroom?  declined Shower chair or bench in shower?  declined Elevated toilet seat or a handicapped toilet?  declined  TIMED UP AND GO:  Was the test performed?  declined .  Length of time to ambulate 10 feet: 0 sec.     Cognitive Function:        02/07/2022    8:50 AM 02/06/2021   10:13 AM 06/15/2019    8:07 AM  6CIT Screen  What Year? 0 points 0 points 0 points  What month? 0 points 0 points 0  points  What time?  0 points 0 points  Count back from 20  0 points 0 points  Months in reverse  0 points 0 points  Repeat phrase  0 points 0 points  Total Score  0 points 0 points    Immunizations Immunization History  Administered Date(s) Administered   Fluad Quad(high Dose 65+) 01/14/2019   Influenza Whole 01/22/2013   Influenza, High Dose Seasonal PF 01/09/2017, 01/06/2018   Influenza,inj,Quad PF,6+ Mos 12/26/2015   Influenza-Unspecified 12/09/2010, 12/27/2013, 12/29/2014   Pneumococcal Conjugate-13 03/08/2015   Pneumococcal-Unspecified 02/12/2010   Tdap 02/13/2011   Zoster Recombinat (Shingrix) 03/15/2018, 05/20/2018    TDAP status: Due, Education has been provided regarding the importance of this vaccine. Advised may receive this vaccine at local pharmacy or Health Dept. Aware to provide a copy of the vaccination record if obtained from local pharmacy or Health Dept. Verbalized acceptance and understanding.  Flu Vaccine status: Declined, Education has been provided regarding the importance of this vaccine but patient still declined. Advised may receive this vaccine at local pharmacy or Health Dept. Aware to provide a copy of the vaccination record if obtained from local pharmacy or Health  Dept. Verbalized acceptance and understanding.  Pneumococcal vaccine status: Due, Education has been provided regarding the importance of this vaccine. Advised may receive this vaccine at local pharmacy or Health Dept. Aware to provide a copy of the vaccination record if obtained from local pharmacy or Health Dept. Verbalized acceptance and understanding.  Covid-19 vaccine status: Declined, Education has been provided regarding the importance of this vaccine but patient still declined. Advised may receive this vaccine at local pharmacy or Health Dept.or vaccine clinic. Aware to provide a copy of the vaccination record if obtained from local pharmacy or Health Dept. Verbalized acceptance and  understanding.  Qualifies for Shingles Vaccine? Yes   Zostavax completed No   Shingrix Completed?: Yes  Screening Tests Health Maintenance  Topic Date Due   COVID-19 Vaccine (1) Never done   Pneumonia Vaccine 34+ Years old (2 - PPSV23 or PCV20) 05/03/2015   COLONOSCOPY (Pts 45-50yr Insurance coverage will need to be confirmed)  09/08/2018   INFLUENZA VACCINE  06/22/2022 (Originally 10/22/2021)   Medicare Annual Wellness (AWV)  02/08/2023   Hepatitis C Screening  Completed   Zoster Vaccines- Shingrix  Completed   HPV VACCINES  Aged Out    Health Maintenance  Health Maintenance Due  Topic Date Due   COVID-19 Vaccine (1) Never done   Pneumonia Vaccine 70 Years old (2 - PPSV23 or PCV20) 05/03/2015   COLONOSCOPY (Pts 45-433yrInsurance coverage will need to be confirmed)  09/08/2018    Colorectal cancer screening: Type of screening: Colonoscopy. Completed 09/08/2015. Repeat every 5 years. Plans to schedule later.   Lung Cancer Screening: (Low Dose CT Chest recommended if Age 70-80ears, 30 pack-year currently smoking OR have quit w/in 15years.) does not qualify.   Lung Cancer Screening Referral: n/a  Additional Screening:  Hepatitis C Screening: does qualify; Completed deferred  Vision Screening: Recommended annual ophthalmology exams for early detection of glaucoma and other disorders of the eye. Is the patient up to date with their annual eye exam?  Yes  Who is the provider or what is the name of the office in which the patient attends annual eye exams?  If pt is not established with a provider, would they like to be referred to a provider to establish care? No .   Dental Screening: Recommended annual dental exams for proper oral hygiene  Community Resource Referral / Chronic Care Management: CRR required this visit?  No   CCM required this visit?  No      Plan:  -Will obtain routine fasting labs. -Continue to follow-up with various specialists including  oncology. -Continue current medication regimen, no med changes today. -Discussed immunizations. -BP and pulse stable. -Follow-up in 6 months for reg OV- HTN, HLD, GERD  I have personally reviewed and noted the following in the patient's chart:   Medical and social history Use of alcohol, tobacco or illicit drugs  Current medications and supplements including opioid prescriptions. Patient is not currently taking opioid prescriptions. Functional ability and status Nutritional status Physical activity Advanced directives List of other physicians Hospitalizations, surgeries, and ER visits in previous 12 months Vitals Screenings to include cognitive, depression, and falls Referrals and appointments  In addition, I have reviewed and discussed with patient certain preventive protocols, quality metrics, and best practice recommendations. A written personalized care plan for preventive services as well as general preventive health recommendations were provided to patient.     MaLorrene ReidPA-C   02/07/2022   Nurse Notes: Patient declined Medicare Wellness screening questions.

## 2022-02-08 LAB — VITAMIN D 25 HYDROXY (VIT D DEFICIENCY, FRACTURES): Vit D, 25-Hydroxy: 55.3 ng/mL (ref 30.0–100.0)

## 2022-02-08 LAB — HEMOGLOBIN A1C
Est. average glucose Bld gHb Est-mCnc: 103 mg/dL
Hgb A1c MFr Bld: 5.2 % (ref 4.8–5.6)

## 2022-02-08 LAB — LIPID PANEL
Chol/HDL Ratio: 2.9 ratio (ref 0.0–5.0)
Cholesterol, Total: 170 mg/dL (ref 100–199)
HDL: 59 mg/dL (ref >39)
LDL Chol Calc (NIH): 85 mg/dL (ref 0–99)
Triglycerides: 153 mg/dL — ABNORMAL HIGH (ref 0–149)
VLDL Cholesterol Cal: 26 mg/dL (ref 5–40)

## 2022-02-08 LAB — CBC WITH DIFFERENTIAL/PLATELET
Basophils Absolute: 0.1 10*3/uL (ref 0.0–0.2)
Basos: 0 %
EOS (ABSOLUTE): 0 10*3/uL (ref 0.0–0.4)
Eos: 0 %
Hematocrit: 39.5 % (ref 37.5–51.0)
Hemoglobin: 12.7 g/dL — ABNORMAL LOW (ref 13.0–17.7)
Immature Grans (Abs): 0 10*3/uL (ref 0.0–0.1)
Immature Granulocytes: 0 %
Lymphocytes Absolute: 69.3 10*3/uL — ABNORMAL HIGH (ref 0.7–3.1)
Lymphs: 92 %
MCH: 31.5 pg (ref 26.6–33.0)
MCHC: 32.2 g/dL (ref 31.5–35.7)
MCV: 98 fL — ABNORMAL HIGH (ref 79–97)
Monocytes Absolute: 2.3 10*3/uL — ABNORMAL HIGH (ref 0.1–0.9)
Monocytes: 3 %
Neutrophils Absolute: 4.1 10*3/uL (ref 1.4–7.0)
Neutrophils: 5 %
Platelets: 212 10*3/uL (ref 150–450)
RBC: 4.03 x10E6/uL — ABNORMAL LOW (ref 4.14–5.80)
RDW: 12.2 % (ref 11.6–15.4)
WBC: 75.9 10*3/uL (ref 3.4–10.8)

## 2022-02-08 LAB — COMPREHENSIVE METABOLIC PANEL
ALT: 14 IU/L (ref 0–44)
AST: 24 IU/L (ref 0–40)
Albumin/Globulin Ratio: 2.3 — ABNORMAL HIGH (ref 1.2–2.2)
Albumin: 4.6 g/dL (ref 3.9–4.9)
Alkaline Phosphatase: 69 IU/L (ref 44–121)
BUN/Creatinine Ratio: 12 (ref 10–24)
BUN: 9 mg/dL (ref 8–27)
Bilirubin Total: 0.4 mg/dL (ref 0.0–1.2)
CO2: 24 mmol/L (ref 20–29)
Calcium: 9.3 mg/dL (ref 8.6–10.2)
Chloride: 97 mmol/L (ref 96–106)
Creatinine, Ser: 0.76 mg/dL (ref 0.76–1.27)
Globulin, Total: 2 g/dL (ref 1.5–4.5)
Glucose: 77 mg/dL (ref 70–99)
Potassium: 4.5 mmol/L (ref 3.5–5.2)
Sodium: 135 mmol/L (ref 134–144)
Total Protein: 6.6 g/dL (ref 6.0–8.5)
eGFR: 97
eGFR: 97 mL/min/{1.73_m2} (ref >59)

## 2022-02-08 LAB — BASIC METABOLIC PANEL
Creatinine: 0.8 (ref 0.6–1.3)
Glucose: 77

## 2022-02-08 LAB — CBC AND DIFFERENTIAL
HCT: 40 — AB (ref 41–53)
Hemoglobin: 12.7 — AB (ref 13.5–17.5)

## 2022-02-17 DIAGNOSIS — H25013 Cortical age-related cataract, bilateral: Secondary | ICD-10-CM | POA: Diagnosis not present

## 2022-02-17 DIAGNOSIS — H2513 Age-related nuclear cataract, bilateral: Secondary | ICD-10-CM | POA: Diagnosis not present

## 2022-02-17 DIAGNOSIS — H25041 Posterior subcapsular polar age-related cataract, right eye: Secondary | ICD-10-CM | POA: Diagnosis not present

## 2022-02-17 DIAGNOSIS — H35363 Drusen (degenerative) of macula, bilateral: Secondary | ICD-10-CM | POA: Diagnosis not present

## 2022-03-27 ENCOUNTER — Other Ambulatory Visit: Payer: Self-pay

## 2022-03-27 ENCOUNTER — Other Ambulatory Visit (HOSPITAL_COMMUNITY): Payer: Self-pay

## 2022-03-27 ENCOUNTER — Other Ambulatory Visit: Payer: Self-pay | Admitting: Physician Assistant

## 2022-03-27 DIAGNOSIS — I10 Essential (primary) hypertension: Secondary | ICD-10-CM

## 2022-03-27 DIAGNOSIS — K219 Gastro-esophageal reflux disease without esophagitis: Secondary | ICD-10-CM

## 2022-03-27 DIAGNOSIS — E782 Mixed hyperlipidemia: Secondary | ICD-10-CM

## 2022-03-27 MED ORDER — ROSUVASTATIN CALCIUM 40 MG PO TABS
40.0000 mg | ORAL_TABLET | Freq: Every day | ORAL | 1 refills | Status: DC
  Filled 2022-03-27 – 2022-04-01 (×3): qty 90, 90d supply, fill #0
  Filled 2022-06-26: qty 90, 90d supply, fill #1

## 2022-03-27 MED ORDER — ESOMEPRAZOLE MAGNESIUM 20 MG PO CPDR
20.0000 mg | DELAYED_RELEASE_CAPSULE | Freq: Every day | ORAL | 1 refills | Status: DC
  Filled 2022-03-27 – 2022-04-01 (×3): qty 90, 90d supply, fill #0
  Filled 2022-06-26: qty 90, 90d supply, fill #1

## 2022-03-27 MED ORDER — LOSARTAN POTASSIUM 50 MG PO TABS
50.0000 mg | ORAL_TABLET | Freq: Every day | ORAL | 1 refills | Status: DC
  Filled 2022-03-27 – 2022-04-29 (×3): qty 90, 90d supply, fill #0
  Filled 2022-07-30: qty 90, 90d supply, fill #1

## 2022-03-28 ENCOUNTER — Other Ambulatory Visit (HOSPITAL_COMMUNITY): Payer: Self-pay

## 2022-04-01 ENCOUNTER — Other Ambulatory Visit: Payer: Self-pay

## 2022-04-01 ENCOUNTER — Other Ambulatory Visit (HOSPITAL_COMMUNITY): Payer: Self-pay

## 2022-04-08 ENCOUNTER — Other Ambulatory Visit (HOSPITAL_COMMUNITY): Payer: Self-pay

## 2022-04-21 DIAGNOSIS — Z85828 Personal history of other malignant neoplasm of skin: Secondary | ICD-10-CM | POA: Diagnosis not present

## 2022-04-21 DIAGNOSIS — L570 Actinic keratosis: Secondary | ICD-10-CM | POA: Diagnosis not present

## 2022-04-21 DIAGNOSIS — L578 Other skin changes due to chronic exposure to nonionizing radiation: Secondary | ICD-10-CM | POA: Diagnosis not present

## 2022-04-21 DIAGNOSIS — D225 Melanocytic nevi of trunk: Secondary | ICD-10-CM | POA: Diagnosis not present

## 2022-04-21 DIAGNOSIS — L821 Other seborrheic keratosis: Secondary | ICD-10-CM | POA: Diagnosis not present

## 2022-04-21 DIAGNOSIS — L814 Other melanin hyperpigmentation: Secondary | ICD-10-CM | POA: Diagnosis not present

## 2022-04-29 ENCOUNTER — Other Ambulatory Visit (HOSPITAL_COMMUNITY): Payer: Self-pay

## 2022-04-30 ENCOUNTER — Other Ambulatory Visit (HOSPITAL_COMMUNITY): Payer: Self-pay

## 2022-05-16 ENCOUNTER — Other Ambulatory Visit (HOSPITAL_COMMUNITY): Payer: Self-pay

## 2022-05-16 ENCOUNTER — Other Ambulatory Visit: Payer: Self-pay

## 2022-05-16 ENCOUNTER — Encounter: Payer: Self-pay | Admitting: Family Medicine

## 2022-05-16 ENCOUNTER — Ambulatory Visit (INDEPENDENT_AMBULATORY_CARE_PROVIDER_SITE_OTHER): Payer: PPO | Admitting: Family Medicine

## 2022-05-16 VITALS — BP 112/78 | HR 82 | Resp 18 | Ht 68.0 in | Wt 159.0 lb

## 2022-05-16 DIAGNOSIS — W57XXXA Bitten or stung by nonvenomous insect and other nonvenomous arthropods, initial encounter: Secondary | ICD-10-CM

## 2022-05-16 DIAGNOSIS — S70262A Insect bite (nonvenomous), left hip, initial encounter: Secondary | ICD-10-CM

## 2022-05-16 MED ORDER — MUPIROCIN 2 % EX OINT
1.0000 | TOPICAL_OINTMENT | Freq: Two times a day (BID) | CUTANEOUS | 12 refills | Status: DC
  Filled 2022-05-16: qty 22, 11d supply, fill #0

## 2022-05-16 MED ORDER — DOXYCYCLINE HYCLATE 100 MG PO TABS
100.0000 mg | ORAL_TABLET | Freq: Two times a day (BID) | ORAL | 0 refills | Status: AC
  Filled 2022-05-16: qty 20, 10d supply, fill #0

## 2022-05-16 NOTE — Progress Notes (Signed)
Acute Office Visit  Subjective:     Patient ID: George [name], male    DOB: 10/10/1950, 72 y.o.   MRN: [id number]  Chief Complaint  Patient presents with   Insect Bite    Tick, left hip    HPI Patient is in today for tick bite.  He was in some long weeds working over the weekend and believes that he was bit on Saturday.  He did not notice until Sunday morning.  There are 2 spots on his left hip, the tick was still attached on the upper spot when he noticed on Sunday.  He removed it and washed the area with soap and water, rubbing alcohol, applied mupirocin ointment and put a bandage on top.  It did seem to develop some redness and cellulitis around especially the top spot over the next several days.  He had an episode like this in 2018 and was prescribed doxycycline at the time and is requesting a course of that again.  Of note, his chart does list that he has an allergy to doxycycline.  Upon further conversation, he said that he was taking the previous course of doxycycline and was on day 8 when he went on a camping trip and was out in the sun without sunscreen.  He developed skin redness.  He also then later developed a sore throat and a red tongue.  He is not sure if these were due to to the doxycycline or if it was due to something that he encountered while he was on his camping trip.  Review of Systems  Constitutional:  Negative for chills, diaphoresis, fever and malaise/fatigue.  Respiratory:  Negative for cough and shortness of breath.   Cardiovascular:  Negative for chest pain and palpitations.  Musculoskeletal:  Negative for myalgias.  Skin:  Positive for rash (2 red spots on left hip, tick was attached to top spot but he removed it). Negative for itching.      Objective:    BP 112/78 (BP Location: Left Arm, Patient Position: Sitting, Cuff Size: Normal)   Pulse 82   Resp 18   Ht '5\' 8"'$  (1.727 m)   Wt 159 lb (72.1 kg)   SpO2 98%   BMI 24.18 kg/m   Physical Exam Vitals  reviewed.  Constitutional:      General: He is not in acute distress.    Appearance: Normal appearance. He is not ill-appearing.  HENT:     Head: Normocephalic and atraumatic.     Nose: Nose normal.  Eyes:     Conjunctiva/sclera: Conjunctivae normal.  Pulmonary:     Effort: Pulmonary effort is normal.  Musculoskeletal:     Cervical back: Normal range of motion.  Skin:    Coloration: Skin is not jaundiced or pale.     Findings: No bruising.  Neurological:     Mental Status: He is alert and oriented to person, place, and time.  Psychiatric:        Mood and Affect: Mood normal.      Assessment & Plan:  Tick bite of left hip, initial encounter -     Doxycycline Hyclate; Take 1 tablet (100 mg total) by mouth 2 (two) times daily for 10 days.  Dispense: 20 tablet; Refill: 0  Other orders -     Mupirocin; Apply topically twice daily  Dispense: 22 g; Refill: 12  I did discuss his potential allergy to doxycycline with him and the risks of starting it.  He would  like to try the course of doxycycline since he is not sure that that is what caused the symptoms that he reported last time.  We did discuss that it is very important for him to discontinue the medication and let me know if he does develop any signs or symptoms of an allergic reaction.  Recommended an antihistamine like Benadryl in addition to stopping the doxycycline if he does seem to be allergic.  Patient expressed understanding.  He is agreeable to the plan.  Return if symptoms worsen or fail to improve.  Velva Harman, PA

## 2022-05-16 NOTE — Patient Instructions (Signed)
If you develop any signs of an allergic reaction like red skin, red tongue, sore throat discontinue the medication immediately.

## 2022-05-29 DIAGNOSIS — H35033 Hypertensive retinopathy, bilateral: Secondary | ICD-10-CM | POA: Diagnosis not present

## 2022-05-29 DIAGNOSIS — H25813 Combined forms of age-related cataract, bilateral: Secondary | ICD-10-CM | POA: Diagnosis not present

## 2022-05-29 DIAGNOSIS — H524 Presbyopia: Secondary | ICD-10-CM | POA: Diagnosis not present

## 2022-05-29 DIAGNOSIS — H35362 Drusen (degenerative) of macula, left eye: Secondary | ICD-10-CM | POA: Diagnosis not present

## 2022-05-29 DIAGNOSIS — H25811 Combined forms of age-related cataract, right eye: Secondary | ICD-10-CM | POA: Diagnosis not present

## 2022-06-26 ENCOUNTER — Other Ambulatory Visit (HOSPITAL_COMMUNITY): Payer: Self-pay

## 2022-06-26 ENCOUNTER — Other Ambulatory Visit: Payer: Self-pay

## 2022-07-23 DIAGNOSIS — H25811 Combined forms of age-related cataract, right eye: Secondary | ICD-10-CM | POA: Diagnosis not present

## 2022-07-23 DIAGNOSIS — H268 Other specified cataract: Secondary | ICD-10-CM | POA: Diagnosis not present

## 2022-07-30 ENCOUNTER — Other Ambulatory Visit (HOSPITAL_COMMUNITY): Payer: Self-pay

## 2022-08-08 ENCOUNTER — Other Ambulatory Visit (HOSPITAL_COMMUNITY): Payer: Self-pay

## 2022-08-08 ENCOUNTER — Other Ambulatory Visit: Payer: Self-pay

## 2022-08-08 ENCOUNTER — Ambulatory Visit (INDEPENDENT_AMBULATORY_CARE_PROVIDER_SITE_OTHER): Payer: PPO | Admitting: Family Medicine

## 2022-08-08 ENCOUNTER — Encounter: Payer: Self-pay | Admitting: Family Medicine

## 2022-08-08 VITALS — BP 116/73 | HR 83 | Resp 18 | Ht 68.0 in | Wt 156.0 lb

## 2022-08-08 DIAGNOSIS — I10 Essential (primary) hypertension: Secondary | ICD-10-CM

## 2022-08-08 DIAGNOSIS — Z1211 Encounter for screening for malignant neoplasm of colon: Secondary | ICD-10-CM

## 2022-08-08 DIAGNOSIS — K219 Gastro-esophageal reflux disease without esophagitis: Secondary | ICD-10-CM | POA: Diagnosis not present

## 2022-08-08 DIAGNOSIS — E782 Mixed hyperlipidemia: Secondary | ICD-10-CM

## 2022-08-08 MED ORDER — LOSARTAN POTASSIUM 50 MG PO TABS
50.0000 mg | ORAL_TABLET | Freq: Every day | ORAL | 1 refills | Status: DC
Start: 2022-08-08 — End: 2023-02-10
  Filled 2022-08-08 – 2022-10-23 (×2): qty 90, 90d supply, fill #0
  Filled 2022-12-26 – 2023-02-04 (×2): qty 90, 90d supply, fill #1

## 2022-08-08 MED ORDER — ROSUVASTATIN CALCIUM 40 MG PO TABS
40.0000 mg | ORAL_TABLET | Freq: Every day | ORAL | 1 refills | Status: DC
Start: 2022-08-08 — End: 2023-02-10
  Filled 2022-08-08 – 2022-09-29 (×2): qty 90, 90d supply, fill #0
  Filled 2022-12-26: qty 90, 90d supply, fill #1

## 2022-08-08 MED ORDER — MUPIROCIN 2 % EX OINT
1.0000 | TOPICAL_OINTMENT | Freq: Two times a day (BID) | CUTANEOUS | 12 refills | Status: DC
  Filled 2022-08-08: qty 22, 11d supply, fill #0

## 2022-08-08 MED ORDER — ESOMEPRAZOLE MAGNESIUM 20 MG PO CPDR
20.0000 mg | DELAYED_RELEASE_CAPSULE | Freq: Every day | ORAL | 1 refills | Status: DC
Start: 2022-08-08 — End: 2023-02-10
  Filled 2022-08-08 – 2022-10-02 (×2): qty 90, 90d supply, fill #0
  Filled 2022-12-26: qty 90, 90d supply, fill #1

## 2022-08-08 NOTE — Assessment & Plan Note (Signed)
Stable.  We discussed decreasing his Nexium regimen since he has been taking it for so long and has not tried to decrease his dose.  Patient opted for changing to Nexium 20 mg every other day instead of daily.

## 2022-08-08 NOTE — Assessment & Plan Note (Signed)
BP goal <130/80.  Stable, at goal.  Continue losartan 50 mg daily and low-sodium diet.  Collecting CMP for medication monitoring.  Will continue to monitor.

## 2022-08-08 NOTE — Patient Instructions (Signed)
It was good to see you today, keep up the good work!  Try switching your Nexium dose to every other day.  It is also safe to take and an over-the-counter antiacid like Tums if you do have any breakthrough symptoms.

## 2022-08-08 NOTE — Assessment & Plan Note (Signed)
Last lipid panel: LDL 85, HDL 59, triglycerides 153.  Continue rosuvastatin 40 mg daily and low-fat diet.  Repeat lipid panel and CMP for hepatic function today.  Will continue to monitor.

## 2022-08-08 NOTE — Progress Notes (Signed)
Established Patient Office Visit  Subjective   Patient ID: George [name], male    DOB: Jul 31, 1950  Age: 72 y.o. MRN: [id number]  Chief Complaint  Patient presents with   Hyperlipidemia   Hypertension   Gastroesophageal Reflux    HPI George [name] is a 72 y.o. male presenting today for follow up of hypertension, hyperlipidemia, GERD. Hypertension: Patient here for follow-up of elevated blood pressure. He is not exercising after his cataract surgery but plans to get back into it once the weather cooperates and is adherent to low salt diet.   Pt denies chest pain, SOB, dizziness, edema, syncope, fatigue or heart palpitations. Taking losartan, reports excellent compliance with treatment. Denies side effects. Hyperlipidemia: tolerating rosuvastatin well with no myalgias or significant side effects. The 10-year ASCVD risk score (Arnett DK, et al., 2019) is: 16.5% GERD: Currently taking Nexium 20 mg daily.  He has been taking Nexium for approximately 10 years now.  He does not have any symptoms currently.  ROS Negative unless otherwise noted in HPI   Objective:     BP 116/73 (BP Location: Left Arm, Patient Position: Sitting, Cuff Size: Normal)   Pulse 83   Resp 18   Ht 5\' 8"  (1.727 m)   Wt 156 lb (70.8 kg)   SpO2 96%   BMI 23.72 kg/m   Physical Exam Constitutional:      General: He is not in acute distress.    Appearance: Normal appearance.  HENT:     Head: Normocephalic and atraumatic.  Cardiovascular:     Rate and Rhythm: Normal rate and regular rhythm.     Pulses: Normal pulses.     Heart sounds: Normal heart sounds. No murmur heard.    No friction rub. No gallop.  Pulmonary:     Effort: Pulmonary effort is normal.     Breath sounds: Normal breath sounds. No wheezing, rhonchi or rales.  Skin:    General: Skin is warm and dry.  Neurological:     Mental Status: He is alert and oriented to person, place, and time.  Psychiatric:        Mood and Affect: Mood normal.      Assessment & Plan:  Essential hypertension Assessment & Plan: BP goal <130/80.  Stable, at goal.  Continue losartan 50 mg daily and low-sodium diet.  Collecting CMP for medication monitoring.  Will continue to monitor.  Orders: -     CBC with Differential/Platelet; Future -     Comprehensive metabolic panel; Future -     Losartan Potassium; Take 1 tablet (50 mg total) by mouth daily.  Dispense: 90 tablet; Refill: 1  Hyperlipidemia, mixed Assessment & Plan: Last lipid panel: LDL 85, HDL 59, triglycerides 153.  Continue rosuvastatin 40 mg daily and low-fat diet.  Repeat lipid panel and CMP for hepatic function today.  Will continue to monitor.  Orders: -     Lipid panel; Future -     Rosuvastatin Calcium; Take 1 tablet (40 mg total) by mouth daily.  Dispense: 90 tablet; Refill: 1  Gastroesophageal reflux disease, unspecified whether esophagitis present Assessment & Plan: Stable.  We discussed decreasing his Nexium regimen since he has been taking it for so long and has not tried to decrease his dose.  Patient opted for changing to Nexium 20 mg every other day instead of daily.  Orders: -     Esomeprazole Magnesium; Take 1 capsule (20 mg total) by mouth daily.  Dispense: 90 capsule; Refill:  1  Screening for colorectal cancer -     Ambulatory referral to Gastroenterology  Other orders -     Mupirocin; Apply topically twice daily  Dispense: 22 g; Refill: 12  Patient has a history of colon polyps found on colonoscopy in 2015 with recommendation to follow-up in 5 years.  This follow-up fell in the middle of COVID and was never done.  Since that time, he has been diagnosed with skin cancer and undergoing treatment, had cataract surgery.  He would like to complete the colonoscopy at this point.  Return in about 6 months (around 02/08/2023) for follow-up for HTN, HLD, GERD, fasting blood work 1 week before.    Melida Quitter, PA

## 2022-08-09 ENCOUNTER — Telehealth: Payer: Self-pay | Admitting: Physician Assistant

## 2022-08-09 LAB — CBC WITH DIFFERENTIAL/PLATELET
Basophils Absolute: 0.1 10*3/uL (ref 0.0–0.2)
Basos: 0 %
EOS (ABSOLUTE): 0.1 10*3/uL (ref 0.0–0.4)
Eos: 0 %
Hematocrit: 40.1 % (ref 37.5–51.0)
Hemoglobin: 12.9 g/dL — ABNORMAL LOW (ref 13.0–17.7)
Immature Grans (Abs): 0 10*3/uL (ref 0.0–0.1)
Immature Granulocytes: 0 %
Lymphocytes Absolute: 82 10*3/uL — ABNORMAL HIGH (ref 0.7–3.1)
Lymphs: 90 %
MCH: 32 pg (ref 26.6–33.0)
MCHC: 32.2 g/dL (ref 31.5–35.7)
MCV: 100 fL — ABNORMAL HIGH (ref 79–97)
Monocytes Absolute: 3.3 10*3/uL — ABNORMAL HIGH (ref 0.1–0.9)
Monocytes: 4 %
Neutrophils Absolute: 5.8 10*3/uL (ref 1.4–7.0)
Neutrophils: 6 %
Platelets: 161 10*3/uL (ref 150–450)
RBC: 4.03 x10E6/uL — ABNORMAL LOW (ref 4.14–5.80)
RDW: 12 % (ref 11.6–15.4)
WBC: 91.4 10*3/uL (ref 3.4–10.8)

## 2022-08-09 LAB — COMPREHENSIVE METABOLIC PANEL
ALT: 18 IU/L (ref 0–44)
AST: 34 IU/L (ref 0–40)
Albumin/Globulin Ratio: 2.7 — ABNORMAL HIGH (ref 1.2–2.2)
Albumin: 4.8 g/dL (ref 3.8–4.8)
Alkaline Phosphatase: 83 IU/L (ref 44–121)
BUN/Creatinine Ratio: 11 (ref 10–24)
BUN: 10 mg/dL (ref 8–27)
Bilirubin Total: 1 mg/dL (ref 0.0–1.2)
CO2: 22 mmol/L (ref 20–29)
Calcium: 9.5 mg/dL (ref 8.6–10.2)
Chloride: 94 mmol/L — ABNORMAL LOW (ref 96–106)
Creatinine, Ser: 0.94 mg/dL (ref 0.76–1.27)
Globulin, Total: 1.8 g/dL (ref 1.5–4.5)
Glucose: 70 mg/dL (ref 70–99)
Potassium: 5 mmol/L (ref 3.5–5.2)
Sodium: 132 mmol/L — ABNORMAL LOW (ref 134–144)
Total Protein: 6.6 g/dL (ref 6.0–8.5)
eGFR: 87 mL/min/{1.73_m2} (ref >59)

## 2022-08-09 LAB — LIPID PANEL
Chol/HDL Ratio: 2.3 ratio (ref 0.0–5.0)
Cholesterol, Total: 174 mg/dL (ref 100–199)
HDL: 75 mg/dL
LDL Chol Calc (NIH): 91 mg/dL (ref 0–99)
Triglycerides: 38 mg/dL (ref 0–149)
VLDL Cholesterol Cal: 8 mg/dL (ref 5–40)

## 2022-08-09 NOTE — Telephone Encounter (Signed)
[facility] After Hours Nurse called with critical WBC of 93,000. After review of chart patient has CLL. No immediate intervention is needed today. It does appear that WBC is trending up from his baseline. Will route to PCP for further management.

## 2022-08-25 ENCOUNTER — Telehealth: Payer: Self-pay

## 2022-08-25 ENCOUNTER — Inpatient Hospital Stay: Payer: PPO | Attending: Hematology & Oncology

## 2022-08-25 ENCOUNTER — Inpatient Hospital Stay (HOSPITAL_BASED_OUTPATIENT_CLINIC_OR_DEPARTMENT_OTHER): Payer: PPO | Admitting: Hematology & Oncology

## 2022-08-25 ENCOUNTER — Encounter: Payer: Self-pay | Admitting: Hematology & Oncology

## 2022-08-25 VITALS — BP 141/82 | HR 74 | Temp 98.4°F | Resp 17 | Wt 159.8 lb

## 2022-08-25 DIAGNOSIS — C9110 Chronic lymphocytic leukemia of B-cell type not having achieved remission: Secondary | ICD-10-CM | POA: Insufficient documentation

## 2022-08-25 LAB — CMP (CANCER CENTER ONLY)
ALT: 17 U/L (ref 0–44)
AST: 25 U/L (ref 15–41)
Albumin: 4.6 g/dL (ref 3.5–5.0)
Alkaline Phosphatase: 60 U/L (ref 38–126)
Anion gap: 7 (ref 5–15)
BUN: 7 mg/dL — ABNORMAL LOW (ref 8–23)
CO2: 27 mmol/L (ref 22–32)
Calcium: 9.1 mg/dL (ref 8.9–10.3)
Chloride: 99 mmol/L (ref 98–111)
Creatinine: 0.78 mg/dL (ref 0.61–1.24)
GFR, Estimated: >60 mL/min (ref >60)
Glucose, Bld: 97 mg/dL (ref 70–99)
Potassium: 4.3 mmol/L (ref 3.5–5.1)
Sodium: 133 mmol/L — ABNORMAL LOW (ref 135–145)
Total Bilirubin: 0.4 mg/dL (ref 0.3–1.2)
Total Protein: 6.6 g/dL (ref 6.5–8.1)

## 2022-08-25 LAB — CBC WITH DIFFERENTIAL (CANCER CENTER ONLY)
Abs Immature Granulocytes: 0.13 10*3/uL — ABNORMAL HIGH (ref 0.00–0.07)
Basophils Absolute: 0.2 10*3/uL — ABNORMAL HIGH (ref 0.0–0.1)
Basophils Relative: 0 %
Eosinophils Absolute: 0.1 10*3/uL (ref 0.0–0.5)
Eosinophils Relative: 0 %
HCT: 37.2 % — ABNORMAL LOW (ref 39.0–52.0)
Hemoglobin: 11.7 g/dL — ABNORMAL LOW (ref 13.0–17.0)
Immature Granulocytes: 0 %
Lymphocytes Relative: 91 %
Lymphs Abs: 77.5 10*3/uL — ABNORMAL HIGH (ref 0.7–4.0)
MCH: 31.8 pg (ref 26.0–34.0)
MCHC: 31.5 g/dL (ref 30.0–36.0)
MCV: 101.1 fL — ABNORMAL HIGH (ref 80.0–100.0)
Monocytes Absolute: 3.2 10*3/uL — ABNORMAL HIGH (ref 0.1–1.0)
Monocytes Relative: 4 %
Neutro Abs: 3.9 10*3/uL (ref 1.7–7.7)
Neutrophils Relative %: 5 %
Platelet Count: 232 10*3/uL (ref 150–400)
RBC: 3.68 MIL/uL — ABNORMAL LOW (ref 4.22–5.81)
RDW: 13.2 % (ref 11.5–15.5)
Smear Review: NORMAL
WBC Count: 85 10*3/uL (ref 4.0–10.5)
nRBC: 0 % (ref 0.0–0.2)

## 2022-08-25 LAB — LACTATE DEHYDROGENASE: LDH: 76 U/L — ABNORMAL LOW (ref 98–192)

## 2022-08-25 NOTE — Telephone Encounter (Signed)
Received critical from Gery Pray in the lab who stated pts WBC count was 85. Dr Myna Hidalgo made aware.

## 2022-08-25 NOTE — Progress Notes (Signed)
Hematology and Oncology Follow Up Visit  George [name] [id number] [birth year] 72 y.o. 08/25/2022   Principle Diagnosis:  CLL-stage A  Current Therapy:   Observation     Interim History:  Mr.  George [name] is back for followup.  I see him yearly.  Overall, he is quite well.  He really has had no complaints since we last saw him.  As always, he and his wife will be going camping.  [place]  I am sure they will have a wonderful time.  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  This seems to be helping quite a bit.  He has not noted any swollen lymph nodes.  He has had no cough or shortness of breath.  He has had no issues with COVID.  He has had no change in bowel or bladder habits.  He has had no bleeding.  He has had no rashes.  Overall, I would have to say that his performance status is ECOG 0.    Medications:  Current Outpatient Medications:    cholecalciferol (VITAMIN D) 1000 UNITS tablet, Take 1,000 Units by mouth daily. 2 tablets daily, Disp: , Rfl:    Coenzyme Q10 (CO Q 10 PO), Take by mouth every morning. , Disp: , Rfl:    esomeprazole (NEXIUM) 20 MG capsule, Take 1 capsule (20 mg total) by mouth daily., Disp: 90 capsule, Rfl: 1   fluorouracil (EFUDEX) 5 % cream, Apply to the affected area(s) once a day for 2 weeks as directed (Patient taking differently: Apply topically. PRN for precancerous spots), Disp: 40 g, Rfl: 0   fluticasone (FLONASE) 50 MCG/ACT nasal spray, 1 spray each nostril following sinus rinses twice daily, Disp: 16 g, Rfl: 2   GLUCOSAMINE HCL PO, Take by mouth every morning. , Disp: , Rfl:    losartan (COZAAR) 50 MG tablet, Take 1 tablet (50 mg total) by mouth daily., Disp: 90 tablet, Rfl: 1   MAGNESIUM PO, Take by mouth., Disp: , Rfl:    mupirocin ointment (BACTROBAN) 2 %, Apply topically twice daily, Disp: 22 g, Rfl: 12   NIACIN CR PO, Take 500 mg by mouth daily. , Disp: , Rfl:    Omega-3 Fatty Acids (FISH  OIL CONCENTRATE PO), Take by mouth every morning. , Disp: , Rfl:    Probiotic Product (PROBIOTIC PO), Take by mouth., Disp: , Rfl:    psyllium (METAMUCIL) 58.6 % powder, Take 1 packet by mouth 2 (two) times daily., Disp: , Rfl:    rosuvastatin (CRESTOR) 40 MG tablet, Take 1 tablet (40 mg total) by mouth daily., Disp: 90 tablet, Rfl: 1   Zinc 30 MG TABS, Take by mouth every morning., Disp: , Rfl:    fluorouracil (EFUDEX) 5 % cream, Apply to affected area once a day 1 hour before bedtime (Patient not taking: Reported on 08/25/2022), Disp: 40 g, Rfl: 0  Allergies:  Allergies  Allergen Reactions   Doxycycline Other (See Comments)    Sore throat, red tongue, red skin  Patient states that he was also on a camping trip when the symptoms developed, so he is unsure if they were due to the doxycycline or something that he encountered the mountains.    Past Medical History, Surgical history, Social history, and Family History were reviewed and updated.  Review of Systems: Review of Systems  Constitutional: Negative.   HENT: Negative.    Eyes: Negative.   Respiratory: Negative.  Cardiovascular: Negative.   Gastrointestinal: Negative.   Genitourinary: Negative.   Musculoskeletal: Negative.   Skin: Negative.   Neurological: Negative.   Endo/Heme/Allergies: Negative.   Psychiatric/Behavioral: Negative.       Physical Exam:  weight is 159 lb 12.8 oz (72.5 kg). His oral temperature is 98.4 F (36.9 C). His blood pressure is 141/82 (abnormal) and his pulse is 74. His respiration is 17 and oxygen saturation is 99%.   Physical Exam Vitals reviewed.  Constitutional:      Comments: In the right axilla, he does have a small lymph node.  It measures about 5 mm.  It is mobile and nontender.  In the left axilla, there is a large lymph node that measures about 1-1.5 cm.  It is mobile and nontender.  HENT:     Head: Normocephalic and atraumatic.  Eyes:     Pupils: Pupils are equal, round, and  reactive to light.  Cardiovascular:     Rate and Rhythm: Normal rate and regular rhythm.     Heart sounds: Normal heart sounds.  Pulmonary:     Effort: Pulmonary effort is normal.     Breath sounds: Normal breath sounds.  Abdominal:     General: Bowel sounds are normal.     Palpations: Abdomen is soft.  Musculoskeletal:        General: No tenderness or deformity. Normal range of motion.     Cervical back: Normal range of motion.  Lymphadenopathy:     Cervical: No cervical adenopathy.  Skin:    General: Skin is warm and dry.     Findings: No erythema or rash.  Neurological:     Mental Status: He is alert and oriented to person, place, and time.  Psychiatric:        Behavior: Behavior normal.        Thought Content: Thought content normal.        Judgment: Judgment normal.      Lab Results  Component Value Date   WBC 91.4 (HH) 08/08/2022   HGB 12.9 (L) 08/08/2022   HCT 40.1 08/08/2022   MCV 100 (H) 08/08/2022   PLT 161 08/08/2022     Chemistry      Component Value Date/Time   NA 133 (L) 08/25/2022 0927   NA 132 (L) 08/08/2022 1005   K 4.3 08/25/2022 0927   CL 99 08/25/2022 0927   CO2 27 08/25/2022 0927   BUN 7 (L) 08/25/2022 0927   BUN 10 08/08/2022 1005   CREATININE 0.78 08/25/2022 0927   CREATININE 0.73 10/15/2015 0909      Component Value Date/Time   CALCIUM 9.1 08/25/2022 0927   ALKPHOS 60 08/25/2022 0927   AST 25 08/25/2022 0927   ALT 17 08/25/2022 0927   BILITOT 0.4 08/25/2022 0927      Impression and Plan: Mr. George [name] is a 72 year old gentleman. He has CLL.  It does look like his disease might be more active now.  We have been seeing him for about 16 years.   His white blood cell count is going up slowly.  However, he is not anemic or thrombocytopenic.  As such, we can still follow him along.  He does have some palpable axillary lymph nodes which were not symptomatic.  At some point, I am sure that we will have to treat him.  But, for right now, we  can just follow along and see how things are going.  We will plan to get him back in probably  6 months now.  I think going to have to see him a little bit more often.   Josph Macho, MD 6/3/202410:33 AM

## 2022-08-26 LAB — IGG, IGA, IGM
IgA: 65 mg/dL (ref 61–437)
IgG (Immunoglobin G), Serum: 586 mg/dL — ABNORMAL LOW (ref 603–1613)
IgM (Immunoglobulin M), Srm: 35 mg/dL (ref 15–143)

## 2022-09-15 ENCOUNTER — Other Ambulatory Visit (HOSPITAL_COMMUNITY): Payer: Self-pay

## 2022-09-15 DIAGNOSIS — L570 Actinic keratosis: Secondary | ICD-10-CM | POA: Diagnosis not present

## 2022-09-15 MED ORDER — FLUOROURACIL 5 % EX CREA
1.0000 | TOPICAL_CREAM | Freq: Every day | CUTANEOUS | 0 refills | Status: DC
  Filled 2022-09-15: qty 40, 30d supply, fill #0

## 2022-09-19 ENCOUNTER — Other Ambulatory Visit: Payer: PPO

## 2022-09-19 ENCOUNTER — Ambulatory Visit: Payer: PPO | Admitting: Hematology & Oncology

## 2022-09-29 ENCOUNTER — Other Ambulatory Visit (HOSPITAL_COMMUNITY): Payer: Self-pay

## 2022-09-29 ENCOUNTER — Encounter: Payer: Self-pay | Admitting: Family Medicine

## 2022-09-29 ENCOUNTER — Other Ambulatory Visit: Payer: Self-pay

## 2022-10-02 ENCOUNTER — Other Ambulatory Visit (HOSPITAL_COMMUNITY): Payer: Self-pay

## 2022-10-20 DIAGNOSIS — D485 Neoplasm of uncertain behavior of skin: Secondary | ICD-10-CM | POA: Diagnosis not present

## 2022-10-20 DIAGNOSIS — L570 Actinic keratosis: Secondary | ICD-10-CM | POA: Diagnosis not present

## 2022-10-20 DIAGNOSIS — C44319 Basal cell carcinoma of skin of other parts of face: Secondary | ICD-10-CM | POA: Diagnosis not present

## 2022-10-23 ENCOUNTER — Other Ambulatory Visit (HOSPITAL_COMMUNITY): Payer: Self-pay

## 2022-10-23 ENCOUNTER — Other Ambulatory Visit: Payer: Self-pay

## 2022-11-26 DIAGNOSIS — C44311 Basal cell carcinoma of skin of nose: Secondary | ICD-10-CM | POA: Diagnosis not present

## 2022-12-26 ENCOUNTER — Other Ambulatory Visit (HOSPITAL_COMMUNITY): Payer: Self-pay

## 2022-12-26 ENCOUNTER — Other Ambulatory Visit: Payer: Self-pay

## 2022-12-26 ENCOUNTER — Telehealth: Payer: Self-pay | Admitting: *Deleted

## 2022-12-26 DIAGNOSIS — R0989 Other specified symptoms and signs involving the circulatory and respiratory systems: Secondary | ICD-10-CM

## 2022-12-26 MED ORDER — GUAIFENESIN ER 600 MG PO TB12
600.0000 mg | ORAL_TABLET | Freq: Two times a day (BID) | ORAL | 0 refills | Status: AC
Start: 2022-12-26 — End: 2023-01-05
  Filled 2022-12-26: qty 20, 10d supply, fill #0

## 2022-12-26 NOTE — Telephone Encounter (Signed)
Pt informed of below.  

## 2022-12-26 NOTE — Telephone Encounter (Signed)
Pt calling to say that he would like to see if provider would be willing to call in some medication.  He mentioned a z-pack.  He is having cold like symptoms that started 3 days ago.  He said his wife gave it to him.  He is having chest congestion, headache, no fever.  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  Informed him that usually they don't send in medication without evaluating a pt.  He would like it to be sent to [facility] if something can be sent in. Also any recommendations you may have for this as well.

## 2022-12-26 NOTE — Telephone Encounter (Signed)
LVM for pt to call office to inform him of below.  

## 2022-12-26 NOTE — Telephone Encounter (Signed)
At this point in time, it is more likely that his symptoms are due to a viral infection than a bacterial infection, so antibiotics would not be effective.  For symptoms, he can take guaifenesin to break up the mucus in his chest.  He does need to be drinking a ton of water both for recovery from symptoms as well as preventing dry eyes, dry mouth, etc. while taking the guaifenesin.  He can also take Tylenol or ibuprofen for the headache and any muscle aches or sore throat if he has those symptoms.  If he has not had any improvement in symptoms by Monday, at that point I do recommend making an appointment to be evaluated to determine the appropriate course of action.

## 2023-01-05 ENCOUNTER — Other Ambulatory Visit: Payer: Self-pay

## 2023-01-05 ENCOUNTER — Ambulatory Visit (INDEPENDENT_AMBULATORY_CARE_PROVIDER_SITE_OTHER): Payer: PPO | Admitting: Family Medicine

## 2023-01-05 ENCOUNTER — Other Ambulatory Visit (HOSPITAL_COMMUNITY): Payer: Self-pay

## 2023-01-05 ENCOUNTER — Encounter: Payer: Self-pay | Admitting: Family Medicine

## 2023-01-05 ENCOUNTER — Ambulatory Visit
Admission: RE | Admit: 2023-01-05 | Discharge: 2023-01-05 | Disposition: A | Discharge status: Home / Self Care | Payer: PPO | Source: Ambulatory Visit | Attending: Family Medicine

## 2023-01-05 VITALS — BP 124/88 | HR 90 | Ht 68.0 in | Wt 156.8 lb

## 2023-01-05 DIAGNOSIS — R051 Acute cough: Secondary | ICD-10-CM | POA: Diagnosis not present

## 2023-01-05 DIAGNOSIS — R059 Cough, unspecified: Secondary | ICD-10-CM | POA: Diagnosis not present

## 2023-01-05 MED ORDER — BENZONATATE 200 MG PO CAPS
200.0000 mg | ORAL_CAPSULE | Freq: Two times a day (BID) | ORAL | 0 refills | Status: DC | PRN
  Filled 2023-01-05: qty 20, 10d supply, fill #0

## 2023-01-05 NOTE — Patient Instructions (Addendum)
It was nice to see you today,  We addressed the following topics today: -I will send in an order for an x-ray of your chest.  You can get this done today at [facility] imaging during their business hours - If there is anything concerning for bacterial pneumonia on your chest x-ray we can send in antibiotics - For symptomatic relief of viral sinusitis you can use Afrin nasal decongestant, saline nasal spray, - For cough you can continue to use extra strength Mucinex twice a day, I have also prescribed Tessalon Perles to use twice a day as needed. - If you do not have any improvement in your nasal symptoms in 1 week let us know and then we can discuss antibiotics if we have not already prescribed for pneumonia.  Have a great day,  Frederic Jericho, MD

## 2023-01-05 NOTE — Progress Notes (Unsigned)
Acute Office Visit  Subjective:     Patient ID: George [name], male    DOB: [birth year], 72 y.o.   MRN: [id number]  Chief Complaint  Patient presents with   Nasal Congestion   Cough    HPI Patient is in today for viral URI symptoms.    Patient states he has been experiencing a "cold" for the past 11 days.  His wife was sick first and he became sick approximately 5 days later.  States initially his symptoms were worse for the first 3 days.  Now his symptoms are mostly nasal congestion and cough/chest congestion.  Has taken his temperature routinely without fever.  Mostly if the symptoms are late at night and early in the morning.  Denies nausea or vomiting fever chills, headache.  Patient has a history of CLL.  Follows with the oncologist but has never been treated for it.  States he has had it for 16 years.  Patient currently taking Mucinex and Sudafed as well as a nasal spray with saline and grapeseed extract.  We discussed viral infections versus bacterial.  Discussed how antibiotics would not help with a viral infection.  Patient agreeable to getting a chest x-ray due to his cough to help rule out bacterial pneumonia.  Discussed with patient's the reasons why his nasal congestion is more likely viral than bacterial.  ROS      Objective:    BP 124/88   Pulse 90   Ht 5\' 8"  (1.727 m)   Wt 156 lb 12.8 oz (71.1 kg)   SpO2 95%   BMI 23.84 kg/m  {Vitals History (Optional):23777}  Physical Exam General: Alert, oriented HEENT: Inflamed nasal turbinates, mostly noted on the left side. Pulmonary: Mild left-sided inspiratory rales noted  No results found for any visits on 01/05/23.      Assessment & Plan:   Acute cough Assessment & Plan: Rhinorrhea, nasal congestion, chest congestion is most likely consistent with viral URI.  Patient was requesting antibiotics but we discussed how antibiotics are not helpful for viral infections.  Will get chest x-ray to help rule out  bacterial pneumonia, did appreciate mostly left-sided and mild rales.  No respiratory distress no hypoxia.  No fever. - Continue guaifenesin 600 mg twice daily - Tessalon Perles prescribed - Recommended Afrin nasal decongestion instead of Sudafed oral decongestant. - Follow-up in 1 week if nasal congestion symptoms not improving.  Could consider back to rule sinusitis at that time. - Follow-up chest x-ray.  Orders: -     DG Chest 2 View; Future  Other orders -     Benzonatate; Take 1 capsule (200 mg total) by mouth 2 (two) times daily as needed for cough.  Dispense: 20 capsule; Refill: 0     Return if symptoms worsen or fail to improve.  Sandre Kitty, MD

## 2023-01-05 NOTE — Assessment & Plan Note (Signed)
Rhinorrhea, nasal congestion, chest congestion is most likely consistent with viral URI.  Patient was requesting antibiotics but we discussed how antibiotics are not helpful for viral infections.  Will get chest x-ray to help rule out bacterial pneumonia, did appreciate mostly left-sided and mild rales.  No respiratory distress no hypoxia.  No fever. - Continue guaifenesin 600 mg twice daily - Tessalon Perles prescribed - Recommended Afrin nasal decongestion instead of Sudafed oral decongestant. - Follow-up in 1 week if nasal congestion symptoms not improving.  Could consider back to rule sinusitis at that time. - Follow-up chest x-ray.

## 2023-01-06 ENCOUNTER — Other Ambulatory Visit (HOSPITAL_COMMUNITY): Payer: Self-pay

## 2023-01-06 ENCOUNTER — Other Ambulatory Visit: Payer: Self-pay | Admitting: Family Medicine

## 2023-01-06 MED ORDER — AMOXICILLIN-POT CLAVULANATE 875-125 MG PO TABS
1.0000 | ORAL_TABLET | Freq: Two times a day (BID) | ORAL | 0 refills | Status: AC
  Filled 2023-01-06: qty 14, 7d supply, fill #0

## 2023-01-26 ENCOUNTER — Other Ambulatory Visit: Payer: Self-pay

## 2023-01-26 DIAGNOSIS — I10 Essential (primary) hypertension: Secondary | ICD-10-CM

## 2023-01-26 DIAGNOSIS — E782 Mixed hyperlipidemia: Secondary | ICD-10-CM

## 2023-02-03 ENCOUNTER — Other Ambulatory Visit: Payer: PPO

## 2023-02-03 DIAGNOSIS — E782 Mixed hyperlipidemia: Secondary | ICD-10-CM

## 2023-02-03 DIAGNOSIS — I10 Essential (primary) hypertension: Secondary | ICD-10-CM

## 2023-02-04 ENCOUNTER — Other Ambulatory Visit (HOSPITAL_COMMUNITY): Payer: Self-pay

## 2023-02-04 LAB — COMPREHENSIVE METABOLIC PANEL
ALT: 21 [IU]/L (ref 0–44)
AST: 37 [IU]/L (ref 0–40)
Albumin: 4.5 g/dL (ref 3.8–4.8)
Alkaline Phosphatase: 94 [IU]/L (ref 44–121)
BUN/Creatinine Ratio: 10 (ref 10–24)
BUN: 7 mg/dL — ABNORMAL LOW (ref 8–27)
Bilirubin Total: 0.7 mg/dL (ref 0.0–1.2)
CO2: 22 mmol/L (ref 20–29)
Calcium: 9.4 mg/dL (ref 8.6–10.2)
Chloride: 94 mmol/L — ABNORMAL LOW (ref 96–106)
Creatinine, Ser: 0.69 mg/dL — ABNORMAL LOW (ref 0.76–1.27)
Globulin, Total: 1.9 g/dL (ref 1.5–4.5)
Glucose: 73 mg/dL (ref 70–99)
Potassium: 4.4 mmol/L (ref 3.5–5.2)
Sodium: 131 mmol/L — ABNORMAL LOW (ref 134–144)
Total Protein: 6.4 g/dL (ref 6.0–8.5)
eGFR: 99 mL/min/{1.73_m2} (ref >59)

## 2023-02-04 LAB — CBC WITH DIFFERENTIAL/PLATELET
Basophils Absolute: 0.1 10*3/uL (ref 0.0–0.2)
Basos: 0 %
EOS (ABSOLUTE): 0.1 10*3/uL (ref 0.0–0.4)
Eos: 0 %
Hematocrit: 36.1 % — ABNORMAL LOW (ref 37.5–51.0)
Hemoglobin: 11.7 g/dL — ABNORMAL LOW (ref 13.0–17.7)
Immature Grans (Abs): 0 10*3/uL (ref 0.0–0.1)
Immature Granulocytes: 0 %
Lymphocytes Absolute: 105.4 10*3/uL — ABNORMAL HIGH (ref 0.7–3.1)
Lymphs: 90 %
MCH: 33 pg (ref 26.6–33.0)
MCHC: 32.4 g/dL (ref 31.5–35.7)
MCV: 102 fL — ABNORMAL HIGH (ref 79–97)
Monocytes Absolute: 7.6 10*3/uL — ABNORMAL HIGH (ref 0.1–0.9)
Monocytes: 6 %
Neutrophils Absolute: 4.2 10*3/uL (ref 1.4–7.0)
Neutrophils: 4 %
Platelets: 148 10*3/uL — ABNORMAL LOW (ref 150–450)
RBC: 3.55 x10E6/uL — ABNORMAL LOW (ref 4.14–5.80)
RDW: 12.4 % (ref 11.6–15.4)
WBC: 117.5 10*3/uL (ref 3.4–10.8)

## 2023-02-04 LAB — HEMOGLOBIN A1C
Est. average glucose Bld gHb Est-mCnc: 105 mg/dL
Hgb A1c MFr Bld: 5.3 % (ref 4.8–5.6)

## 2023-02-04 LAB — LIPID PANEL
Chol/HDL Ratio: 2.8 ratio (ref 0.0–5.0)
Cholesterol, Total: 165 mg/dL (ref 100–199)
HDL: 59 mg/dL (ref >39)
LDL Chol Calc (NIH): 94 mg/dL (ref 0–99)
Triglycerides: 60 mg/dL (ref 0–149)
VLDL Cholesterol Cal: 12 mg/dL (ref 5–40)

## 2023-02-04 LAB — TSH: TSH: 2.28 u[IU]/mL (ref 0.450–4.500)

## 2023-02-05 ENCOUNTER — Inpatient Hospital Stay: Payer: PPO | Attending: Hematology & Oncology | Admitting: Hematology & Oncology

## 2023-02-05 ENCOUNTER — Encounter: Payer: Self-pay | Admitting: Hematology & Oncology

## 2023-02-05 VITALS — BP 136/87 | HR 73 | Temp 98.0°F | Resp 20 | Ht 68.5 in | Wt 156.0 lb

## 2023-02-05 DIAGNOSIS — D649 Anemia, unspecified: Secondary | ICD-10-CM | POA: Diagnosis not present

## 2023-02-05 DIAGNOSIS — C9110 Chronic lymphocytic leukemia of B-cell type not having achieved remission: Secondary | ICD-10-CM | POA: Diagnosis not present

## 2023-02-05 NOTE — Progress Notes (Signed)
Hematology and Oncology Follow Up Visit  George [name] [id number] 09-12-1950 72 y.o. 02/05/2023   Principle Diagnosis:  CLL-stage A  Current Therapy:   Observation     Interim History:  [name] is back for an early visit.  I saw some lab work on him a couple days ago.  His white cell count was up to 117,000.  This clearly is indicative of his CLL.  He says that he had a cold recently.  He was wondering if this might cause the white cell count to go up.  Is a little more anemic.  Again we will have to watch this.  His platelet count is also down a little bit.  He has had no fever.  He has had no bleeding.  He said no bruising.  He has had no headache.  There has been no leg swelling.  He has had no rashes.  Overall, I would have to say that his performance status is ECOG 0.    Medications:  Current Outpatient Medications:    cholecalciferol (VITAMIN D) 1000 UNITS tablet, Take 1,000 Units by mouth daily. 2 tablets daily, Disp: , Rfl:    Coenzyme Q10 (CO Q 10 PO), Take by mouth every morning. , Disp: , Rfl:    esomeprazole (NEXIUM) 20 MG capsule, Take 1 capsule (20 mg total) by mouth daily., Disp: 90 capsule, Rfl: 1   fluticasone (FLONASE) 50 MCG/ACT nasal spray, 1 spray each nostril following sinus rinses twice daily, Disp: 16 g, Rfl: 2   GLUCOSAMINE HCL PO, Take by mouth every morning. , Disp: , Rfl:    losartan (COZAAR) 50 MG tablet, Take 1 tablet (50 mg total) by mouth daily., Disp: 90 tablet, Rfl: 1   MAGNESIUM PO, Take 400 mg by mouth., Disp: , Rfl:    NIACIN CR PO, Take 500 mg by mouth daily. , Disp: , Rfl:    Omega-3 Fatty Acids (FISH OIL CONCENTRATE PO), Take by mouth every morning. , Disp: , Rfl:    Probiotic Product (PROBIOTIC PO), Take by mouth daily., Disp: , Rfl:    psyllium (METAMUCIL) 58.6 % powder, Take 1 packet by mouth 2 (two) times daily., Disp: , Rfl:    rosuvastatin (CRESTOR) 40 MG tablet, Take 1 tablet (40 mg total) by mouth daily., Disp: 90 tablet,  Rfl: 1   Zinc 30 MG TABS, Take by mouth every morning., Disp: , Rfl:    benzonatate (TESSALON) 200 MG capsule, Take 1 capsule (200 mg total) by mouth 2 (two) times daily as needed for cough. (Patient not taking: Reported on 02/05/2023), Disp: 20 capsule, Rfl: 0   fluorouracil (EFUDEX) 5 % cream, Apply to affected area once a day 1 hour before bedtime (Patient not taking: Reported on 08/25/2022), Disp: 40 g, Rfl: 0   fluorouracil (EFUDEX) 5 % cream, Apply to the affected area(s) once a day for 2 weeks as directed (Patient not taking: Reported on 02/05/2023), Disp: 40 g, Rfl: 0   fluorouracil (EFUDEX) 5 % cream, Apply 1 Application topically daily for 14 days (Patient not taking: Reported on 02/05/2023), Disp: 40 g, Rfl: 0   mupirocin ointment (BACTROBAN) 2 %, Apply topically twice daily (Patient not taking: Reported on 02/05/2023), Disp: 22 g, Rfl: 12  Allergies:  Allergies  Allergen Reactions   Doxycycline Other (See Comments)    Sore throat, red tongue, red skin  Patient states that he was also on a camping trip when the symptoms developed, so he is unsure  if they were due to the doxycycline or something that he encountered the mountains.    Past Medical History, Surgical history, Social history, and Family History were reviewed and updated.  Review of Systems: Review of Systems  Constitutional: Negative.   HENT: Negative.    Eyes: Negative.   Respiratory: Negative.    Cardiovascular: Negative.   Gastrointestinal: Negative.   Genitourinary: Negative.   Musculoskeletal: Negative.   Skin: Negative.   Neurological: Negative.   Endo/Heme/Allergies: Negative.   Psychiatric/Behavioral: Negative.       Physical Exam:  height is 5' 8.5" (1.74 m) and weight is 156 lb (70.8 kg). His oral temperature is 98 F (36.7 C). His blood pressure is 136/87 and his pulse is 73. His respiration is 20 and oxygen saturation is 100%.   Physical Exam Vitals reviewed.  Constitutional:      Comments:  In the right axilla, he does have a small lymph node.  It measures about 5 mm.  It is mobile and nontender.  In the left axilla, there is a large lymph node that measures about 1-1.5 cm.  It is mobile and nontender.  HENT:     Head: Normocephalic and atraumatic.  Eyes:     Pupils: Pupils are equal, round, and reactive to light.  Cardiovascular:     Rate and Rhythm: Normal rate and regular rhythm.     Heart sounds: Normal heart sounds.  Pulmonary:     Effort: Pulmonary effort is normal.     Breath sounds: Normal breath sounds.  Abdominal:     General: Bowel sounds are normal.     Palpations: Abdomen is soft.  Musculoskeletal:        General: No tenderness or deformity. Normal range of motion.     Cervical back: Normal range of motion.  Lymphadenopathy:     Cervical: No cervical adenopathy.  Skin:    General: Skin is warm and dry.     Findings: No erythema or rash.  Neurological:     Mental Status: He is alert and oriented to person, place, and time.  Psychiatric:        Behavior: Behavior normal.        Thought Content: Thought content normal.        Judgment: Judgment normal.      Lab Results  Component Value Date   WBC 117.5 (HH) 02/03/2023   HGB 11.7 (L) 02/03/2023   HCT 36.1 (L) 02/03/2023   MCV 102 (H) 02/03/2023   PLT 148 (L) 02/03/2023     Chemistry      Component Value Date/Time   NA 131 (L) 02/03/2023 0900   K 4.4 02/03/2023 0900   CL 94 (L) 02/03/2023 0900   CO2 22 02/03/2023 0900   BUN 7 (L) 02/03/2023 0900   CREATININE 0.69 (L) 02/03/2023 0900   CREATININE 0.78 08/25/2022 0927   CREATININE 0.73 10/15/2015 0909   GLU 77 02/08/2022 0000      Component Value Date/Time   CALCIUM 9.4 02/03/2023 0900   ALKPHOS 94 02/03/2023 0900   AST 37 02/03/2023 0900   AST 25 08/25/2022 0927   ALT 21 02/03/2023 0900   ALT 17 08/25/2022 0927   BILITOT 0.7 02/03/2023 0900   BILITOT 0.4 08/25/2022 4401      Impression and Plan: George [name] is a 72 year old  gentleman. He has CLL.  I would have said that his disease prior is a little bit more active given the elevated white cell count.  Again, I cannot find anything on exam that would make me want to treat him.  He is asymptomatic right now.  However, I suspect that we will have to treat him within the year.  We we will see how his platelet count trends.  This will be important.  He has a regular appointment scheduled with me for December.  I will keep this appointment.  We will see how his white cell count looks at that time.     Josph Macho, MD 11/14/20249:21 AM

## 2023-02-10 ENCOUNTER — Encounter: Payer: Self-pay | Admitting: Family Medicine

## 2023-02-10 ENCOUNTER — Ambulatory Visit (INDEPENDENT_AMBULATORY_CARE_PROVIDER_SITE_OTHER): Payer: PPO | Admitting: Family Medicine

## 2023-02-10 ENCOUNTER — Other Ambulatory Visit (HOSPITAL_COMMUNITY): Payer: Self-pay

## 2023-02-10 VITALS — BP 117/77 | HR 85 | Resp 18 | Ht 68.5 in | Wt 161.0 lb

## 2023-02-10 DIAGNOSIS — K219 Gastro-esophageal reflux disease without esophagitis: Secondary | ICD-10-CM

## 2023-02-10 DIAGNOSIS — I10 Essential (primary) hypertension: Secondary | ICD-10-CM | POA: Diagnosis not present

## 2023-02-10 DIAGNOSIS — E782 Mixed hyperlipidemia: Secondary | ICD-10-CM

## 2023-02-10 MED ORDER — LOSARTAN POTASSIUM 50 MG PO TABS
50.0000 mg | ORAL_TABLET | Freq: Every day | ORAL | 1 refills | Status: DC
  Filled 2023-02-10 – 2023-05-11 (×4): qty 90, 90d supply, fill #0
  Filled 2023-07-28: qty 90, 90d supply, fill #1

## 2023-02-10 MED ORDER — ROSUVASTATIN CALCIUM 40 MG PO TABS
40.0000 mg | ORAL_TABLET | Freq: Every day | ORAL | 1 refills | Status: DC
  Filled 2023-02-10 – 2023-04-02 (×2): qty 90, 90d supply, fill #0
  Filled 2023-07-07 – 2023-07-09 (×2): qty 90, 90d supply, fill #1

## 2023-02-10 MED ORDER — ESOMEPRAZOLE MAGNESIUM 20 MG PO CPDR
20.0000 mg | DELAYED_RELEASE_CAPSULE | Freq: Every day | ORAL | 1 refills | Status: DC
  Filled 2023-02-10 – 2023-04-02 (×2): qty 90, 90d supply, fill #0
  Filled 2023-07-07 – 2023-07-09 (×2): qty 90, 90d supply, fill #1

## 2023-02-10 NOTE — Assessment & Plan Note (Signed)
Stable.  Continue esomeprazole 20 mg every other day as tolerated, may take every day if needed.  Encouraged to pay attention and keep a symptom log of foods, activities, at time of day related to ongoing cough.  Also recommended trial of nondrowsy allergy medication to differentiate between acid reflux versus allergies causing cough.

## 2023-02-10 NOTE — Assessment & Plan Note (Signed)
Last lipid panel: LDL 94, HDL 59, triglycerides 60.  Continue rosuvastatin 40 mg daily and low-fat diet.  Hepatic function within normal limits.  Will continue to monitor.

## 2023-02-10 NOTE — Assessment & Plan Note (Signed)
BP goal <130/80.  Stable, at goal.  Continue losartan 50 mg daily.  CMP showed mild hyponatremia and hypochloremia, contacting Dr. Myna Hidalgo to verify recommended fluid intake each day.  Sodium level though low is stable.  Will continue to monitor.

## 2023-02-10 NOTE — Progress Notes (Signed)
Established Patient Office Visit  Subjective   Patient ID: George [name], male    DOB: 1951/02/28  Age: 72 y.o. MRN: [id number]  Chief Complaint  Patient presents with   Hypertension    HPI George [name] is a 72 y.o. male presenting today for follow up of hypertension, hyperlipidemia, GERD.  He notes that after most recent CBC results were forwarded to oncologist, he had an early appointment with Dr. Myna Hidalgo the next day.  No significant findings on physical exam.  Denies fever, bleeding, bruising, headache, leg edema, rashes.  He had a URI shortly before labs were drawn and took amoxicillin.  Dr. Myna Hidalgo believes this likely contributed to elevated WBC.  Patient also notes that as instructed by oncology, he drinks a lot of water during the day and was made aware that this can cause lower sodium values. Hypertension:  Pt denies chest pain, SOB, dizziness, edema, syncope, fatigue or heart palpitations. Taking losartan, reports excellent compliance with treatment. Denies side effects. Hyperlipidemia: tolerating rosuvastatin well with no myalgias or significant side effects.  He lives a very active lifestyle. The 10-year ASCVD risk score (Arnett DK, et al., 2019) is: 16.5% GERD: Patient currently taking esomeprazole, is not having side effects. he reports excellent compliance with treatment. Denies heartburn, dysphagia, nausea/vomiting, diarrhea, constipation, abdominal pain, chest pain, early satiety, hematochezia/melena.  He does endorse ongoing cough on some afternoons.  He has not identified any particular triggers and is wondering if it may be related to allergies vs acid reflux.  Outpatient Medications Prior to Visit  Medication Sig   cholecalciferol (VITAMIN D) 1000 UNITS tablet Take 1,000 Units by mouth daily. 2 tablets daily   Coenzyme Q10 (CO Q 10 PO) Take by mouth every morning.    fluticasone (FLONASE) 50 MCG/ACT nasal spray 1 spray each nostril following sinus rinses twice daily    GLUCOSAMINE HCL PO Take by mouth every morning.    MAGNESIUM PO Take 400 mg by mouth.   NIACIN CR PO Take 500 mg by mouth daily.    Omega-3 Fatty Acids (FISH OIL CONCENTRATE PO) Take by mouth every morning.    Probiotic Product (PROBIOTIC PO) Take by mouth daily.   psyllium (METAMUCIL) 58.6 % powder Take 1 packet by mouth 2 (two) times daily.   Zinc 30 MG TABS Take by mouth every morning.   [DISCONTINUED] esomeprazole (NEXIUM) 20 MG capsule Take 1 capsule (20 mg total) by mouth daily.   [DISCONTINUED] losartan (COZAAR) 50 MG tablet Take 1 tablet (50 mg total) by mouth daily.   [DISCONTINUED] rosuvastatin (CRESTOR) 40 MG tablet Take 1 tablet (40 mg total) by mouth daily.   [DISCONTINUED] benzonatate (TESSALON) 200 MG capsule Take 1 capsule (200 mg total) by mouth 2 (two) times daily as needed for cough. (Patient not taking: Reported on 02/05/2023)   [DISCONTINUED] fluorouracil (EFUDEX) 5 % cream Apply to affected area once a day 1 hour before bedtime (Patient not taking: Reported on 08/25/2022)   [DISCONTINUED] fluorouracil (EFUDEX) 5 % cream Apply to the affected area(s) once a day for 2 weeks as directed (Patient not taking: Reported on 02/05/2023)   [DISCONTINUED] fluorouracil (EFUDEX) 5 % cream Apply 1 Application topically daily for 14 days (Patient not taking: Reported on 02/05/2023)   [DISCONTINUED] mupirocin ointment (BACTROBAN) 2 % Apply topically twice daily (Patient not taking: Reported on 02/05/2023)   No facility-administered medications prior to visit.    ROS Negative unless otherwise noted in HPI   Objective:  BP 117/77 (BP Location: Left Arm, Patient Position: Sitting, Cuff Size: Normal)   Pulse 85   Resp 18   Ht 5' 8.5" (1.74 m)   Wt 161 lb (73 kg)   SpO2 97%   BMI 24.12 kg/m   Physical Exam Constitutional:      General: He is not in acute distress.    Appearance: Normal appearance.  HENT:     Head: Normocephalic and atraumatic.  Cardiovascular:     Rate  and Rhythm: Normal rate and regular rhythm.     Heart sounds: Normal heart sounds. No murmur heard.    No friction rub. No gallop.  Pulmonary:     Effort: Pulmonary effort is normal. No respiratory distress.     Breath sounds: Normal breath sounds. No wheezing, rhonchi or rales.  Skin:    General: Skin is warm and dry.  Neurological:     Mental Status: He is alert and oriented to person, place, and time.  Psychiatric:        Mood and Affect: Mood normal.     Assessment & Plan:  Essential hypertension Assessment & Plan: BP goal <130/80.  Stable, at goal.  Continue losartan 50 mg daily.  CMP showed mild hyponatremia and hypochloremia, contacting Dr. Myna Hidalgo to verify recommended fluid intake each day.  Sodium level though low is stable.  Will continue to monitor.  Orders: -     Losartan Potassium; Take 1 tablet (50 mg total) by mouth daily.  Dispense: 90 tablet; Refill: 1  Hyperlipidemia, mixed Assessment & Plan: Last lipid panel: LDL 94, HDL 59, triglycerides 60.  Continue rosuvastatin 40 mg daily and low-fat diet.  Hepatic function within normal limits.  Will continue to monitor.  Orders: -     Rosuvastatin Calcium; Take 1 tablet (40 mg total) by mouth daily.  Dispense: 90 tablet; Refill: 1  Gastroesophageal reflux disease, unspecified whether esophagitis present Assessment & Plan: Stable.  Continue esomeprazole 20 mg every other day as tolerated, may take every day if needed.  Encouraged to pay attention and keep a symptom log of foods, activities, at time of day related to ongoing cough.  Also recommended trial of nondrowsy allergy medication to differentiate between acid reflux versus allergies causing cough.  Orders: -     Esomeprazole Magnesium; Take 1 capsule (20 mg total) by mouth daily.  Dispense: 90 capsule; Refill: 1  Discussed hyponatremia and hypochloremia on recent lab work, contacting Dr. Myna Hidalgo to confirm the recommended daily fluid intake.  Will continue to  monitor.  Instructed patient to alert PCP and Dr. Myna Hidalgo if he does start developing any symptoms.  Patient verbalized understanding and is agreeable to this plan.  Due for colonoscopy, encourage patient to contact gastroenterology and let me know if he does need a referral.  Discussed recommendation for PCV 20 to complete pneumonia vaccine series.  Patient would like to wait for right now but is aware that he can have this completed at primary care office or the pharmacy.  Return in about 6 months (around 08/10/2023) for annual physical, fasting blood work 1 week before.    Melida Quitter, PA

## 2023-02-10 NOTE — Patient Instructions (Signed)
If you start developing any symptoms or feeling poorly, please let myself and Dr. Myna Hidalgo know.  Try a nondrowsy allergy medicine like Zyrtec, Claritin, or Allegra to see if it helps with your cough.  You are due for your colonoscopy, so I recommend getting in touch with your gastroenterologist to schedule.  You are due for your second pneumonia vaccine, called PCV 20.  You can have this done either at our office or at the pharmacy.

## 2023-02-24 ENCOUNTER — Ambulatory Visit: Payer: PPO | Admitting: Hematology & Oncology

## 2023-02-24 ENCOUNTER — Inpatient Hospital Stay: Payer: PPO

## 2023-02-27 DIAGNOSIS — H02834 Dermatochalasis of left upper eyelid: Secondary | ICD-10-CM | POA: Diagnosis not present

## 2023-02-27 DIAGNOSIS — H524 Presbyopia: Secondary | ICD-10-CM | POA: Diagnosis not present

## 2023-02-27 DIAGNOSIS — H02831 Dermatochalasis of right upper eyelid: Secondary | ICD-10-CM | POA: Diagnosis not present

## 2023-02-27 DIAGNOSIS — H35362 Drusen (degenerative) of macula, left eye: Secondary | ICD-10-CM | POA: Diagnosis not present

## 2023-02-27 DIAGNOSIS — Z961 Presence of intraocular lens: Secondary | ICD-10-CM | POA: Diagnosis not present

## 2023-02-27 DIAGNOSIS — H25812 Combined forms of age-related cataract, left eye: Secondary | ICD-10-CM | POA: Diagnosis not present

## 2023-02-27 LAB — HM DIABETES EYE EXAM

## 2023-03-02 ENCOUNTER — Inpatient Hospital Stay: Payer: PPO | Attending: Hematology & Oncology

## 2023-03-02 ENCOUNTER — Telehealth: Payer: Self-pay | Admitting: *Deleted

## 2023-03-02 ENCOUNTER — Other Ambulatory Visit: Payer: Self-pay

## 2023-03-02 ENCOUNTER — Encounter: Payer: Self-pay | Admitting: Hematology & Oncology

## 2023-03-02 ENCOUNTER — Inpatient Hospital Stay (HOSPITAL_BASED_OUTPATIENT_CLINIC_OR_DEPARTMENT_OTHER): Payer: PPO | Admitting: Hematology & Oncology

## 2023-03-02 VITALS — BP 148/88 | HR 73 | Temp 98.0°F | Resp 18 | Ht 68.5 in

## 2023-03-02 DIAGNOSIS — C9110 Chronic lymphocytic leukemia of B-cell type not having achieved remission: Secondary | ICD-10-CM | POA: Diagnosis not present

## 2023-03-02 LAB — CBC WITH DIFFERENTIAL (CANCER CENTER ONLY)
Abs Immature Granulocytes: 0.23 10*3/uL — ABNORMAL HIGH (ref 0.00–0.07)
Basophils Absolute: 0 10*3/uL (ref 0.0–0.1)
Basophils Relative: 0 %
Eosinophils Absolute: 0.1 10*3/uL (ref 0.0–0.5)
Eosinophils Relative: 0 %
HCT: 36 % — ABNORMAL LOW (ref 39.0–52.0)
Hemoglobin: 11.8 g/dL — ABNORMAL LOW (ref 13.0–17.0)
Immature Granulocytes: 0 %
Lymphocytes Relative: 93 %
Lymphs Abs: 120.4 10*3/uL — ABNORMAL HIGH (ref 0.7–4.0)
MCH: 32.7 pg (ref 26.0–34.0)
MCHC: 32.8 g/dL (ref 30.0–36.0)
MCV: 99.7 fL (ref 80.0–100.0)
Monocytes Absolute: 4.9 10*3/uL — ABNORMAL HIGH (ref 0.1–1.0)
Monocytes Relative: 4 %
Neutro Abs: 3.5 10*3/uL (ref 1.7–7.7)
Neutrophils Relative %: 3 %
Platelet Count: 184 10*3/uL (ref 150–400)
RBC: 3.61 MIL/uL — ABNORMAL LOW (ref 4.22–5.81)
RDW: 13.4 % (ref 11.5–15.5)
Smear Review: NORMAL
WBC Count: 129.2 10*3/uL (ref 4.0–10.5)
nRBC: 0 % (ref 0.0–0.2)

## 2023-03-02 LAB — CMP (CANCER CENTER ONLY)
ALT: 22 U/L (ref 0–44)
AST: 37 U/L (ref 15–41)
Albumin: 4.6 g/dL (ref 3.5–5.0)
Alkaline Phosphatase: 91 U/L (ref 38–126)
Anion gap: 9 (ref 5–15)
BUN: 7 mg/dL — ABNORMAL LOW (ref 8–23)
CO2: 27 mmol/L (ref 22–32)
Calcium: 9.4 mg/dL (ref 8.9–10.3)
Chloride: 93 mmol/L — ABNORMAL LOW (ref 98–111)
Creatinine: 0.74 mg/dL (ref 0.61–1.24)
GFR, Estimated: >60 mL/min (ref >60)
Glucose, Bld: 102 mg/dL — ABNORMAL HIGH (ref 70–99)
Potassium: 4.2 mmol/L (ref 3.5–5.1)
Sodium: 129 mmol/L — ABNORMAL LOW (ref 135–145)
Total Bilirubin: 0.7 mg/dL (ref <1.2)
Total Protein: 7.1 g/dL (ref 6.5–8.1)

## 2023-03-02 LAB — RETICULOCYTES
Immature Retic Fract: 13.2 % (ref 2.3–15.9)
RBC.: 3.58 MIL/uL — ABNORMAL LOW (ref 4.22–5.81)
Retic Count, Absolute: 46.2 10*3/uL (ref 19.0–186.0)
Retic Ct Pct: 1.3 % (ref 0.4–3.1)

## 2023-03-02 LAB — LACTATE DEHYDROGENASE: LDH: 82 U/L — ABNORMAL LOW (ref 98–192)

## 2023-03-02 LAB — SAVE SMEAR(SSMR), FOR PROVIDER SLIDE REVIEW

## 2023-03-02 NOTE — Telephone Encounter (Signed)
Gery Pray from lab brought a panic WBC count of 129.17 to my attention. Results given to MD.

## 2023-03-02 NOTE — Progress Notes (Signed)
Hematology and Oncology Follow Up Visit  George [name] [id number] [birth year] 72 y.o. 03/02/2023   Principle Diagnosis:  CLL-stage A  Current Therapy:   Observation     Interim History:  Mr.  Sacco is back for follow-up.  He is doing okay.  We last saw him back in November.  Had a nice Thanksgiving.  He is in a very nice Christmas.  He really has had no complaints.  He has had no fever.  He has had no swollen lymph nodes.  He has had no weight loss.  He has had no bleeding.  There is been no bruising.  He has had no abdominal pain.  There is been no change in bowel or bladder habits.  He has had no leg swelling.  Overall, I would have to say that his performance status is probably ECOG 0.   Medications:  Current Outpatient Medications:    cholecalciferol (VITAMIN D) 1000 UNITS tablet, Take 1,000 Units by mouth daily. 2 tablets daily, Disp: , Rfl:    Coenzyme Q10 (CO Q 10 PO), Take by mouth every morning. , Disp: , Rfl:    esomeprazole (NEXIUM) 20 MG capsule, Take 1 capsule (20 mg total) by mouth daily., Disp: 90 capsule, Rfl: 1   fluticasone (FLONASE) 50 MCG/ACT nasal spray, 1 spray each nostril following sinus rinses twice daily, Disp: 16 g, Rfl: 2   GLUCOSAMINE HCL PO, Take by mouth every morning. , Disp: , Rfl:    losartan (COZAAR) 50 MG tablet, Take 1 tablet (50 mg total) by mouth daily., Disp: 90 tablet, Rfl: 1   MAGNESIUM PO, Take 400 mg by mouth., Disp: , Rfl:    NIACIN CR PO, Take 500 mg by mouth daily. , Disp: , Rfl:    Omega-3 Fatty Acids (FISH OIL CONCENTRATE PO), Take by mouth every morning. , Disp: , Rfl:    Probiotic Product (PROBIOTIC PO), Take by mouth daily., Disp: , Rfl:    psyllium (METAMUCIL) 58.6 % powder, Take 1 packet by mouth 2 (two) times daily., Disp: , Rfl:    rosuvastatin (CRESTOR) 40 MG tablet, Take 1 tablet (40 mg total) by mouth daily., Disp: 90 tablet, Rfl: 1   Zinc 30 MG TABS, Take by mouth every morning., Disp: , Rfl:   Allergies:  Allergies   Allergen Reactions   Doxycycline Other (See Comments)    Sore throat, red tongue, red skin  Patient states that he was also on a camping trip when the symptoms developed, so he is unsure if they were due to the doxycycline or something that he encountered the mountains.    Past Medical History, Surgical history, Social history, and Family History were reviewed and updated.  Review of Systems: Review of Systems  Constitutional: Negative.   HENT: Negative.    Eyes: Negative.   Respiratory: Negative.    Cardiovascular: Negative.   Gastrointestinal: Negative.   Genitourinary: Negative.   Musculoskeletal: Negative.   Skin: Negative.   Neurological: Negative.   Endo/Heme/Allergies: Negative.   Psychiatric/Behavioral: Negative.       Physical Exam:  height is 5' 8.5" (1.74 m). His oral temperature is 98 F (36.7 C). His blood pressure is 148/88 (abnormal) and his pulse is 73. His respiration is 18 and oxygen saturation is 99%.   Physical Exam Vitals reviewed.  Constitutional:      Comments: In the right axilla, he does have a small lymph node.  It measures about 5 mm.  It is mobile and  nontender.  In the left axilla, I really cannot palpate any adenopathy.     HENT:     Head: Normocephalic and atraumatic.  Eyes:     Pupils: Pupils are equal, round, and reactive to light.  Cardiovascular:     Rate and Rhythm: Normal rate and regular rhythm.     Heart sounds: Normal heart sounds.  Pulmonary:     Effort: Pulmonary effort is normal.     Breath sounds: Normal breath sounds.  Abdominal:     General: Bowel sounds are normal.     Palpations: Abdomen is soft.  Musculoskeletal:        General: No tenderness or deformity. Normal range of motion.     Cervical back: Normal range of motion.  Lymphadenopathy:     Cervical: No cervical adenopathy.  Skin:    General: Skin is warm and dry.     Findings: No erythema or rash.  Neurological:     Mental Status: He is alert and oriented  to person, place, and time.  Psychiatric:        Behavior: Behavior normal.        Thought Content: Thought content normal.        Judgment: Judgment normal.      Lab Results  Component Value Date   WBC 129.2 (HH) 03/02/2023   HGB 11.8 (L) 03/02/2023   HCT 36.0 (L) 03/02/2023   MCV 99.7 03/02/2023   PLT 184 03/02/2023     Chemistry      Component Value Date/Time   NA 131 (L) 02/03/2023 0900   K 4.4 02/03/2023 0900   CL 94 (L) 02/03/2023 0900   CO2 22 02/03/2023 0900   BUN 7 (L) 02/03/2023 0900   CREATININE 0.69 (L) 02/03/2023 0900   CREATININE 0.78 08/25/2022 0927   CREATININE 0.73 10/15/2015 0909   GLU 77 02/08/2022 0000      Component Value Date/Time   CALCIUM 9.4 02/03/2023 0900   ALKPHOS 94 02/03/2023 0900   AST 37 02/03/2023 0900   AST 25 08/25/2022 0927   ALT 21 02/03/2023 0900   ALT 17 08/25/2022 0927   BILITOT 0.7 02/03/2023 0900   BILITOT 0.4 08/25/2022 1308      Impression and Plan: Mr. Mileto is a 72 year old gentleman. He has CLL.  His white cell count is going up slowly.  Again, he really is not that anemic and his platelet count is going back up which is nice to see.  His exam is relatively unremarkable aside of the small lymph node in the right axilla.  Still think we can follow him along.  I am sure that at some point, in 2025, we probably will have to treat him.  I will consider treating him with either Calquence/Brukinsa with venetoclax.  I would like to see him back in about 2 months.  I think we can get him back in 2 months time.  If there is any issues before then, we will get him back.     Josph Macho, MD 12/9/20248:49 AM

## 2023-03-13 ENCOUNTER — Other Ambulatory Visit: Payer: Self-pay | Admitting: *Deleted

## 2023-04-02 ENCOUNTER — Other Ambulatory Visit (HOSPITAL_COMMUNITY): Payer: Self-pay

## 2023-04-03 ENCOUNTER — Other Ambulatory Visit: Payer: Self-pay

## 2023-04-03 ENCOUNTER — Other Ambulatory Visit (HOSPITAL_COMMUNITY): Payer: Self-pay

## 2023-04-30 ENCOUNTER — Encounter: Payer: Self-pay | Admitting: Family Medicine

## 2023-05-04 ENCOUNTER — Inpatient Hospital Stay (HOSPITAL_BASED_OUTPATIENT_CLINIC_OR_DEPARTMENT_OTHER): Payer: PPO | Admitting: Hematology & Oncology

## 2023-05-04 ENCOUNTER — Encounter: Payer: Self-pay | Admitting: Hematology & Oncology

## 2023-05-04 ENCOUNTER — Inpatient Hospital Stay: Payer: PPO | Attending: Hematology & Oncology

## 2023-05-04 ENCOUNTER — Telehealth: Payer: Self-pay

## 2023-05-04 VITALS — BP 146/90 | HR 72 | Temp 98.3°F | Resp 20 | Ht 68.5 in | Wt 159.1 lb

## 2023-05-04 DIAGNOSIS — C9110 Chronic lymphocytic leukemia of B-cell type not having achieved remission: Secondary | ICD-10-CM

## 2023-05-04 LAB — CMP (CANCER CENTER ONLY)
ALT: 23 U/L (ref 0–44)
AST: 37 U/L (ref 15–41)
Albumin: 4.7 g/dL (ref 3.5–5.0)
Alkaline Phosphatase: 69 U/L (ref 38–126)
Anion gap: 8 (ref 5–15)
BUN: 9 mg/dL (ref 8–23)
CO2: 28 mmol/L (ref 22–32)
Calcium: 9.9 mg/dL (ref 8.9–10.3)
Chloride: 99 mmol/L (ref 98–111)
Creatinine: 0.8 mg/dL (ref 0.61–1.24)
GFR, Estimated: >60 mL/min (ref >60)
Glucose, Bld: 91 mg/dL (ref 70–99)
Potassium: 4.9 mmol/L (ref 3.5–5.1)
Sodium: 135 mmol/L (ref 135–145)
Total Bilirubin: 0.8 mg/dL (ref 0.0–1.2)
Total Protein: 6.8 g/dL (ref 6.5–8.1)

## 2023-05-04 LAB — CBC WITH DIFFERENTIAL (CANCER CENTER ONLY)
Abs Immature Granulocytes: 0.17 10*3/uL — ABNORMAL HIGH (ref 0.00–0.07)
Basophils Absolute: 0.1 10*3/uL (ref 0.0–0.1)
Basophils Relative: 0 %
Eosinophils Absolute: 0 10*3/uL (ref 0.0–0.5)
Eosinophils Relative: 0 %
HCT: 36 % — ABNORMAL LOW (ref 39.0–52.0)
Hemoglobin: 11.5 g/dL — ABNORMAL LOW (ref 13.0–17.0)
Immature Granulocytes: 0 %
Lymphocytes Relative: 92 %
Lymphs Abs: 127.8 10*3/uL — ABNORMAL HIGH (ref 0.7–4.0)
MCH: 33 pg (ref 26.0–34.0)
MCHC: 31.9 g/dL (ref 30.0–36.0)
MCV: 103.4 fL — ABNORMAL HIGH (ref 80.0–100.0)
Monocytes Absolute: 8.7 10*3/uL — ABNORMAL HIGH (ref 0.1–1.0)
Monocytes Relative: 6 %
Neutro Abs: 3.2 10*3/uL (ref 1.7–7.7)
Neutrophils Relative %: 2 %
Platelet Count: 173 10*3/uL (ref 150–400)
RBC: 3.48 MIL/uL — ABNORMAL LOW (ref 4.22–5.81)
RDW: 13.6 % (ref 11.5–15.5)
Smear Review: NORMAL
WBC Count: 140 10*3/uL (ref 4.0–10.5)
nRBC: 0 % (ref 0.0–0.2)

## 2023-05-04 LAB — SAVE SMEAR(SSMR), FOR PROVIDER SLIDE REVIEW

## 2023-05-04 LAB — LACTATE DEHYDROGENASE: LDH: 92 U/L — ABNORMAL LOW (ref 98–192)

## 2023-05-04 LAB — URIC ACID: Uric Acid, Serum: 4.8 mg/dL (ref 3.7–8.6)

## 2023-05-04 NOTE — Telephone Encounter (Signed)
 Dr Maria Shiner aware of critical high WBC 140. Will address during today's office visit. dph

## 2023-05-04 NOTE — Progress Notes (Signed)
 Hematology and Oncology Follow Up Visit  George [name] [id number] 1951/02/15 73 y.o. 05/04/2023   Principle Diagnosis:  CLL-stage A  Current Therapy:   Observation     Interim History:  [name] is back for follow-up.  We saw him before the Christmas holiday.  He is doing quite nicely.  He really has had no complaints since we last saw him.  He is still quite active.  He has not been able to ride his bike because of the weather.  He has had no problems with swollen lymph nodes.  He has had no fever.  He has had no change in bowel or bladder habits.   There is been no rashes.  He has had no bleeding.  He has had no leg swelling.  Overall, I would say that his performance status is probably ECOG 0.    Medications:  Current Outpatient Medications:    cholecalciferol (VITAMIN D ) 1000 UNITS tablet, Take 1,000 Units by mouth daily. 2 tablets daily, Disp: , Rfl:    Coenzyme Q10 (CO Q 10 PO), Take by mouth every morning. , Disp: , Rfl:    esomeprazole  (NEXIUM ) 20 MG capsule, Take 1 capsule (20 mg total) by mouth daily., Disp: 90 capsule, Rfl: 1   GLUCOSAMINE HCL PO, Take by mouth every morning. , Disp: , Rfl:    losartan  (COZAAR ) 50 MG tablet, Take 1 tablet (50 mg total) by mouth daily., Disp: 90 tablet, Rfl: 1   MAGNESIUM  PO, Take 400 mg by mouth., Disp: , Rfl:    NIACIN CR PO, Take 500 mg by mouth daily. , Disp: , Rfl:    Omega-3 Fatty Acids (FISH OIL CONCENTRATE PO), Take by mouth every morning. , Disp: , Rfl:    OVER THE COUNTER MEDICATION, 2 (two) times daily. CLEAR- One spray BID., Disp: , Rfl:    Probiotic Product (PROBIOTIC PO), Take by mouth daily., Disp: , Rfl:    psyllium (METAMUCIL) 58.6 % powder, Take 1 packet by mouth 2 (two) times daily., Disp: , Rfl:    rosuvastatin  (CRESTOR ) 40 MG tablet, Take 1 tablet (40 mg total) by mouth daily., Disp: 90 tablet, Rfl: 1   Zinc 30 MG TABS, Take by mouth every morning., Disp: , Rfl:   Allergies:  Allergies  Allergen Reactions    Doxycycline  Other (See Comments)    Sore throat, red tongue, red skin  Patient states that he was also on a camping trip when the symptoms developed, so he is unsure if they were due to the doxycycline  or something that he encountered the mountains.    Past Medical History, Surgical history, Social history, and Family History were reviewed and updated.  Review of Systems: Review of Systems  Constitutional: Negative.   HENT: Negative.    Eyes: Negative.   Respiratory: Negative.    Cardiovascular: Negative.   Gastrointestinal: Negative.   Genitourinary: Negative.   Musculoskeletal: Negative.   Skin: Negative.   Neurological: Negative.   Endo/Heme/Allergies: Negative.   Psychiatric/Behavioral: Negative.       Physical Exam:  height is 5' 8.5" (1.74 m) and weight is 159 lb 1.9 oz (72.2 kg). His oral temperature is 98.3 F (36.8 C). His blood pressure is 132/87 (pended) and his pulse is 72. His respiration is 20 and oxygen saturation is 100%.   Physical Exam Vitals reviewed.  Constitutional:      Comments: In the right axilla, he does have a small lymph node.  It measures about 5  mm.  It is mobile and nontender.  In the left axilla, I really cannot palpate any adenopathy.     HENT:     Head: Normocephalic and atraumatic.  Eyes:     Pupils: Pupils are equal, round, and reactive to light.  Cardiovascular:     Rate and Rhythm: Normal rate and regular rhythm.     Heart sounds: Normal heart sounds.  Pulmonary:     Effort: Pulmonary effort is normal.     Breath sounds: Normal breath sounds.  Abdominal:     General: Bowel sounds are normal.     Palpations: Abdomen is soft.  Musculoskeletal:        General: No tenderness or deformity. Normal range of motion.     Cervical back: Normal range of motion.  Lymphadenopathy:     Cervical: No cervical adenopathy.  Skin:    General: Skin is warm and dry.     Findings: No erythema or rash.  Neurological:     Mental Status: He is  alert and oriented to person, place, and time.  Psychiatric:        Behavior: Behavior normal.        Thought Content: Thought content normal.        Judgment: Judgment normal.      Lab Results  Component Value Date   WBC 140.0 (HH) 05/04/2023   HGB 11.5 (L) 05/04/2023   HCT 36.0 (L) 05/04/2023   MCV 103.4 (H) 05/04/2023   PLT 173 05/04/2023     Chemistry      Component Value Date/Time   NA 135 05/04/2023 0900   NA 131 (L) 02/03/2023 0900   K 4.9 05/04/2023 0900   CL 99 05/04/2023 0900   CO2 28 05/04/2023 0900   BUN 9 05/04/2023 0900   BUN 7 (L) 02/03/2023 0900   CREATININE 0.80 05/04/2023 0900   CREATININE 0.73 10/15/2015 0909   GLU 77 02/08/2022 0000      Component Value Date/Time   CALCIUM  9.9 05/04/2023 0900   ALKPHOS 69 05/04/2023 0900   AST 37 05/04/2023 0900   ALT 23 05/04/2023 0900   BILITOT 0.8 05/04/2023 0900      Impression and Plan: George [name] is a 73 year old gentleman. He has CLL.  His white cell count is going up slowly.  Again, he really is not that anemic and his platelet count is going back up which is nice to see.  His exam is relatively unremarkable aside of the small lymph node in the right axilla.  Still think we can follow him along.  I am sure that at some point, we probably will have to treat him.  I would like to see him back in about 6 weeks.  At that time, I am sure that I will probably talk to him about starting therapy.  Hopefully, we can get him on oral therapy that will be time-limited so that he will not need to be on treatment and lifelong.    Ivor Mars, MD 2/10/202510:10 AM

## 2023-05-06 ENCOUNTER — Other Ambulatory Visit (HOSPITAL_COMMUNITY): Payer: Self-pay

## 2023-05-11 ENCOUNTER — Other Ambulatory Visit: Payer: Self-pay

## 2023-05-11 ENCOUNTER — Other Ambulatory Visit (HOSPITAL_COMMUNITY): Payer: Self-pay

## 2023-06-04 ENCOUNTER — Other Ambulatory Visit (HOSPITAL_COMMUNITY): Payer: Self-pay

## 2023-06-04 MED ORDER — CEPHALEXIN 500 MG PO CAPS
500.0000 mg | ORAL_CAPSULE | Freq: Two times a day (BID) | ORAL | 0 refills | Status: DC
Start: 2023-06-04 — End: 2023-07-13
  Filled 2023-06-04: qty 20, 10d supply, fill #0

## 2023-06-15 ENCOUNTER — Other Ambulatory Visit: Payer: Self-pay

## 2023-06-15 ENCOUNTER — Encounter: Payer: Self-pay | Admitting: Hematology & Oncology

## 2023-06-15 ENCOUNTER — Telehealth: Payer: Self-pay

## 2023-06-15 ENCOUNTER — Telehealth: Payer: Self-pay | Admitting: Pharmacy Technician

## 2023-06-15 ENCOUNTER — Inpatient Hospital Stay (HOSPITAL_BASED_OUTPATIENT_CLINIC_OR_DEPARTMENT_OTHER): Payer: PPO | Admitting: Hematology & Oncology

## 2023-06-15 ENCOUNTER — Inpatient Hospital Stay: Payer: PPO | Attending: Hematology & Oncology

## 2023-06-15 ENCOUNTER — Other Ambulatory Visit (HOSPITAL_COMMUNITY): Payer: Self-pay

## 2023-06-15 VITALS — BP 136/77 | HR 69 | Temp 98.3°F | Resp 16 | Ht 68.5 in | Wt 163.0 lb

## 2023-06-15 DIAGNOSIS — C9110 Chronic lymphocytic leukemia of B-cell type not having achieved remission: Secondary | ICD-10-CM | POA: Diagnosis not present

## 2023-06-15 LAB — CBC WITH DIFFERENTIAL (CANCER CENTER ONLY)
Abs Immature Granulocytes: 0.21 10*3/uL — ABNORMAL HIGH (ref 0.00–0.07)
Basophils Absolute: 0.1 10*3/uL (ref 0.0–0.1)
Basophils Relative: 0 %
Eosinophils Absolute: 0 10*3/uL (ref 0.0–0.5)
Eosinophils Relative: 0 %
HCT: 34.4 % — ABNORMAL LOW (ref 39.0–52.0)
Hemoglobin: 10.9 g/dL — ABNORMAL LOW (ref 13.0–17.0)
Immature Granulocytes: 0 %
Lymphocytes Relative: 92 %
Lymphs Abs: 136.7 10*3/uL — ABNORMAL HIGH (ref 0.7–4.0)
MCH: 33.3 pg (ref 26.0–34.0)
MCHC: 31.7 g/dL (ref 30.0–36.0)
MCV: 105.2 fL — ABNORMAL HIGH (ref 80.0–100.0)
Monocytes Absolute: 9.1 10*3/uL — ABNORMAL HIGH (ref 0.1–1.0)
Monocytes Relative: 6 %
Neutro Abs: 3.3 10*3/uL (ref 1.7–7.7)
Neutrophils Relative %: 2 %
Platelet Count: 216 10*3/uL (ref 150–400)
RBC: 3.27 MIL/uL — ABNORMAL LOW (ref 4.22–5.81)
RDW: 13.3 % (ref 11.5–15.5)
Smear Review: NORMAL
WBC Count: 149.4 10*3/uL (ref 4.0–10.5)
nRBC: 0 % (ref 0.0–0.2)

## 2023-06-15 LAB — CMP (CANCER CENTER ONLY)
ALT: 16 U/L (ref 0–44)
AST: 28 U/L (ref 15–41)
Albumin: 4.8 g/dL (ref 3.5–5.0)
Alkaline Phosphatase: 74 U/L (ref 38–126)
Anion gap: 8 (ref 5–15)
BUN: 12 mg/dL (ref 8–23)
CO2: 27 mmol/L (ref 22–32)
Calcium: 9.2 mg/dL (ref 8.9–10.3)
Chloride: 93 mmol/L — ABNORMAL LOW (ref 98–111)
Creatinine: 0.76 mg/dL (ref 0.61–1.24)
GFR, Estimated: >60 mL/min (ref >60)
Glucose, Bld: 65 mg/dL — ABNORMAL LOW (ref 70–99)
Potassium: 4.7 mmol/L (ref 3.5–5.1)
Sodium: 128 mmol/L — ABNORMAL LOW (ref 135–145)
Total Bilirubin: 0.8 mg/dL (ref 0.0–1.2)
Total Protein: 6.8 g/dL (ref 6.5–8.1)

## 2023-06-15 LAB — LACTATE DEHYDROGENASE: LDH: 89 U/L — ABNORMAL LOW (ref 98–192)

## 2023-06-15 LAB — SAVE SMEAR(SSMR), FOR PROVIDER SLIDE REVIEW

## 2023-06-15 MED ORDER — FAMCICLOVIR 500 MG PO TABS
500.0000 mg | ORAL_TABLET | Freq: Every day | ORAL | 8 refills | Status: DC
  Filled 2023-06-15: qty 30, 30d supply, fill #0
  Filled 2023-07-28: qty 30, 30d supply, fill #1
  Filled 2023-08-24 – 2023-08-26 (×3): qty 30, 30d supply, fill #2
  Filled 2023-09-30: qty 30, 30d supply, fill #3
  Filled 2023-10-27: qty 30, 30d supply, fill #4
  Filled 2023-11-25: qty 30, 30d supply, fill #5
  Filled 2023-12-31 – 2024-01-05 (×3): qty 30, 30d supply, fill #6
  Filled 2024-02-03: qty 30, 30d supply, fill #7
  Filled 2024-03-02: qty 30, 30d supply, fill #8

## 2023-06-15 MED ORDER — ALLOPURINOL 100 MG PO TABS
100.0000 mg | ORAL_TABLET | Freq: Every day | ORAL | 0 refills | Status: DC
  Filled 2023-06-15: qty 30, 30d supply, fill #0

## 2023-06-15 MED ORDER — BRUKINSA 80 MG PO CAPS
160.0000 mg | ORAL_CAPSULE | Freq: Two times a day (BID) | ORAL | 12 refills | Status: DC
  Filled 2023-07-07: qty 120, 30d supply, fill #0
  Filled 2023-08-03: qty 120, 30d supply, fill #1
  Filled 2023-09-01 (×2): qty 120, 30d supply, fill #2
  Filled 2023-10-01: qty 120, 30d supply, fill #3
  Filled 2023-10-27 – 2023-10-30 (×2): qty 120, 30d supply, fill #4
  Filled 2023-11-25: qty 120, 30d supply, fill #5
  Filled 2023-12-16 – 2023-12-31 (×2): qty 120, 30d supply, fill #6
  Filled 2024-01-22 – 2024-01-27 (×2): qty 120, 30d supply, fill #7

## 2023-06-15 NOTE — Telephone Encounter (Signed)
 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  This will keep the out of pocket expense at $0.     The billing information is as follows and has been shared with [employer].   Member ID: [id number] Group ID: [id number] RxBin: [id number] Dates of Eligibility: 03/17/2023 through 06/14/2024  Fund: CLL ID [id number]  Patty Almedia Balls, CPhT Oncology Pharmacy Patient Advocate [facility] Direct Number: [phone] Fax: [phone]

## 2023-06-15 NOTE — Telephone Encounter (Signed)
 Dr Myna Hidalgo aware of critical high WBC 149.4. Will discuss during today's office visit. dph

## 2023-06-15 NOTE — Progress Notes (Unsigned)
 Hematology and Oncology Follow Up Visit  George [name] [id number] 03/24/1951 73 y.o. 06/15/2023   Principle Diagnosis:  CLL-stage C  Current Therapy:   Gazyva/Brukinsa/venetoclax --start cycle 1 on 07/14/2023     Interim History:  Mr.  George [name] is back for follow-up.  Unfortunately, I think we are going to have to start him on treatment.  His white cell count is going up fairly quickly.  Today, his white cell count was 149,000.  He is also becoming little more anemic.  He is still not felt any swollen lymph nodes.  He had a skin cancer removed from the back of his neck.  This is had a hard time healing up.  This could certainly be secondary to the CLL and the influence of the CLL on his immune system.  I really think that we can do time-limited therapy.  I would favor Gazyva/Brukinsa/venetoclax.  I think that we can get his white cell count down quickly with the Gazyva and Brukinsa and then add the venetoclax.  Will go to have to get a bone marrow biopsy on him.  We are to have to get a CT scan as part of our staging.  I really need to get prognostic markers to see what type of chromosomal abnormalities he may have.  Again he has a good performance status.  He is eating well.  Has not lost weight.  He has had no rashes.  There is been no bleeding.  He has had no change in bowel or bladder habits.  Overall, I would say that his performance status is probably ECOG 1.      Medications:  Current Outpatient Medications:    cephALEXin (KEFLEX) 500 MG capsule, Take 1 capsule (500 mg total) by mouth 2 (two) times daily with food for 10 days., Disp: 20 capsule, Rfl: 0   cholecalciferol (VITAMIN D) 1000 UNITS tablet, Take 1,000 Units by mouth daily. 2 tablets daily, Disp: , Rfl:    Coenzyme Q10 (CO Q 10 PO), Take by mouth every morning. , Disp: , Rfl:    esomeprazole (NEXIUM) 20 MG capsule, Take 1 capsule (20 mg total) by mouth daily., Disp: 90 capsule, Rfl: 1   GLUCOSAMINE HCL PO, Take by  mouth every morning. , Disp: , Rfl:    losartan (COZAAR) 50 MG tablet, Take 1 tablet (50 mg total) by mouth daily., Disp: 90 tablet, Rfl: 1   MAGNESIUM PO, Take 400 mg by mouth., Disp: , Rfl:    NIACIN CR PO, Take 500 mg by mouth daily. , Disp: , Rfl:    Omega-3 Fatty Acids (FISH OIL CONCENTRATE PO), Take by mouth every morning. , Disp: , Rfl:    OVER THE COUNTER MEDICATION, 2 (two) times daily. CLEAR- One spray BID., Disp: , Rfl:    Probiotic Product (PROBIOTIC PO), Take by mouth daily., Disp: , Rfl:    psyllium (METAMUCIL) 58.6 % powder, Take 1 packet by mouth 2 (two) times daily., Disp: , Rfl:    rosuvastatin (CRESTOR) 40 MG tablet, Take 1 tablet (40 mg total) by mouth daily., Disp: 90 tablet, Rfl: 1   Zinc 30 MG TABS, Take by mouth every morning., Disp: , Rfl:   Allergies:  Allergies  Allergen Reactions   Doxycycline Other (See Comments)    Sore throat, red tongue, red skin  Patient states that he was also on a camping trip when the symptoms developed, so he is unsure if they were due to the doxycycline or something that  he encountered the mountains.    Past Medical History, Surgical history, Social history, and Family History were reviewed and updated.  Review of Systems: Review of Systems  Constitutional: Negative.   HENT: Negative.    Eyes: Negative.   Respiratory: Negative.    Cardiovascular: Negative.   Gastrointestinal: Negative.   Genitourinary: Negative.   Musculoskeletal: Negative.   Skin: Negative.   Neurological: Negative.   Endo/Heme/Allergies: Negative.   Psychiatric/Behavioral: Negative.       Physical Exam:  height is 5' 8.5" (1.74 m) and weight is 163 lb (73.9 kg). His oral temperature is 98.3 F (36.8 C). His blood pressure is 136/77 and his pulse is 69. His respiration is 16 and oxygen saturation is 99%.   Physical Exam Vitals reviewed.  Constitutional:      Comments: In the right axilla, he does have a small lymph node.  It measures about 5 mm.  It  is mobile and nontender.  In the left axilla, I really cannot palpate any adenopathy.     HENT:     Head: Normocephalic and atraumatic.  Eyes:     Pupils: Pupils are equal, round, and reactive to light.  Cardiovascular:     Rate and Rhythm: Normal rate and regular rhythm.     Heart sounds: Normal heart sounds.  Pulmonary:     Effort: Pulmonary effort is normal.     Breath sounds: Normal breath sounds.  Abdominal:     General: Bowel sounds are normal.     Palpations: Abdomen is soft.  Musculoskeletal:        General: No tenderness or deformity. Normal range of motion.     Cervical back: Normal range of motion.  Lymphadenopathy:     Cervical: No cervical adenopathy.  Skin:    General: Skin is warm and dry.     Findings: No erythema or rash.  Neurological:     Mental Status: He is alert and oriented to person, place, and time.  Psychiatric:        Behavior: Behavior normal.        Thought Content: Thought content normal.        Judgment: Judgment normal.     Lab Results  Component Value Date   WBC 149.4 (HH) 06/15/2023   HGB 10.9 (L) 06/15/2023   HCT 34.4 (L) 06/15/2023   MCV 105.2 (H) 06/15/2023   PLT 216 06/15/2023     Chemistry      Component Value Date/Time   NA 128 (L) 06/15/2023 1040   NA 131 (L) 02/03/2023 0900   K 4.7 06/15/2023 1040   CL 93 (L) 06/15/2023 1040   CO2 27 06/15/2023 1040   BUN 12 06/15/2023 1040   BUN 7 (L) 02/03/2023 0900   CREATININE 0.76 06/15/2023 1040   CREATININE 0.73 10/15/2015 0909   GLU 77 02/08/2022 0000      Component Value Date/Time   CALCIUM 9.2 06/15/2023 1040   ALKPHOS 74 06/15/2023 1040   AST 28 06/15/2023 1040   ALT 16 06/15/2023 1040   BILITOT 0.8 06/15/2023 1040      Impression and Plan: George [name] is a 73 year old gentleman. He has CLL.  We have been following him for probably about 16 or 17 years.  Now, organ to have to get him started on treatment.  I feel confident that we will be able to get him into  remission.  Again hopefully, we can do time-limited therapy and only have him on treatment for  maybe 2 years.  There is no rush to get him started on treatment.  We can easily get him started after Easter.  I would like to get CT scans and a bone marrow biopsy on him so we can see exactly what his disease status is right now.  He is in great shape.  I do not see any reason why we cannot be aggressive.  I will plan to have him come back when he starts his Gazyva.    Josph Macho, MD 3/24/202511:47 AM

## 2023-06-15 NOTE — Telephone Encounter (Signed)
 Oral Oncology Patient Advocate Encounter  Prior Authorization for Brukinsa has been approved.    PA# [id number] Effective dates: 06/15/2023 through 06/14/2024  Patients co-pay is $1,784.10.    Patty Almedia Balls, CPhT Oncology Pharmacy Patient Advocate [facility] Direct Number: [phone] Fax: [phone]

## 2023-06-15 NOTE — Telephone Encounter (Signed)
 Oral Oncology Patient Advocate Encounter   Received notification that prior authorization for Brukinsa is required.   PA submitted on 06/15/2023 Key [id number] Status is pending     Patty Almedia Balls, CPhT Oncology Pharmacy Patient Advocate [facility] Direct Number: [phone] Fax: [phone]

## 2023-06-16 ENCOUNTER — Encounter: Payer: Self-pay | Admitting: Hematology & Oncology

## 2023-06-16 ENCOUNTER — Telehealth: Payer: Self-pay | Admitting: Pharmacist

## 2023-06-16 LAB — KAPPA/LAMBDA LIGHT CHAINS
Kappa free light chain: 45.6 mg/L — ABNORMAL HIGH (ref 3.3–19.4)
Kappa, lambda light chain ratio: 6.08 — ABNORMAL HIGH (ref 0.26–1.65)
Lambda free light chains: 7.5 mg/L (ref 5.7–26.3)

## 2023-06-16 LAB — IGG, IGA, IGM
IgA: 61 mg/dL (ref 61–437)
IgG (Immunoglobin G), Serum: 697 mg/dL (ref 603–1613)
IgM (Immunoglobulin M), Srm: 32 mg/dL (ref 15–143)

## 2023-06-16 NOTE — Progress Notes (Signed)
 START OFF PATHWAY REGIMEN - Lymphoma and CLL   [id number]:Obinutuzumab IV D1,8,15 Cycle 1/D1 Cycles 2-6 + Chlorambucil PO D1,15 Cycles 1-6 q28 Days:   A cycle is every 28 days:     Obinutuzumab      Obinutuzumab      Obinutuzumab      Obinutuzumab      Chlorambucil   **Always confirm dose/schedule in your pharmacy ordering system**  Patient Characteristics: Chronic Lymphocytic Leukemia (CLL), Treatment Indicated, First Line Disease Type: Chronic Lymphocytic Leukemia (CLL) Disease Type: Not Applicable Disease Type: Not Applicable Treatment Indicated<= Treatment Indicated Line of Therapy: First Line Intent of Therapy: Curative Intent, Not Discussed with Patient

## 2023-06-16 NOTE — Telephone Encounter (Signed)
 Oral Oncology Pharmacist Encounter  Received new prescription for Brukinsa (zanubrutinib) for the treatment of CLL, (per MD note, venetoclax may be added at later date) planned duration until disease progression or unacceptable drug toxicity.  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 Patient LDH U/L. Uric acid on 05/04/23 4.8 mg/dL. Prescription dose and frequency assessed for appropriateness.  Current medication list in Epic reviewed, no relevant/significant DDIs with Brukinsa identified.  Evaluated chart and no patient barriers to medication adherence noted.   Prescription has been e-scribed to the [facility] for benefits analysis and approval.  Oral Oncology Clinic will continue to follow for insurance authorization, copayment issues, initial counseling and start date.  Sherry Ruffing, PharmD, BCPS, BCOP Hematology/Oncology Clinical Pharmacist [facility] and [facility] Oral Chemotherapy Navigation Clinics [phone] 06/16/2023 9:13 AM

## 2023-06-17 ENCOUNTER — Other Ambulatory Visit: Payer: Self-pay

## 2023-06-22 ENCOUNTER — Other Ambulatory Visit: Payer: Self-pay | Admitting: Radiology

## 2023-06-22 DIAGNOSIS — Z01812 Encounter for preprocedural laboratory examination: Secondary | ICD-10-CM

## 2023-06-22 NOTE — H&P (Signed)
 Chief Complaint: Patient was seen in consultation today for CLL at the request of Ennever,Peter R  Referring Physician(s): Ennever,Peter R  Supervising Physician: Richarda Overlie  Patient Status: [facility] - Out-pt  History of Present Illness: UNKNOWN George [name] is a 73 y.o. male with PMHx significant for HTN, hyperlipidemia, GERD, squamous cell skin cancer and CLL. He follows with Dr. Myna Hidalgo and is now going to begin treatment for CLL given his worsening labs. Request received for bone marrow biopsy.   The patient denies any current chest pain or shortness of breath. The patient denies any recent infections, fever or chills. The patient denies any history of sleep apnea or chronic oxygen use. He has no known complications to sedation.    Past Medical History:  Diagnosis Date   CLL (chronic lymphocytic leukemia) (HCC) 03/25/2007   Colon polyp    Elevated BP 04/23/2014   GERD (gastroesophageal reflux disease)    High cholesterol    History of chicken pox    childhood   Preventative health care 10/23/2013   Tremor of right hand 04/17/2014   and left hand    Past Surgical History:  Procedure Laterality Date   COLONOSCOPY     ESOPHAGEAL DILATION  03/25/2003   [facility]   HERNIA REPAIR  1972   right groin   POLYPECTOMY      Allergies: Doxycycline  Medications: Prior to Admission medications   Medication Sig Start Date End Date Taking? Authorizing Provider  allopurinol (ZYLOPRIM) 100 MG tablet Take 1 tablet (100 mg total) by mouth daily. 06/15/23   Josph Macho, MD  cephALEXin (KEFLEX) 500 MG capsule Take 1 capsule (500 mg total) by mouth 2 (two) times daily with food for 10 days. 06/04/23     cholecalciferol (VITAMIN D) 1000 UNITS tablet Take 1,000 Units by mouth daily. 2 tablets daily    [provider]  Coenzyme Q10 (CO Q 10 PO) Take by mouth every morning.     [provider]  esomeprazole (NEXIUM) 20 MG capsule Take 1 capsule (20 mg total) by mouth  daily. 02/10/23   Melida Quitter, PA  famciclovir (FAMVIR) 500 MG tablet Take 1 tablet (500 mg total) by mouth daily. 06/15/23   Josph Macho, MD  GLUCOSAMINE HCL PO Take by mouth every morning.     [provider]  losartan (COZAAR) 50 MG tablet Take 1 tablet (50 mg total) by mouth daily. 02/10/23   Melida Quitter, PA  MAGNESIUM PO Take 400 mg by mouth.    [provider]  NIACIN CR PO Take 500 mg by mouth daily.     [provider]  Omega-3 Fatty Acids (FISH OIL CONCENTRATE PO) Take by mouth every morning.     [provider]  OVER THE COUNTER MEDICATION 2 (two) times daily. CLEAR- One spray BID.    [provider]  Probiotic Product (PROBIOTIC PO) Take by mouth daily.    [provider]  psyllium (METAMUCIL) 58.6 % powder Take 1 packet by mouth 2 (two) times daily.    [provider]  rosuvastatin (CRESTOR) 40 MG tablet Take 1 tablet (40 mg total) by mouth daily. 02/10/23   Melida Quitter, PA  zanubrutinib (BRUKINSA) 80 MG capsule Take 2 capsules (160 mg total) by mouth 2 (two) times daily. 06/15/23   Josph Macho, MD  Zinc 30 MG TABS Take by mouth every morning.    [provider]     [facility]  History  Problem Relation Age of Onset   Hyperlipidemia Mother    Hypertension Mother        mother-father   Pulmonary fibrosis Mother    Diabetes Father    Heart disease Father        congestive heart failure   Other Father        brain tumor   Mental illness Sister        depression   GER disease Son    Stroke Maternal Grandmother    Diabetes Maternal Grandmother    Diabetes Other        mother -father   Prostate cancer Neg Hx    Breast cancer Neg Hx    Colon cancer Neg Hx     Social History   Socioeconomic History   Marital status: Married    Spouse name: Annice Pih   Number of children: 1   Years of education: Not on file   Highest education level: Not on file  Occupational History   Not on  file  Tobacco Use   Smoking status: Former    Current packs/day: 0.00    Average packs/day: 1 pack/day for 22.1 years (22.1 ttl pk-yrs)    Types: Cigarettes    Start date: 02/05/1970    Quit date: 03/07/1992    Years since quitting: 31.3    Passive exposure: Never   Smokeless tobacco: Never   Tobacco comments:    quit 21 years ago  Vaping Use   Vaping status: Never Used  Substance and Sexual Activity   Alcohol use: Yes    Alcohol/week: 7.0 standard drinks of alcohol    Types: 7 Cans of beer per week    Comment: was 5 beers/day now 1 beer/day   Drug use: No   Sexual activity: Yes    Comment: lives with wife, no dietary restirctions  Other Topics Concern   Not on file  Social History Narrative   Lives at home with wife   Right handed   Caffeine: none   Social Drivers of Corporate investment banker Strain: Not on file  Food Insecurity: Not on file  Transportation Needs: Not on file  Physical Activity: Not on file  Stress: Not on file  Social Connections: Not on file    Review of Systems: A 12 point ROS discussed and pertinent positives are indicated in the HPI above.  All other systems are negative.  Review of Systems  Vital Signs: BP (!) 144/85   Pulse 76   Temp 97.9 F (36.6 C) (Oral)   Resp 20   Ht 5' 8.5" (1.74 m)   Wt 162 lb 14.7 oz (73.9 kg)   SpO2 95%   BMI 24.41 kg/m   Physical Exam Constitutional:      General: He is not in acute distress. HENT:     Head: Normocephalic and atraumatic.  Cardiovascular:     Rate and Rhythm: Normal rate and regular rhythm.  Pulmonary:     Effort: Pulmonary effort is normal. No respiratory distress.  Neurological:     Mental Status: He is alert and oriented to person, place, and time.     Imaging: No results found.  Labs:  CBC: Recent Labs    02/03/23 0900 03/02/23 0814 05/04/23 0900 06/15/23 1040  WBC 117.5* 129.2* 140.0* 149.4*  HGB 11.7* 11.8* 11.5* 10.9*  HCT 36.1* 36.0* 36.0* 34.4*  PLT 148*  184 173 216    COAGS: No results for input(s): "INR", "APTT"  in the last 8760 hours.  BMP: Recent Labs    08/25/22 0927 02/03/23 0900 03/02/23 0814 05/04/23 0900 06/15/23 1040  NA 133* 131* 129* 135 128*  K 4.3 4.4 4.2 4.9 4.7  CL 99 94* 93* 99 93*  CO2 27 22 27 28 27   GLUCOSE 97 73 102* 91 65*  BUN 7* 7* 7* 9 12  CALCIUM 9.1 9.4 9.4 9.9 9.2  CREATININE 0.78 0.69* 0.74 0.80 0.76  GFRNONAA >60  --  >60 >60 >60    LIVER FUNCTION TESTS: Recent Labs    02/03/23 0900 03/02/23 0814 05/04/23 0900 06/15/23 1040  BILITOT 0.7 0.7 0.8 0.8  AST 37 37 37 28  ALT 21 22 23 16   ALKPHOS 94 91 69 74  PROT 6.4 7.1 6.8 6.8  ALBUMIN 4.5 4.6 4.7 4.8    Assessment and Plan: This is a 73 year old male with PMHx significant for HTN, hyperlipidemia, GERD, squamous cell skin cancer and CLL. He follows with Dr. Myna Hidalgo and is now going to begin treatment for CLL given his worsening labs. Request received for bone marrow biopsy.   The patient has been NPO, labs and vitals have been reviewed.  Risks and benefits of image guided bone marrow biopsy with moderate sedation was discussed with the patient and/or patient's family including, but not limited to bleeding, infection, damage to adjacent structures or low yield requiring additional tests.  All of the questions were answered and there is agreement to proceed.  Consent signed and in chart.   Thank you for this interesting consult.  I greatly enjoyed meeting George [name] and look forward to participating in their care.  A copy of this report was sent to the requesting provider on this date.  Electronically Signed: Berneta Levins, PA-C 06/24/2023, 7:53 AM   I spent a total of 15 Minutes in face to face in clinical consultation, greater than 50% of which was counseling/coordinating care for CLL.

## 2023-06-23 NOTE — Progress Notes (Signed)
 Patient for IR Bone Marrow Biopsy on Wed 06/24/23, I called and spoke with the patient on the phone and gave pre-procedure instructions. Pt was made aware to be here at 7:30a, NPO after MN prior to procedure as well as driver post procedure/recovery/discharge. Pt stated understanding.  Called 06/23/23

## 2023-06-24 ENCOUNTER — Ambulatory Visit
Admission: RE | Admit: 2023-06-24 | Discharge: 2023-06-24 | Disposition: A | Discharge status: Home / Self Care | Source: Ambulatory Visit | Attending: Hematology & Oncology | Admitting: Hematology & Oncology

## 2023-06-24 ENCOUNTER — Other Ambulatory Visit: Payer: Self-pay

## 2023-06-24 ENCOUNTER — Encounter: Payer: Self-pay | Admitting: Radiology

## 2023-06-24 DIAGNOSIS — Z85828 Personal history of other malignant neoplasm of skin: Secondary | ICD-10-CM | POA: Diagnosis not present

## 2023-06-24 DIAGNOSIS — Z01812 Encounter for preprocedural laboratory examination: Secondary | ICD-10-CM

## 2023-06-24 DIAGNOSIS — K219 Gastro-esophageal reflux disease without esophagitis: Secondary | ICD-10-CM | POA: Insufficient documentation

## 2023-06-24 DIAGNOSIS — I10 Essential (primary) hypertension: Secondary | ICD-10-CM | POA: Diagnosis not present

## 2023-06-24 DIAGNOSIS — E785 Hyperlipidemia, unspecified: Secondary | ICD-10-CM | POA: Insufficient documentation

## 2023-06-24 DIAGNOSIS — C9110 Chronic lymphocytic leukemia of B-cell type not having achieved remission: Secondary | ICD-10-CM | POA: Diagnosis present

## 2023-06-24 DIAGNOSIS — Z87891 Personal history of nicotine dependence: Secondary | ICD-10-CM | POA: Insufficient documentation

## 2023-06-24 DIAGNOSIS — Z1379 Encounter for other screening for genetic and chromosomal anomalies: Secondary | ICD-10-CM | POA: Insufficient documentation

## 2023-06-24 HISTORY — PX: IR BONE MARROW BIOPSY & ASPIRATION: IMG5727

## 2023-06-24 LAB — CBC WITH DIFFERENTIAL/PLATELET
Abs Immature Granulocytes: 0.19 10*3/uL — ABNORMAL HIGH (ref 0.00–0.07)
Basophils Absolute: 0 10*3/uL (ref 0.0–0.1)
Basophils Relative: 0 %
Eosinophils Absolute: 0 10*3/uL (ref 0.0–0.5)
Eosinophils Relative: 0 %
HCT: 36.5 % — ABNORMAL LOW (ref 39.0–52.0)
Hemoglobin: 11.8 g/dL — ABNORMAL LOW (ref 13.0–17.0)
Immature Granulocytes: 0 %
Lymphocytes Relative: 93 %
Lymphs Abs: 145 10*3/uL — ABNORMAL HIGH (ref 0.7–4.0)
MCH: 33.4 pg (ref 26.0–34.0)
MCHC: 32.3 g/dL (ref 30.0–36.0)
MCV: 103.4 fL — ABNORMAL HIGH (ref 80.0–100.0)
Monocytes Absolute: 7.8 10*3/uL — ABNORMAL HIGH (ref 0.1–1.0)
Monocytes Relative: 5 %
Neutro Abs: 3.4 10*3/uL (ref 1.7–7.7)
Neutrophils Relative %: 2 %
Platelets: 138 10*3/uL — ABNORMAL LOW (ref 150–400)
RBC: 3.53 MIL/uL — ABNORMAL LOW (ref 4.22–5.81)
RDW: 13.3 % (ref 11.5–15.5)
Smear Review: NORMAL
WBC: 156.4 10*3/uL (ref 4.0–10.5)
nRBC: 0 % (ref 0.0–0.2)

## 2023-06-24 LAB — PATHOLOGIST SMEAR REVIEW

## 2023-06-24 MED ORDER — MIDAZOLAM HCL 2 MG/2ML IJ SOLN
INTRAMUSCULAR | Status: AC
  Filled 2023-06-24: qty 2

## 2023-06-24 MED ORDER — LIDOCAINE 1 % OPTIME INJ - NO CHARGE
10.0000 mL | Freq: Once | INTRAMUSCULAR | Status: AC
  Administered 2023-06-24: 10 mL
  Filled 2023-06-24: qty 10

## 2023-06-24 MED ORDER — MIDAZOLAM HCL 2 MG/2ML IJ SOLN
INTRAMUSCULAR | Status: AC | PRN
  Administered 2023-06-24: 1 mg via INTRAVENOUS

## 2023-06-24 MED ORDER — SODIUM CHLORIDE 0.9 % IV SOLN: INTRAVENOUS | Status: DC

## 2023-06-24 MED ORDER — FENTANYL CITRATE (PF) 100 MCG/2ML IJ SOLN
INTRAMUSCULAR | Status: AC | PRN
  Administered 2023-06-24 (×2): 50 ug via INTRAVENOUS

## 2023-06-24 MED ORDER — HEPARIN SOD (PORK) LOCK FLUSH 100 UNIT/ML IV SOLN
INTRAVENOUS | Status: AC
  Filled 2023-06-24: qty 5

## 2023-06-24 MED ORDER — MIDAZOLAM HCL 5 MG/5ML IJ SOLN
INTRAMUSCULAR | Status: AC | PRN
  Administered 2023-06-24: 1 mg via INTRAVENOUS

## 2023-06-24 MED ORDER — FENTANYL CITRATE (PF) 100 MCG/2ML IJ SOLN
INTRAMUSCULAR | Status: AC
  Filled 2023-06-24: qty 2

## 2023-06-24 MED ORDER — HEPARIN SOD (PORK) LOCK FLUSH 100 UNIT/ML IV SOLN: 500.0000 [IU] | Freq: Once | INTRAVENOUS | Status: DC

## 2023-06-24 NOTE — Progress Notes (Signed)
 Patient clinically stable post IR BMB per Dr Lowella Dandy, tolerated well. Vitals stable pre and post procedure. Received Versed 2 mg along with Fentanyl 100 mcg IV for procedure. Report given to Weldon Picking RN post procedure/specials/15

## 2023-06-24 NOTE — Progress Notes (Signed)
 Patient without complaints, wife present with discharge instructions given , vitals stable/

## 2023-06-24 NOTE — Procedures (Signed)
 Interventional Radiology Procedure:   Indications: CLL  Procedure: Image guided bone marrow biopsy  Findings: 2 aspirates and 1 core from right ilium  Complications: None     EBL: Minimal, less than 10 ml  Plan: Discharge to home in one hour.   Marnette Perkins R. Lowella Dandy, MD  Pager: [phone]

## 2023-06-25 ENCOUNTER — Ambulatory Visit (HOSPITAL_COMMUNITY)
Admission: RE | Admit: 2023-06-25 | Discharge: 2023-06-25 | Disposition: A | Discharge status: Home / Self Care | Source: Ambulatory Visit | Attending: Hematology & Oncology | Admitting: Hematology & Oncology

## 2023-06-25 ENCOUNTER — Encounter: Payer: Self-pay | Admitting: Hematology & Oncology

## 2023-06-25 ENCOUNTER — Encounter (HOSPITAL_COMMUNITY): Payer: Self-pay

## 2023-06-25 DIAGNOSIS — C9110 Chronic lymphocytic leukemia of B-cell type not having achieved remission: Secondary | ICD-10-CM

## 2023-06-25 MED ORDER — SODIUM CHLORIDE (PF) 0.9 % IJ SOLN
INTRAMUSCULAR | Status: AC
  Filled 2023-06-25: qty 50

## 2023-06-25 MED ORDER — IOHEXOL 300 MG/ML  SOLN
100.0000 mL | Freq: Once | INTRAMUSCULAR | Status: AC | PRN
  Administered 2023-06-25: 100 mL via INTRAVENOUS

## 2023-06-26 LAB — SURGICAL PATHOLOGY

## 2023-07-02 ENCOUNTER — Encounter (HOSPITAL_COMMUNITY): Payer: Self-pay | Admitting: Hematology & Oncology

## 2023-07-06 NOTE — Progress Notes (Signed)
 Pharmacist Chemotherapy Monitoring - Initial Assessment    Anticipated start date: 07/13/23   The following has been reviewed per standard work regarding the patient's treatment regimen: The patient's diagnosis, treatment plan and drug doses, and organ/hematologic function Lab orders and baseline tests specific to treatment regimen  The treatment plan start date, drug sequencing, and pre-medications Prior authorization status  Patient's documented medication list, including drug-drug interaction screen and prescriptions for anti-emetics and supportive care specific to the treatment regimen The drug concentrations, fluid compatibility, administration routes, and timing of the medications to be used The patient's access for treatment and lifetime cumulative dose history, if applicable  The patient's medication allergies and previous infusion related reactions, if applicable   Changes made to treatment plan:  treatment plan date  Follow up needed:  Hep testing last done 07/2021.  Retest?   George [name], [facility], 07/06/2023  8:04 AM

## 2023-07-07 ENCOUNTER — Other Ambulatory Visit (HOSPITAL_COMMUNITY): Payer: Self-pay

## 2023-07-07 ENCOUNTER — Other Ambulatory Visit: Payer: Self-pay | Admitting: Pharmacy Technician

## 2023-07-07 NOTE — Progress Notes (Signed)
 Oral Chemotherapy Pharmacist Encounter  Patient was counseled under telephone encounter from 06/16/23.  Jude Norton, PharmD, BCPS, BCOP Hematology/Oncology Clinical Pharmacist [facility] Oral Chemotherapy Navigation Clinics [phone] 07/07/2023 3:12 PM

## 2023-07-07 NOTE — Progress Notes (Signed)
 Specialty Pharmacy Initial Fill Coordination Note  George [name] is a 73 y.o. male contacted today regarding refills of specialty medication(s) Zanubrutinib (Brukinsa) .  Patient requested Delivery  on 07/09/23  to verified address [address] [name] [address]   Medication will be filled on 07/08/2023.   Patient is aware of $0 copayment. Radio producer.  Patty Benjaman Branch, CPhT Oncology Pharmacy Patient Advocate [facility] Direct Number: [phone] Fax: [phone]

## 2023-07-07 NOTE — Telephone Encounter (Signed)
 Oral Chemotherapy Pharmacist Encounter  I spoke with patient for overview of: Brukinsa (zanubrutinib) for the treatment of CLL, (per MD note, venetoclax may be added at later date) planned duration until disease progression or unacceptable drug toxicity.   Counseled patient on administration, dosing, side effects, monitoring, drug-food interactions, safe handling, storage, and disposal.  Patient will take Brukinsa 80mg  capsules, 2 capsules (160mg ) by mouth every 12 hours.  Patient knows to avoid grapefruit or grapefruit juice while on therapy with Brukinsa.  Brukinsa start date: 07/13/23  Adverse effects include but are not limited to: bruising, decreased blood counts, rash, diarrhea, fatigue, musculoskeletal pain/arthralgias, peripheral edema, change in kidney function (increase of serum creatinine), hyperglycemia, and hemorrhage.   Patient will obtain anti diarrheal and alert the office of 4 or more loose stools above baseline.  Reviewed with patient importance of keeping a medication schedule and plan for any missed doses. No barriers to medication adherence identified.  Medication reconciliation performed and medication/allergy list updated.  All questions answered.  Mr. Beckford voiced understanding and appreciation.   Medication education handout placed in mail for patient. Patient knows to call the office with questions or concerns. Oral Chemotherapy Clinic phone number provided to patient.   Jude Norton, PharmD, BCPS, BCOP Hematology/Oncology Clinical Pharmacist [facility] Oral Chemotherapy Navigation Clinics [phone] 07/07/2023 2:42 PM

## 2023-07-08 ENCOUNTER — Other Ambulatory Visit: Payer: Self-pay

## 2023-07-09 ENCOUNTER — Other Ambulatory Visit (HOSPITAL_COMMUNITY): Payer: Self-pay

## 2023-07-13 ENCOUNTER — Inpatient Hospital Stay (HOSPITAL_BASED_OUTPATIENT_CLINIC_OR_DEPARTMENT_OTHER): Admitting: Hematology & Oncology

## 2023-07-13 ENCOUNTER — Telehealth: Payer: Self-pay | Admitting: *Deleted

## 2023-07-13 ENCOUNTER — Inpatient Hospital Stay

## 2023-07-13 ENCOUNTER — Encounter: Payer: Self-pay | Admitting: Hematology & Oncology

## 2023-07-13 ENCOUNTER — Other Ambulatory Visit: Payer: Self-pay

## 2023-07-13 ENCOUNTER — Inpatient Hospital Stay: Attending: Hematology & Oncology

## 2023-07-13 VITALS — BP 133/75 | HR 73 | Temp 98.6°F | Resp 18 | Ht 68.5 in | Wt 159.0 lb

## 2023-07-13 VITALS — BP 141/78 | HR 90 | Temp 97.9°F | Resp 20

## 2023-07-13 DIAGNOSIS — Z5112 Encounter for antineoplastic immunotherapy: Secondary | ICD-10-CM | POA: Diagnosis present

## 2023-07-13 DIAGNOSIS — C9110 Chronic lymphocytic leukemia of B-cell type not having achieved remission: Secondary | ICD-10-CM | POA: Insufficient documentation

## 2023-07-13 DIAGNOSIS — Z79620 Long term (current) use of immunosuppressive biologic: Secondary | ICD-10-CM | POA: Insufficient documentation

## 2023-07-13 DIAGNOSIS — R945 Abnormal results of liver function studies: Secondary | ICD-10-CM | POA: Insufficient documentation

## 2023-07-13 LAB — CMP (CANCER CENTER ONLY)
ALT: 56 U/L — ABNORMAL HIGH (ref 0–44)
AST: 89 U/L — ABNORMAL HIGH (ref 15–41)
Albumin: 4.6 g/dL (ref 3.5–5.0)
Alkaline Phosphatase: 70 U/L (ref 38–126)
Anion gap: 10 (ref 5–15)
BUN: 8 mg/dL (ref 8–23)
CO2: 27 mmol/L (ref 22–32)
Calcium: 9.2 mg/dL (ref 8.9–10.3)
Chloride: 93 mmol/L — ABNORMAL LOW (ref 98–111)
Creatinine: 0.77 mg/dL (ref 0.61–1.24)
GFR, Estimated: >60 mL/min (ref >60)
Glucose, Bld: 122 mg/dL — ABNORMAL HIGH (ref 70–99)
Potassium: 3.8 mmol/L (ref 3.5–5.1)
Sodium: 130 mmol/L — ABNORMAL LOW (ref 135–145)
Total Bilirubin: 1.1 mg/dL (ref 0.0–1.2)
Total Protein: 6.3 g/dL — ABNORMAL LOW (ref 6.5–8.1)

## 2023-07-13 LAB — CBC WITH DIFFERENTIAL (CANCER CENTER ONLY)
Abs Immature Granulocytes: 0.17 10*3/uL — ABNORMAL HIGH (ref 0.00–0.07)
Basophils Absolute: 0.3 10*3/uL — ABNORMAL HIGH (ref 0.0–0.1)
Basophils Relative: 0 %
Eosinophils Absolute: 0 10*3/uL (ref 0.0–0.5)
Eosinophils Relative: 0 %
HCT: 33.3 % — ABNORMAL LOW (ref 39.0–52.0)
Hemoglobin: 10.8 g/dL — ABNORMAL LOW (ref 13.0–17.0)
Immature Granulocytes: 0 %
Lymphocytes Relative: 90 %
Lymphs Abs: 139.7 10*3/uL — ABNORMAL HIGH (ref 0.7–4.0)
MCH: 34.1 pg — ABNORMAL HIGH (ref 26.0–34.0)
MCHC: 32.4 g/dL (ref 30.0–36.0)
MCV: 105 fL — ABNORMAL HIGH (ref 80.0–100.0)
Monocytes Absolute: 12.3 10*3/uL — ABNORMAL HIGH (ref 0.1–1.0)
Monocytes Relative: 8 %
Neutro Abs: 3.2 10*3/uL (ref 1.7–7.7)
Neutrophils Relative %: 2 %
Platelet Count: 126 10*3/uL — ABNORMAL LOW (ref 150–400)
RBC: 3.17 MIL/uL — ABNORMAL LOW (ref 4.22–5.81)
RDW: 13.8 % (ref 11.5–15.5)
Smear Review: NORMAL
WBC Count: 155.7 10*3/uL (ref 4.0–10.5)
nRBC: 0 % (ref 0.0–0.2)

## 2023-07-13 LAB — HEPATITIS PANEL, ACUTE
HCV Ab: NONREACTIVE
Hep A IgM: NONREACTIVE
Hep B C IgM: NONREACTIVE
Hepatitis B Surface Ag: NONREACTIVE

## 2023-07-13 LAB — URIC ACID: Uric Acid, Serum: 3.4 mg/dL — ABNORMAL LOW (ref 3.7–8.6)

## 2023-07-13 LAB — LACTATE DEHYDROGENASE: LDH: 104 U/L (ref 98–192)

## 2023-07-13 MED ORDER — SODIUM CHLORIDE 0.9 % IV SOLN
100.0000 mg | Freq: Once | INTRAVENOUS | Status: AC
  Administered 2023-07-13: 100 mg via INTRAVENOUS
  Filled 2023-07-13: qty 4

## 2023-07-13 MED ORDER — SODIUM CHLORIDE 0.9 % IV SOLN: INTRAVENOUS | Status: DC

## 2023-07-13 MED ORDER — SODIUM CHLORIDE 0.9 % IV SOLN
20.0000 mg | Freq: Once | INTRAVENOUS | Status: AC
  Administered 2023-07-13: 20 mg via INTRAVENOUS
  Filled 2023-07-13: qty 20

## 2023-07-13 MED ORDER — DIPHENHYDRAMINE HCL 50 MG/ML IJ SOLN
50.0000 mg | Freq: Once | INTRAMUSCULAR | Status: AC
  Administered 2023-07-13: 50 mg via INTRAVENOUS
  Filled 2023-07-13: qty 1

## 2023-07-13 MED ORDER — ACETAMINOPHEN 325 MG PO TABS
650.0000 mg | ORAL_TABLET | Freq: Once | ORAL | Status: AC
  Administered 2023-07-13: 650 mg via ORAL
  Filled 2023-07-13: qty 2

## 2023-07-13 NOTE — Progress Notes (Signed)
 Pt. arrived for first time chemo with wife. In good spirit.  Pt. education reinforced. C1D1 infused per slow rate, pt. tolerated well.

## 2023-07-13 NOTE — Progress Notes (Signed)
 Ok to proceed with Gazyva  today with AST 89.  Jobe Mulder [place], [place], BCPS, BCOP 07/13/2023 10:32 AM

## 2023-07-13 NOTE — Progress Notes (Signed)
 Hematology and Oncology Follow Up Visit  George [name] [id number] 24-Jun-1950 73 y.o. 07/13/2023   Principle Diagnosis:  CLL-stage C  Current Therapy:   Gazyva /Brukinsa /venetoclax --start cycle 1 on 07/14/2023     Interim History:  [name] is back for follow-up.  So far, he is doing okay.  He will start his protocol today.  He is already taking the allopurinol  and the family are.  Noted that his LFTs are up a little bit.  Again I am sure this is medication related.  I am not sure which medication it might be.  I think that we can probably stop the allopurinol  for right now.  And we will have to just watch this closely.  He has not noted any problems with fever.  He has had no rashes.  There is been no bleeding.  He did have a bone marrow biopsy that was done.  This was done on 06/24/2023 .  The pathology report ([id number]) showed a markedly hypercellular marrow with mostly CLL.  There is about 90% CLL in the bone marrow.  I do not have back the Prognostic Panel yet.  He had a CT scan that was done.  This was done on 06/25/2023.  Not surprising, did show numerous lymph nodes throughout the chest, abdomen and pelvis.  Had mild splenomegaly.  There has been no cough or shortness of breath.  He has had no headache.  Overall, I would say performance status by ECOG 1.  Medications:  Current Outpatient Medications:    allopurinol  (ZYLOPRIM ) 100 MG tablet, Take 1 tablet (100 mg total) by mouth daily., Disp: 30 tablet, Rfl: 0   cholecalciferol (VITAMIN D ) 1000 UNITS tablet, Take 1,000 Units by mouth daily. 2 tablets daily, Disp: , Rfl:    Coenzyme Q10 (CO Q 10 PO), Take by mouth every morning. , Disp: , Rfl:    esomeprazole  (NEXIUM ) 20 MG capsule, Take 1 capsule (20 mg total) by mouth daily., Disp: 90 capsule, Rfl: 1   famciclovir  (FAMVIR ) 500 MG tablet, Take 1 tablet (500 mg total) by mouth daily., Disp: 30 tablet, Rfl: 8   GLUCOSAMINE HCL PO, Take by mouth every morning. , Disp: , Rfl:     losartan  (COZAAR ) 50 MG tablet, Take 1 tablet (50 mg total) by mouth daily., Disp: 90 tablet, Rfl: 1   OVER THE COUNTER MEDICATION, 2 (two) times daily. CLEAR- One spray BID., Disp: , Rfl:    rosuvastatin  (CRESTOR ) 40 MG tablet, Take 1 tablet (40 mg total) by mouth daily., Disp: 90 tablet, Rfl: 1   zanubrutinib  (BRUKINSA ) 80 MG capsule, Take 2 capsules (160 mg total) by mouth 2 (two) times daily., Disp: 120 capsule, Rfl: 12  Allergies:  Allergies  Allergen Reactions   Doxycycline  Other (See Comments)    Sore throat, red tongue, red skin  Patient states that he was also on a camping trip when the symptoms developed, so he is unsure if they were due to the doxycycline  or something that he encountered the mountains.    Past Medical History, Surgical history, Social history, and Family History were reviewed and updated.  Review of Systems: Review of Systems  Constitutional: Negative.   HENT: Negative.    Eyes: Negative.   Respiratory: Negative.    Cardiovascular: Negative.   Gastrointestinal: Negative.   Genitourinary: Negative.   Musculoskeletal: Negative.   Skin: Negative.   Neurological: Negative.   Endo/Heme/Allergies: Negative.   Psychiatric/Behavioral: Negative.       Physical Exam:  height is 5' 8.5" (1.74 m) and weight is 159 lb (72.1 kg). His oral temperature is 98.6 F (37 C). His blood pressure is 133/75 and his pulse is 73. His respiration is 18 and oxygen saturation is 100%.   Physical Exam Vitals reviewed.  Constitutional:      Comments: In the right axilla, he does have a small lymph node.  It measures about 5 mm.  It is mobile and nontender.  In the left axilla, I really cannot palpate any adenopathy.     HENT:     Head: Normocephalic and atraumatic.  Eyes:     Pupils: Pupils are equal, round, and reactive to light.  Cardiovascular:     Rate and Rhythm: Normal rate and regular rhythm.     Heart sounds: Normal heart sounds.  Pulmonary:     Effort:  Pulmonary effort is normal.     Breath sounds: Normal breath sounds.  Abdominal:     General: Bowel sounds are normal.     Palpations: Abdomen is soft.  Musculoskeletal:        General: No tenderness or deformity. Normal range of motion.     Cervical back: Normal range of motion.  Lymphadenopathy:     Cervical: No cervical adenopathy.  Skin:    General: Skin is warm and dry.     Findings: No erythema or rash.  Neurological:     Mental Status: He is alert and oriented to person, place, and time.  Psychiatric:        Behavior: Behavior normal.        Thought Content: Thought content normal.        Judgment: Judgment normal.      Lab Results  Component Value Date   WBC 156.4 (HH) 06/24/2023   HGB 11.8 (L) 06/24/2023   HCT 36.5 (L) 06/24/2023   MCV 103.4 (H) 06/24/2023   PLT 138 (L) 06/24/2023     Chemistry      Component Value Date/Time   NA 130 (L) 07/13/2023 0822   NA 131 (L) 02/03/2023 0900   K 3.8 07/13/2023 0822   CL 93 (L) 07/13/2023 0822   CO2 27 07/13/2023 0822   BUN 8 07/13/2023 0822   BUN 7 (L) 02/03/2023 0900   CREATININE 0.77 07/13/2023 0822   CREATININE 0.73 10/15/2015 0909   GLU 77 02/08/2022 0000      Component Value Date/Time   CALCIUM  9.2 07/13/2023 0822   ALKPHOS 70 07/13/2023 0822   AST 89 (H) 07/13/2023 0822   ALT 56 (H) 07/13/2023 0822   BILITOT 1.1 07/13/2023 2956      Impression and Plan: George [name] is a 73 year old gentleman. He has CLL.  We have been following him for probably about 16 or 17 years.    He will start his protocol today.  I do think that we can get him into remission.  It will be interesting to see with the CLL Prognostic panel says.  I saw that his LFTs are little bit elevated.  Again I have to believe this is either from the Famvir  or the allopurinol .  Again I am not sure which 1 would be the most likely culprit.  For right now, we will stop the allopurinol  and see how he does.  We will have to watch this  closely.  We will have to have his labs checked weekly for right now.  I will plan to see him back myself in about a month or so.  Ivor Mars, MD 4/21/20259:13 AM

## 2023-07-13 NOTE — Telephone Encounter (Signed)
 WBC 155.7 called by Dean Every in lab.  Dr Maria Shiner made aware.

## 2023-07-13 NOTE — Patient Instructions (Signed)
 [facility] - A DEPT OF [facility]  Discharge Instructions: Thank you for choosing [facility] to provide your oncology and hematology care.   If you have a lab appointment with the Cancer Center, please go directly to the Cancer Center and check in at the registration area.  Wear comfortable clothing and clothing appropriate for easy access to any Portacath or PICC line.   We strive to give you quality time with your provider. You may need to reschedule your appointment if you arrive late (15 or more minutes).  Arriving late affects you and other patients whose appointments are after yours.  Also, if you miss three or more appointments without notifying the office, you may be dismissed from the clinic at the provider's discretion.      For prescription refill requests, have your pharmacy contact our office and allow 72 hours for refills to be completed.    Today you received the following chemotherapy and/or immunotherapy agents Obinutuzumab        To help prevent nausea and vomiting after your treatment, we encourage you to take your nausea medication as directed.  BELOW ARE SYMPTOMS THAT SHOULD BE REPORTED IMMEDIATELY: *FEVER GREATER THAN 100.4 F (38 C) OR HIGHER *CHILLS OR SWEATING *NAUSEA AND VOMITING THAT IS NOT CONTROLLED WITH YOUR NAUSEA MEDICATION *UNUSUAL SHORTNESS OF BREATH *UNUSUAL BRUISING OR BLEEDING *URINARY PROBLEMS (pain or burning when urinating, or frequent urination) *BOWEL PROBLEMS (unusual diarrhea, constipation, pain near the anus) TENDERNESS IN MOUTH AND THROAT WITH OR WITHOUT PRESENCE OF ULCERS (sore throat, sores in mouth, or a toothache) UNUSUAL RASH, SWELLING OR PAIN  UNUSUAL VAGINAL DISCHARGE OR ITCHING   Items with * indicate a potential emergency and should be followed up as soon as possible or go to the Emergency Department if any problems should occur.  Please show the CHEMOTHERAPY ALERT CARD or  IMMUNOTHERAPY ALERT CARD at check-in to the Emergency Department and triage nurse. Should you have questions after your visit or need to cancel or reschedule your appointment, please contact [facility] - A DEPT OF [facility]  [phone] and follow the prompts.  Office hours are 8:00 a.m. to 4:30 p.m. Monday - Friday. Please note that voicemails left after 4:00 p.m. may not be returned until the following business day.  We are closed weekends and major holidays. You have access to a nurse at all times for urgent questions. Please call the main number to the clinic [phone] and follow the prompts.  For any non-urgent questions, you may also contact your provider using MyChart. We now offer e-Visits for anyone 21 and older to request care online for non-urgent symptoms. For details visit [email or web address].   Also download the MyChart app! Go to the app store, search "MyChart", open the app, select [facility], and log in with your MyChart username and password.

## 2023-07-14 ENCOUNTER — Other Ambulatory Visit: Payer: Self-pay

## 2023-07-14 ENCOUNTER — Inpatient Hospital Stay

## 2023-07-14 VITALS — BP 144/88 | HR 77 | Temp 97.9°F | Resp 18

## 2023-07-14 DIAGNOSIS — C9110 Chronic lymphocytic leukemia of B-cell type not having achieved remission: Secondary | ICD-10-CM

## 2023-07-14 DIAGNOSIS — R197 Diarrhea, unspecified: Secondary | ICD-10-CM

## 2023-07-14 DIAGNOSIS — Z5112 Encounter for antineoplastic immunotherapy: Secondary | ICD-10-CM | POA: Diagnosis not present

## 2023-07-14 DIAGNOSIS — K625 Hemorrhage of anus and rectum: Secondary | ICD-10-CM

## 2023-07-14 LAB — CBC WITH DIFFERENTIAL (CANCER CENTER ONLY)
Abs Immature Granulocytes: 0.24 10*3/uL — ABNORMAL HIGH (ref 0.00–0.07)
Basophils Absolute: 0.4 10*3/uL — ABNORMAL HIGH (ref 0.0–0.1)
Basophils Relative: 0 %
Eosinophils Absolute: 0 10*3/uL (ref 0.0–0.5)
Eosinophils Relative: 0 %
HCT: 34.5 % — ABNORMAL LOW (ref 39.0–52.0)
Hemoglobin: 11.3 g/dL — ABNORMAL LOW (ref 13.0–17.0)
Immature Granulocytes: 0 %
Lymphocytes Relative: 88 %
Lymphs Abs: 115.7 10*3/uL — ABNORMAL HIGH (ref 0.7–4.0)
MCH: 33.9 pg (ref 26.0–34.0)
MCHC: 32.8 g/dL (ref 30.0–36.0)
MCV: 103.6 fL — ABNORMAL HIGH (ref 80.0–100.0)
Monocytes Absolute: 10.9 10*3/uL — ABNORMAL HIGH (ref 0.1–1.0)
Monocytes Relative: 8 %
Neutro Abs: 5 10*3/uL (ref 1.7–7.7)
Neutrophils Relative %: 4 %
Platelet Count: 107 10*3/uL — ABNORMAL LOW (ref 150–400)
RBC: 3.33 MIL/uL — ABNORMAL LOW (ref 4.22–5.81)
RDW: 13.9 % (ref 11.5–15.5)
Smear Review: NORMAL
WBC Count: 132.2 10*3/uL (ref 4.0–10.5)
nRBC: 0 % (ref 0.0–0.2)

## 2023-07-14 LAB — CMP (CANCER CENTER ONLY)
ALT: 39 U/L (ref 0–44)
AST: 51 U/L — ABNORMAL HIGH (ref 15–41)
Albumin: 4.6 g/dL (ref 3.5–5.0)
Alkaline Phosphatase: 58 U/L (ref 38–126)
Anion gap: 10 (ref 5–15)
BUN: 12 mg/dL (ref 8–23)
CO2: 23 mmol/L (ref 22–32)
Calcium: 9.3 mg/dL (ref 8.9–10.3)
Chloride: 95 mmol/L — ABNORMAL LOW (ref 98–111)
Creatinine: 0.86 mg/dL (ref 0.61–1.24)
GFR, Estimated: >60 mL/min (ref >60)
Glucose, Bld: 131 mg/dL — ABNORMAL HIGH (ref 70–99)
Potassium: 4 mmol/L (ref 3.5–5.1)
Sodium: 128 mmol/L — ABNORMAL LOW (ref 135–145)
Total Bilirubin: 0.7 mg/dL (ref 0.0–1.2)
Total Protein: 6.5 g/dL (ref 6.5–8.1)

## 2023-07-14 MED ORDER — ACETAMINOPHEN 325 MG PO TABS
650.0000 mg | ORAL_TABLET | Freq: Once | ORAL | Status: AC
  Administered 2023-07-14: 650 mg via ORAL
  Filled 2023-07-14: qty 2

## 2023-07-14 MED ORDER — LOPERAMIDE HCL 2 MG PO CAPS
4.0000 mg | ORAL_CAPSULE | Freq: Once | ORAL | Status: AC
  Administered 2023-07-14: 4 mg via ORAL
  Filled 2023-07-14: qty 2

## 2023-07-14 MED ORDER — SODIUM CHLORIDE 0.9 % IV SOLN: INTRAVENOUS | Status: DC

## 2023-07-14 MED ORDER — DIPHENHYDRAMINE HCL 50 MG/ML IJ SOLN
50.0000 mg | Freq: Once | INTRAMUSCULAR | Status: AC
  Administered 2023-07-14: 50 mg via INTRAVENOUS
  Filled 2023-07-14: qty 1

## 2023-07-14 MED ORDER — SODIUM CHLORIDE 0.9 % IV SOLN
20.0000 mg | Freq: Once | INTRAVENOUS | Status: AC
  Administered 2023-07-14: 20 mg via INTRAVENOUS
  Filled 2023-07-14: qty 20

## 2023-07-14 MED ORDER — SODIUM CHLORIDE 0.9 % IV SOLN
900.0000 mg | Freq: Once | INTRAVENOUS | Status: AC
  Administered 2023-07-14: 900 mg via INTRAVENOUS
  Filled 2023-07-14: qty 36

## 2023-07-14 NOTE — Patient Instructions (Signed)
 [facility] - A DEPT OF [facility]  Discharge Instructions: Thank you for choosing [facility] to provide your oncology and hematology care.   If you have a lab appointment with the Cancer Center, please go directly to the Cancer Center and check in at the registration area.  Wear comfortable clothing and clothing appropriate for easy access to any Portacath or PICC line.   We strive to give you quality time with your provider. You may need to reschedule your appointment if you arrive late (15 or more minutes).  Arriving late affects you and other patients whose appointments are after yours.  Also, if you miss three or more appointments without notifying the office, you may be dismissed from the clinic at the provider's discretion.      For prescription refill requests, have your pharmacy contact our office and allow 72 hours for refills to be completed.    Today you received the following chemotherapy and/or immunotherapy agents Gazyva      To help prevent nausea and vomiting after your treatment, we encourage you to take your nausea medication as directed.  BELOW ARE SYMPTOMS THAT SHOULD BE REPORTED IMMEDIATELY: *FEVER GREATER THAN 100.4 F (38 C) OR HIGHER *CHILLS OR SWEATING *NAUSEA AND VOMITING THAT IS NOT CONTROLLED WITH YOUR NAUSEA MEDICATION *UNUSUAL SHORTNESS OF BREATH *UNUSUAL BRUISING OR BLEEDING *URINARY PROBLEMS (pain or burning when urinating, or frequent urination) *BOWEL PROBLEMS (unusual diarrhea, constipation, pain near the anus) TENDERNESS IN MOUTH AND THROAT WITH OR WITHOUT PRESENCE OF ULCERS (sore throat, sores in mouth, or a toothache) UNUSUAL RASH, SWELLING OR PAIN  UNUSUAL VAGINAL DISCHARGE OR ITCHING   Items with * indicate a potential emergency and should be followed up as soon as possible or go to the Emergency Department if any problems should occur.  Please show the CHEMOTHERAPY ALERT CARD or IMMUNOTHERAPY ALERT  CARD at check-in to the Emergency Department and triage nurse. Should you have questions after your visit or need to cancel or reschedule your appointment, please contact [facility] - A DEPT OF [facility]  [phone] and follow the prompts.  Office hours are 8:00 a.m. to 4:30 p.m. Monday - Friday. Please note that voicemails left after 4:00 p.m. may not be returned until the following business day.  We are closed weekends and major holidays. You have access to a nurse at all times for urgent questions. Please call the main number to the clinic [phone] and follow the prompts.  For any non-urgent questions, you may also contact your provider using MyChart. We now offer e-Visits for anyone 75 and older to request care online for non-urgent symptoms. For details visit [email or web address].   Also download the MyChart app! Go to the app store, search "MyChart", open the app, select , and log in with your MyChart username and password.

## 2023-07-14 NOTE — Progress Notes (Signed)
 George [name] reported that he started having diarrhea since last night around 12:00 am. He couldn't report the exact number of episodes. He also stated that at 6:00 am he notice dbright red blood in his stools. Dr. Maria Shiner informed. Verbal order for Immodium 4 mg oral and a CBC received.

## 2023-07-15 ENCOUNTER — Encounter: Payer: Self-pay | Admitting: Family Medicine

## 2023-07-15 ENCOUNTER — Encounter: Payer: Self-pay | Admitting: Hematology & Oncology

## 2023-07-16 ENCOUNTER — Ambulatory Visit (HOSPITAL_COMMUNITY)

## 2023-07-16 ENCOUNTER — Other Ambulatory Visit (HOSPITAL_COMMUNITY)

## 2023-07-20 ENCOUNTER — Telehealth: Payer: Self-pay | Admitting: *Deleted

## 2023-07-20 ENCOUNTER — Inpatient Hospital Stay

## 2023-07-20 VITALS — BP 111/68 | HR 85 | Temp 98.5°F | Resp 17

## 2023-07-20 DIAGNOSIS — C9110 Chronic lymphocytic leukemia of B-cell type not having achieved remission: Secondary | ICD-10-CM

## 2023-07-20 DIAGNOSIS — Z5112 Encounter for antineoplastic immunotherapy: Secondary | ICD-10-CM | POA: Diagnosis not present

## 2023-07-20 LAB — CBC WITH DIFFERENTIAL (CANCER CENTER ONLY)
Abs Immature Granulocytes: 0.09 10*3/uL — ABNORMAL HIGH (ref 0.00–0.07)
Basophils Absolute: 0.1 10*3/uL (ref 0.0–0.1)
Basophils Relative: 0 %
Eosinophils Absolute: 0 10*3/uL (ref 0.0–0.5)
Eosinophils Relative: 0 %
HCT: 31.7 % — ABNORMAL LOW (ref 39.0–52.0)
Hemoglobin: 9.9 g/dL — ABNORMAL LOW (ref 13.0–17.0)
Immature Granulocytes: 0 %
Lymphocytes Relative: 98 %
Lymphs Abs: 150.1 10*3/uL — ABNORMAL HIGH (ref 0.7–4.0)
MCH: 33.1 pg (ref 26.0–34.0)
MCHC: 31.2 g/dL (ref 30.0–36.0)
MCV: 106 fL — ABNORMAL HIGH (ref 80.0–100.0)
Monocytes Absolute: 1.8 10*3/uL — ABNORMAL HIGH (ref 0.1–1.0)
Monocytes Relative: 1 %
Neutro Abs: 1.5 10*3/uL — ABNORMAL LOW (ref 1.7–7.7)
Neutrophils Relative %: 1 %
Platelet Count: 136 10*3/uL — ABNORMAL LOW (ref 150–400)
RBC: 2.99 MIL/uL — ABNORMAL LOW (ref 4.22–5.81)
RDW: 13.7 % (ref 11.5–15.5)
WBC Count: 153.5 10*3/uL (ref 4.0–10.5)
nRBC: 0 % (ref 0.0–0.2)

## 2023-07-20 LAB — CMP (CANCER CENTER ONLY)
ALT: 17 U/L (ref 0–44)
AST: 30 U/L (ref 15–41)
Albumin: 4.4 g/dL (ref 3.5–5.0)
Alkaline Phosphatase: 60 U/L (ref 38–126)
Anion gap: 14 (ref 5–15)
BUN: 9 mg/dL (ref 8–23)
CO2: 22 mmol/L (ref 22–32)
Calcium: 9.3 mg/dL (ref 8.9–10.3)
Chloride: 95 mmol/L — ABNORMAL LOW (ref 98–111)
Creatinine: 1.2 mg/dL (ref 0.61–1.24)
GFR, Estimated: >60 mL/min (ref >60)
Glucose, Bld: 114 mg/dL — ABNORMAL HIGH (ref 70–99)
Potassium: 4.4 mmol/L (ref 3.5–5.1)
Sodium: 131 mmol/L — ABNORMAL LOW (ref 135–145)
Total Bilirubin: 0.9 mg/dL (ref 0.0–1.2)
Total Protein: 6.5 g/dL (ref 6.5–8.1)

## 2023-07-20 LAB — HEPATITIS B SURFACE ANTIGEN: Hepatitis B Surface Ag: NONREACTIVE

## 2023-07-20 MED ORDER — SODIUM CHLORIDE 0.9 % IV SOLN
20.0000 mg | Freq: Once | INTRAVENOUS | Status: AC
  Administered 2023-07-20: 20 mg via INTRAVENOUS
  Filled 2023-07-20: qty 20

## 2023-07-20 MED ORDER — DIPHENHYDRAMINE HCL 50 MG/ML IJ SOLN
50.0000 mg | Freq: Once | INTRAMUSCULAR | Status: AC
  Administered 2023-07-20: 50 mg via INTRAVENOUS
  Filled 2023-07-20: qty 1

## 2023-07-20 MED ORDER — SODIUM CHLORIDE 0.9% FLUSH: 10.0000 mL | INTRAVENOUS | Status: DC | PRN

## 2023-07-20 MED ORDER — HEPARIN SOD (PORK) LOCK FLUSH 100 UNIT/ML IV SOLN: 250.0000 [IU] | Freq: Once | INTRAVENOUS | Status: DC | PRN

## 2023-07-20 MED ORDER — SODIUM CHLORIDE 0.9 % IV SOLN
INTRAVENOUS | Status: DC
Start: 2023-07-20 — End: 2023-07-20

## 2023-07-20 MED ORDER — SODIUM CHLORIDE 0.9 % IV SOLN
1000.0000 mg | Freq: Once | INTRAVENOUS | Status: AC
  Administered 2023-07-20: 1000 mg via INTRAVENOUS
  Filled 2023-07-20: qty 40

## 2023-07-20 MED ORDER — HEPARIN SOD (PORK) LOCK FLUSH 100 UNIT/ML IV SOLN: 500.0000 [IU] | Freq: Once | INTRAVENOUS | Status: DC | PRN

## 2023-07-20 MED ORDER — ACETAMINOPHEN 325 MG PO TABS
650.0000 mg | ORAL_TABLET | Freq: Once | ORAL | Status: AC
  Administered 2023-07-20: 650 mg via ORAL
  Filled 2023-07-20: qty 2

## 2023-07-20 NOTE — Telephone Encounter (Signed)
 Received call from Dean Every [facility] ED lab, that patient has a critical WBC count of 153.5. Results given to MD.

## 2023-07-20 NOTE — Patient Instructions (Signed)
 [facility] - A DEPT OF [facility]  Discharge Instructions: Thank you for choosing [facility] to provide your oncology and hematology care.   If you have a lab appointment with the Cancer Center, please go directly to the Cancer Center and check in at the registration area.  Wear comfortable clothing and clothing appropriate for easy access to any Portacath or PICC line.   We strive to give you quality time with your provider. You may need to reschedule your appointment if you arrive late (15 or more minutes).  Arriving late affects you and other patients whose appointments are after yours.  Also, if you miss three or more appointments without notifying the office, you may be dismissed from the clinic at the provider's discretion.      For prescription refill requests, have your pharmacy contact our office and allow 72 hours for refills to be completed.    Today you received the following chemotherapy and/or immunotherapy agents Gazyva      To help prevent nausea and vomiting after your treatment, we encourage you to take your nausea medication as directed.  BELOW ARE SYMPTOMS THAT SHOULD BE REPORTED IMMEDIATELY: *FEVER GREATER THAN 100.4 F (38 C) OR HIGHER *CHILLS OR SWEATING *NAUSEA AND VOMITING THAT IS NOT CONTROLLED WITH YOUR NAUSEA MEDICATION *UNUSUAL SHORTNESS OF BREATH *UNUSUAL BRUISING OR BLEEDING *URINARY PROBLEMS (pain or burning when urinating, or frequent urination) *BOWEL PROBLEMS (unusual diarrhea, constipation, pain near the anus) TENDERNESS IN MOUTH AND THROAT WITH OR WITHOUT PRESENCE OF ULCERS (sore throat, sores in mouth, or a toothache) UNUSUAL RASH, SWELLING OR PAIN  UNUSUAL VAGINAL DISCHARGE OR ITCHING   Items with * indicate a potential emergency and should be followed up as soon as possible or go to the Emergency Department if any problems should occur.  Please show the CHEMOTHERAPY ALERT CARD or IMMUNOTHERAPY ALERT  CARD at check-in to the Emergency Department and triage nurse. Should you have questions after your visit or need to cancel or reschedule your appointment, please contact [facility] - A DEPT OF [facility]  [phone] and follow the prompts.  Office hours are 8:00 a.m. to 4:30 p.m. Monday - Friday. Please note that voicemails left after 4:00 p.m. may not be returned until the following business day.  We are closed weekends and major holidays. You have access to a nurse at all times for urgent questions. Please call the main number to the clinic [phone] and follow the prompts.  For any non-urgent questions, you may also contact your provider using MyChart. We now offer e-Visits for anyone 75 and older to request care online for non-urgent symptoms. For details visit [email or web address].   Also download the MyChart app! Go to the app store, search "MyChart", open the app, select , and log in with your MyChart username and password.

## 2023-07-21 ENCOUNTER — Other Ambulatory Visit: Payer: Self-pay | Admitting: Family Medicine

## 2023-07-21 DIAGNOSIS — E782 Mixed hyperlipidemia: Secondary | ICD-10-CM

## 2023-07-22 LAB — HEPATITIS B CORE ANTIBODY, TOTAL: HEP B CORE AB: NEGATIVE

## 2023-07-23 ENCOUNTER — Emergency Department
Admission: EM | Admit: 2023-07-23 | Discharge: 2023-07-23 | Disposition: A | Discharge status: Home / Self Care | Attending: Emergency Medicine | Admitting: Emergency Medicine

## 2023-07-23 ENCOUNTER — Encounter: Payer: Self-pay | Admitting: Emergency Medicine

## 2023-07-23 ENCOUNTER — Emergency Department

## 2023-07-23 ENCOUNTER — Other Ambulatory Visit: Payer: Self-pay

## 2023-07-23 DIAGNOSIS — S81851A Open bite, right lower leg, initial encounter: Secondary | ICD-10-CM | POA: Insufficient documentation

## 2023-07-23 DIAGNOSIS — S61451A Open bite of right hand, initial encounter: Secondary | ICD-10-CM | POA: Insufficient documentation

## 2023-07-23 DIAGNOSIS — Z2914 Encounter for prophylactic rabies immune globin: Secondary | ICD-10-CM | POA: Insufficient documentation

## 2023-07-23 DIAGNOSIS — Z23 Encounter for immunization: Secondary | ICD-10-CM | POA: Insufficient documentation

## 2023-07-23 DIAGNOSIS — W5581XA Bitten by other mammals, initial encounter: Secondary | ICD-10-CM | POA: Diagnosis not present

## 2023-07-23 DIAGNOSIS — S1195XA Open bite of unspecified part of neck, initial encounter: Secondary | ICD-10-CM | POA: Insufficient documentation

## 2023-07-23 DIAGNOSIS — Z203 Contact with and (suspected) exposure to rabies: Secondary | ICD-10-CM | POA: Diagnosis not present

## 2023-07-23 MED ORDER — RABIES VACCINE, PCEC IM SUSR
1.0000 mL | Freq: Once | INTRAMUSCULAR | Status: AC
  Administered 2023-07-23: 1 mL via INTRAMUSCULAR
  Filled 2023-07-23: qty 1

## 2023-07-23 MED ORDER — LIDOCAINE-EPINEPHRINE-TETRACAINE (LET) TOPICAL GEL
3.0000 mL | Freq: Once | TOPICAL | Status: AC
  Administered 2023-07-23: 3 mL via TOPICAL
  Filled 2023-07-23: qty 3

## 2023-07-23 MED ORDER — RABIES IMMUNE GLOBULIN 150 UNIT/ML IM INJ
20.0000 [IU]/kg | INJECTION | Freq: Once | INTRAMUSCULAR | Status: AC
  Administered 2023-07-23: 1425 [IU]
  Filled 2023-07-23: qty 10

## 2023-07-23 MED ORDER — AMOXICILLIN-POT CLAVULANATE 875-125 MG PO TABS
1.0000 | ORAL_TABLET | Freq: Once | ORAL | Status: AC
  Administered 2023-07-23: 1 via ORAL
  Filled 2023-07-23: qty 1

## 2023-07-23 MED ORDER — TETANUS-DIPHTH-ACELL PERTUSSIS 5-2.5-18.5 LF-MCG/0.5 IM SUSY
0.5000 mL | PREFILLED_SYRINGE | Freq: Once | INTRAMUSCULAR | Status: AC
  Administered 2023-07-23: 0.5 mL via INTRAMUSCULAR
  Filled 2023-07-23: qty 0.5

## 2023-07-23 MED ORDER — AMOXICILLIN-POT CLAVULANATE 875-125 MG PO TABS
1.0000 | ORAL_TABLET | Freq: Two times a day (BID) | ORAL | 0 refills | Status: DC
Start: 2023-07-23 — End: 2023-10-06

## 2023-07-23 NOTE — ED Triage Notes (Signed)
 Pt to ED via POV. Pt states that he was attacked by a fox. Pt reports that the fox bit him on the right lower leg, right thumb, and neck. Pts leg is actively bleeding. Bleeding on the neck and thumb is controlled.

## 2023-07-23 NOTE — ED Provider Triage Note (Addendum)
 Emergency Medicine Provider Triage Evaluation Note  George [name] , a 72 y.o. male  was evaluated in triage.  Pt complains of fox bite. He was bit on the neck, right thumb and right lower leg. Not sure when his last tetanus was and has not had the rabies series.  Patient is being treated for CLL.  Review of Systems  Positive: Leg pain Negative:   Physical Exam  There were no vitals taken for this visit. Gen:   Awake, no distress   Resp:  Normal effort  MSK:   Moves extremities without difficulty  Other:  Superficial abrasions to the anterior right neck, right thumb is bandaged, right lower leg with multiple puncture sites and lacerations actively bleeding  See media portion of the chart.  Medical Decision Making  Medically screening exam initiated at 4:39 PM.  Appropriate orders placed.  George [name] was informed that the remainder of the evaluation will be completed by another provider, this initial triage assessment does not replace that evaluation, and the importance of remaining in the ED until their evaluation is complete.     Phyliss Breen, PA-C 07/23/23 1641    Phyliss Breen, PA-C 07/23/23 1642    Phyliss Breen, PA-C 07/23/23 1642

## 2023-07-23 NOTE — ED Provider Notes (Signed)
 [facility] Provider Note    Event Date/Time   First MD Initiated Contact with Patient 07/23/23 1647     (approximate)   History   Fox Bite   HPI  George [name] is a 73 y.o. male history of CLL which is currently being treated with infusions and oral meds presents emergency department after being bit by a fox at his home.  Patient states they have seen the fox around quite often but today he was near the wood [place] and he thought he had stepped on a twig when it was the fox that had bitten his leg, also bit his hand and the side of his neck.  Unsure of his last Tdap.      Physical Exam   Triage Vital Signs: ED Triage Vitals  Encounter Vitals Group     BP --      Systolic BP Percentile --      Diastolic BP Percentile --      Pulse --      Resp --      Temp --      Temp src --      SpO2 --      Weight 07/23/23 1641 158 lb 11.7 oz (72 kg)     Height 07/23/23 1641 5\' 8"  (1.727 m)     Head Circumference --      Peak Flow --      Pain Score 07/23/23 1640 0     Pain Loc --      Pain Education --      Exclude from Growth Chart --     Most recent vital signs: There were no vitals filed for this visit.   General: Awake, no distress.   CV:  Good peripheral perfusion. regular rate and  rhythm Resp:  Normal effort.  Abd:  No distention.   Other:  Right leg with open wound that is actively bleeding, right side of his neck has 2 puncture wounds, right finger with nail avulsion   ED Results / Procedures / Treatments   Labs (all labs ordered are listed, but only abnormal results are displayed) Labs Reviewed - No data to display   EKG     RADIOLOGY X-ray of the right tib-fib    PROCEDURES:   Procedures Chief Complaint  Patient presents with   Francetta Innocent Bite      MEDICATIONS ORDERED IN ED: Medications  amoxicillin -clavulanate (AUGMENTIN ) 875-125 MG per tablet 1 tablet (has no administration in time range)  rabies immune globulin   (HYPERRAB/KEDRAB ) injection 1,425 Units (1,425 Units Infiltration Given 07/23/23 1737)  rabies vaccine  (RABAVERT ) injection 1 mL (1 mL Intramuscular Given 07/23/23 1735)  Tdap (BOOSTRIX) injection 0.5 mL (0.5 mLs Intramuscular Given 07/23/23 1733)  lidocaine -EPINEPHrine -tetracaine  (LET) topical gel (3 mLs Topical Given 07/23/23 1738)     IMPRESSION / MDM / ASSESSMENT AND PLAN / ED COURSE  I reviewed the triage vital signs and the nursing notes.                              Differential diagnosis includes, but is not limited to, fox bite, cellulitis, open wound, wound care  Patient's presentation is most consistent with acute illness / injury with system symptoms.   Due to this being a wild animal that bit his leg especially a fox we will go ahead and treat him for rabies.  Did explain to the patient that generally  a fox will run from a human unless it is rabid.  He should call animal control.  Also Tdap was updated here in the ED.  Is also given Augmentin .  I did check with the pharmacist to make sure there is no interaction between the rabies vaccines and his current CLL regimen.   L ET applied to the lower leg, will wash this area out extensively with Betadine, will topically clean the other 2 wounds  X-ray of the right lower extremity was independently reviewed and interpreted by me by looking at the images as being negative for foreign body or fracture, radiology read pending, confirmed by radiology  Wounds were cleaned with Betadine and saline.  Dressings applied.  Patient was given Augmentin  while here in the ED.  Prescription for Augmentin  sent to the pharmacy.  Follow-up with his doctor on Monday for wound recheck.  Return if worsening.  He and his wife are in agreement treatment plan.  Discharged stable condition.  FINAL CLINICAL IMPRESSION(S) / ED DIAGNOSES   Final diagnoses:  Animal bite of right lower leg, initial encounter  Wound of neck due to animal bite     Rx / DC Orders    ED Discharge Orders          Ordered    amoxicillin -clavulanate (AUGMENTIN ) 875-125 MG tablet  2 times daily        07/23/23 1715             Note:  This document was prepared using Dragon voice recognition software and may include unintentional dictation errors.    Delsie Figures, PA-C 07/23/23 Glorious Larry    Claria Crofts, MD 07/23/23 [phone]

## 2023-07-23 NOTE — Discharge Instructions (Signed)
 Follow up with your doctor on Monday for wound recheck Take the antibiotic as prescribed Repeat rabavert  vaccine need in 3 days, 7 days, and 14 days Dates would be May 4, May 7, and May 14

## 2023-07-26 ENCOUNTER — Encounter: Payer: Self-pay | Admitting: Family Medicine

## 2023-07-26 ENCOUNTER — Encounter: Payer: Self-pay | Admitting: Hematology & Oncology

## 2023-07-27 ENCOUNTER — Ambulatory Visit
Admission: RE | Admit: 2023-07-27 | Discharge: 2023-07-27 | Disposition: A | Discharge status: Home / Self Care | Source: Ambulatory Visit

## 2023-07-27 ENCOUNTER — Inpatient Hospital Stay

## 2023-07-27 ENCOUNTER — Other Ambulatory Visit (HOSPITAL_COMMUNITY): Payer: Self-pay

## 2023-07-27 ENCOUNTER — Telehealth: Payer: Self-pay

## 2023-07-27 ENCOUNTER — Other Ambulatory Visit: Payer: Self-pay

## 2023-07-27 ENCOUNTER — Inpatient Hospital Stay: Attending: Hematology & Oncology

## 2023-07-27 ENCOUNTER — Ambulatory Visit: Payer: Self-pay

## 2023-07-27 VITALS — BP 120/74 | HR 90 | Temp 98.7°F | Resp 18 | Ht 68.0 in | Wt 157.0 lb

## 2023-07-27 DIAGNOSIS — Z5112 Encounter for antineoplastic immunotherapy: Secondary | ICD-10-CM | POA: Diagnosis present

## 2023-07-27 DIAGNOSIS — C9110 Chronic lymphocytic leukemia of B-cell type not having achieved remission: Secondary | ICD-10-CM | POA: Insufficient documentation

## 2023-07-27 DIAGNOSIS — L03115 Cellulitis of right lower limb: Secondary | ICD-10-CM | POA: Diagnosis not present

## 2023-07-27 DIAGNOSIS — Z79620 Long term (current) use of immunosuppressive biologic: Secondary | ICD-10-CM | POA: Diagnosis not present

## 2023-07-27 DIAGNOSIS — W5581XA Bitten by other mammals, initial encounter: Secondary | ICD-10-CM

## 2023-07-27 LAB — CBC WITH DIFFERENTIAL (CANCER CENTER ONLY)
Abs Immature Granulocytes: 0.13 10*3/uL — ABNORMAL HIGH (ref 0.00–0.07)
Basophils Absolute: 0.1 10*3/uL (ref 0.0–0.1)
Basophils Relative: 0 %
Eosinophils Absolute: 0 10*3/uL (ref 0.0–0.5)
Eosinophils Relative: 0 %
HCT: 28.2 % — ABNORMAL LOW (ref 39.0–52.0)
Hemoglobin: 8.9 g/dL — ABNORMAL LOW (ref 13.0–17.0)
Immature Granulocytes: 0 %
Lymphocytes Relative: 97 %
Lymphs Abs: 143.8 10*3/uL — ABNORMAL HIGH (ref 0.7–4.0)
MCH: 33.8 pg (ref 26.0–34.0)
MCHC: 31.6 g/dL (ref 30.0–36.0)
MCV: 107.2 fL — ABNORMAL HIGH (ref 80.0–100.0)
Monocytes Absolute: 1 10*3/uL (ref 0.1–1.0)
Monocytes Relative: 1 %
Neutro Abs: 2.8 10*3/uL (ref 1.7–7.7)
Neutrophils Relative %: 2 %
Platelet Count: 188 10*3/uL (ref 150–400)
RBC: 2.63 MIL/uL — ABNORMAL LOW (ref 4.22–5.81)
RDW: 15 % (ref 11.5–15.5)
Smear Review: NORMAL
WBC Count: 147.7 10*3/uL (ref 4.0–10.5)
nRBC: 0 % (ref 0.0–0.2)

## 2023-07-27 LAB — CMP (CANCER CENTER ONLY)
ALT: 10 U/L (ref 0–44)
AST: 17 U/L (ref 15–41)
Albumin: 4.3 g/dL (ref 3.5–5.0)
Alkaline Phosphatase: 53 U/L (ref 38–126)
Anion gap: 9 (ref 5–15)
BUN: 9 mg/dL (ref 8–23)
CO2: 25 mmol/L (ref 22–32)
Calcium: 8.9 mg/dL (ref 8.9–10.3)
Chloride: 98 mmol/L (ref 98–111)
Creatinine: 0.69 mg/dL (ref 0.61–1.24)
GFR, Estimated: >60 mL/min (ref >60)
Glucose, Bld: 85 mg/dL (ref 70–99)
Potassium: 4.1 mmol/L (ref 3.5–5.1)
Sodium: 132 mmol/L — ABNORMAL LOW (ref 135–145)
Total Bilirubin: 0.6 mg/dL (ref 0.0–1.2)
Total Protein: 6.4 g/dL — ABNORMAL LOW (ref 6.5–8.1)

## 2023-07-27 MED ORDER — SULFAMETHOXAZOLE-TRIMETHOPRIM 800-160 MG PO TABS
1.0000 | ORAL_TABLET | Freq: Two times a day (BID) | ORAL | 0 refills | Status: AC
  Filled 2023-07-27 (×2): qty 14, 7d supply, fill #0

## 2023-07-27 MED ORDER — RABIES VACCINE, PCEC IM SUSR
1.0000 mL | Freq: Once | INTRAMUSCULAR | Status: AC
  Administered 2023-07-27: 1 mL via INTRAMUSCULAR

## 2023-07-27 NOTE — Progress Notes (Signed)
 0900  patient here for Gazyva . States he was attacked by a fox. 2 quarter size bruised areas on front of his neck, and right lower leg bandaged. Patient's hands shaking. Dr. Maria Shiner notified. Will hold Gazyva  today.

## 2023-07-27 NOTE — ED Triage Notes (Signed)
 07/23/23 Fox bite; needing follow up 2nd  vaccine and rabies immune globulin  - Entered by patient  After discussing with patient, only needs Rabies (#2) and Wound check. No fever.

## 2023-07-27 NOTE — Telephone Encounter (Signed)
 Critical WBC of 147.7 received from lab. MD notified

## 2023-07-27 NOTE — ED Provider Notes (Signed)
 [facility]    CSN: [id number] Arrival date & time: 07/27/23  1446      History   Chief Complaint Chief Complaint  Patient presents with   Wound Check   Rabies Vaccine  (#2)    HPI George [name] is a 73 y.o. male.   Patient here today for evaluation of continued redness and swelling around area fox bite that occurred 4 days ago.  He reports he also needs his second rabies vaccination.  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  He does not report any fever, nausea or vomiting. He has been taking augmentin  as prescribed.  The history is provided by the patient.  Wound Check Pertinent negatives include no shortness of breath.    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   Vitamin D  insufficiency 04/13/2019   Mildly Elevated AST (SGOT) 09/09/2017   Essential hypertension 09/09/2017   Tremor of right hand 04/17/2014   Hyperlipidemia, mixed 04/21/2013   CLL (chronic lymphocytic leukemia) (HCC) 10/14/2011   GERD (gastroesophageal reflux disease) 10/22/2010    Past Surgical History:  Procedure Laterality Date   COLONOSCOPY     ESOPHAGEAL DILATION  03/25/2003   [facility]   HERNIA REPAIR  1972   right groin   IR BONE MARROW BIOPSY & ASPIRATION  06/24/2023   POLYPECTOMY         Home Medications    Prior to  Admission medications   Medication Sig Start Date End Date Taking? Authorizing Provider  allopurinol  (ZYLOPRIM ) 100 MG tablet Take 1 tablet (100 mg total) by mouth daily. 06/15/23  Yes Ivor Mars, MD  amoxicillin -clavulanate (AUGMENTIN ) 875-125 MG tablet Take 1 tablet by mouth 2 (two) times daily. 07/23/23  Yes Fisher, Rufino Coulter, PA-C  cholecalciferol (VITAMIN D ) 1000 UNITS tablet Take 1,000 Units by mouth daily. 2 tablets daily   Yes [provider]  Coenzyme Q10 (CO Q 10 PO) Take by mouth every morning.    Yes [provider]  esomeprazole  (NEXIUM ) 20 MG capsule Take 1 capsule (20 mg total) by mouth daily. 02/10/23  Yes Maryclare Smoke A, PA  famciclovir  (FAMVIR ) 500 MG tablet Take 1 tablet (500 mg total) by mouth daily. 06/15/23  Yes Ennever, Sherryll Donald, MD  fluorouracil  (EFUDEX ) 5 % cream Apply topically. 08/28/21  Yes [provider]  GLUCOSAMINE HCL PO Take by mouth every morning.    Yes [provider]  losartan  (COZAAR ) 50 MG tablet Take 1 tablet (50 mg total) by mouth daily. 02/10/23  Yes Maryclare Smoke A, PA  rosuvastatin  (CRESTOR ) 40 MG tablet Take 1 tablet (40 mg total) by mouth daily. 02/10/23  Yes Maryclare Smoke A, PA  sulfamethoxazole-trimethoprim (BACTRIM DS) 800-160 MG tablet Take 1 tablet by mouth 2 (two) times daily for 7 days.  07/27/23 08/03/23 Yes Vernestine Gondola, PA-C  zanubrutinib  (BRUKINSA ) 80 MG capsule Take 2 capsules (160 mg total) by mouth 2 (two) times daily. 06/15/23  Yes Ivor Mars, MD  OVER THE COUNTER MEDICATION 2 (two) times daily. CLEAR- One spray BID.    [provider]    Family History Family History  Problem Relation Age of Onset   Hyperlipidemia Mother    Hypertension Mother        mother-father   Pulmonary fibrosis Mother    Diabetes Father    Heart disease Father        congestive heart failure   Other Father        brain tumor   Mental illness Sister        depression   GER disease Son    Stroke  Maternal Grandmother    Diabetes Maternal Grandmother    Diabetes Other        mother -father   Prostate cancer Neg Hx    Breast cancer Neg Hx    Colon cancer Neg Hx     Social History Social History   Tobacco Use   Smoking status: Former    Current packs/day: 0.00    Average packs/day: 1 pack/day for 22.1 years (22.1 ttl pk-yrs)    Types: Cigarettes    Start date: 02/05/1970    Quit date: 03/07/1992    Years since quitting: 31.4    Passive exposure: Never   Smokeless tobacco: Never   Tobacco comments:    quit 21 years ago  Vaping Use   Vaping status: Never Used  Substance Use Topics   Alcohol use: Yes    Alcohol/week: 7.0 standard drinks of alcohol    Types: 7 Cans of beer per week    Comment: was 5 beers/day now 1 beer/day   Drug use: No     Allergies   Doxycycline    Review of Systems Review of Systems  Constitutional:  Negative for chills and fever.  Eyes:  Negative for discharge and redness.  Respiratory:  Negative for shortness of breath.   Skin:  Positive for color change and wound.  Neurological:  Negative for numbness.     Physical Exam Triage Vital Signs ED Triage Vitals  Encounter Vitals Group     BP 07/27/23 1502 120/74     Systolic BP Percentile --      Diastolic BP Percentile --      Pulse Rate 07/27/23 1502 90     Resp 07/27/23 1502 18     Temp 07/27/23 1502 98.7 F (37.1 C)     Temp Source 07/27/23 1502 Oral     SpO2 07/27/23 1502 96 %     Weight 07/27/23 1459 157 lb (71.2 kg)     Height 07/27/23 1459 5\' 8"  (1.727 m)     Head Circumference --      Peak Flow --      Pain Score 07/27/23 1459 0     Pain Loc --      Pain Education --      Exclude from Growth Chart --    No data found.  Updated Vital Signs BP 120/74 (BP Location: Left Arm)   Pulse 90   Temp 98.7 F (37.1 C) (Oral)   Resp 18   Ht 5\' 8"  (1.727 m)   Wt 157 lb (71.2 kg)   SpO2 96%   BMI 23.87 kg/m   Visual Acuity Right Eye Distance:   Left  Eye Distance:    Bilateral Distance:    Right Eye Near:   Left Eye Near:    Bilateral Near:     Physical Exam Vitals and nursing note reviewed.  Constitutional:      General: He is not in acute distress.    Appearance: Normal appearance. He is not ill-appearing.  HENT:     Head: Normocephalic and atraumatic.  Eyes:     Conjunctiva/sclera: Conjunctivae normal.  Cardiovascular:     Rate and Rhythm: Normal rate.  Pulmonary:     Effort: Pulmonary effort is normal. No respiratory distress.  Musculoskeletal:     Comments: Full range of motion of right toes.  Mildly decreased range of motion of right ankle due to swelling and stiffness and right lower leg  Skin:    Capillary Refill: Normal cap refill to right toes    Comments: Multiple wounds appreciated to right lower leg, anterior neck area without bleeding.  Wound to lateral right lower leg slightly deeper with some oozing but no bleeding.  Erythema surrounding wounds, worse to right lower leg  Neurological:     Mental Status: He is alert.     Comments: Gross sensation intact to right toes distally  Psychiatric:        Mood and Affect: Mood normal.        Behavior: Behavior normal.        Thought Content: Thought content normal.      UC Treatments / Results  Labs (all labs ordered are listed, but only abnormal results are displayed) Labs Reviewed - No data to display  EKG   Radiology No results found.  Procedures Procedures (including critical care time)  Medications Ordered in UC Medications  rabies vaccine  (RABAVERT ) injection 1 mL (1 mL Intramuscular Given 07/27/23 1529)    Initial Impression / Assessment and Plan / UC Course  I have reviewed the triage vital signs and the nursing notes.  Pertinent labs & imaging results that were available during my care of the patient were reviewed by me and considered in my medical decision making (see chart for details).   Rabies vaccination administered in office and will add Bactrim to  Augmentin  for additional antibiotic coverage given CLL.  Advised to continue to monitor and follow-up with any concerns otherwise will return for next scheduled rabies vaccination.   Final Clinical Impressions(s) / UC Diagnoses   Final diagnoses:  Cellulitis of right lower extremity  CLL (chronic lymphocytic leukemia) (HCC)  Bite wound from mammal   Discharge Instructions   None    ED Prescriptions     Medication Sig Dispense Auth. Provider   sulfamethoxazole-trimethoprim (BACTRIM DS) 800-160 MG tablet Take 1 tablet by mouth 2 (two) times daily for 7 days. 14 tablet Vernestine Gondola, PA-C      PDMP not reviewed this encounter.   Vernestine Gondola, PA-C 07/27/23 [phone]

## 2023-07-27 NOTE — Telephone Encounter (Signed)
 Copied from CRM [id number]. Topic: Clinical - Red Word Triage >> Jul 27, 2023  9:49 AM DeAngela L wrote: Red Word that prompted transfer to Nurse Triage: Patient was bitten by a fox and went to ER had multiple rabies shots and needs a f/u soon as possible, Pt had a cancer treatment cancelled this morning after they found out he was bit by a fox  Patient George [name] [id number]    Chief Complaint: Bitten by a fox 07/23/23 and seen in ED. Office states they cannot do follow up care on rabies series. Will need to return to ED.  Symptoms: Above Frequency: 07/23/23 Pertinent Negatives: Patient denies  Disposition: [x] ED /[x] Urgent Care (no appt availability in office) / [] Appointment(In office/virtual)/ []  [facility]/ [] Home Care/ [] Refused Recommended Disposition /[] [facility]/ []  Follow-up with PCP Additional Notes: Wife verbalizes understanding/.  Reason for Disposition  [1] Last tetanus shot > 5 years ago AND [2] any wound (e.g., cut, scrape)  Answer Assessment - Initial Assessment Questions 1. ANIMAL: "What type of animal caused the bite?" "Is the injury from a bite or a claw?" If the animal is a dog or a cat, ask: "Was it a pet or a stray?" "Was it acting ill or behaving strangely?"     Fox 2. LOCATION: "Where is the bite located?"      Neck, leg, right thumb 3. SIZE: "How big is the bite?" "What does it look like?"      Small to moderate 4. ONSET: "When did the bite happen?" (Minutes or hours ago)      07/23/23 5. CIRCUMSTANCES: "Tell me how this happened."      Fox 6. TETANUS: "When was your last tetanus booster?"     In ED 7. RABIES VACCINE : For dog or cat bites, ask: "Do you know if the pet is vaccinated against rabies?"  (e.g., yes, no, overdue for rabies shot, unknown)     N/A 8. PREGNANCY: "Is there any chance you are pregnant?" "When was your last menstrual period?"     N/A  Protocols used: Animal Bite-A-AH

## 2023-07-28 ENCOUNTER — Other Ambulatory Visit (HOSPITAL_COMMUNITY): Payer: Self-pay

## 2023-07-30 ENCOUNTER — Other Ambulatory Visit: Payer: Self-pay

## 2023-07-30 ENCOUNTER — Ambulatory Visit
Admission: RE | Admit: 2023-07-30 | Discharge: 2023-07-30 | Disposition: A | Discharge status: Home / Self Care | Source: Ambulatory Visit | Attending: Family Medicine | Admitting: Family Medicine

## 2023-07-30 VITALS — BP 136/90 | HR 69 | Temp 98.0°F | Resp 18 | Wt 157.0 lb

## 2023-07-30 DIAGNOSIS — Z203 Contact with and (suspected) exposure to rabies: Secondary | ICD-10-CM | POA: Diagnosis not present

## 2023-07-30 DIAGNOSIS — Z5189 Encounter for other specified aftercare: Secondary | ICD-10-CM | POA: Diagnosis not present

## 2023-07-30 DIAGNOSIS — Z23 Encounter for immunization: Secondary | ICD-10-CM

## 2023-07-30 MED ORDER — RABIES VACCINE, PCEC IM SUSR
1.0000 mL | Freq: Once | INTRAMUSCULAR | Status: AC
  Administered 2023-07-30: 1 mL via INTRAMUSCULAR

## 2023-07-30 NOTE — ED Triage Notes (Signed)
 Pt presents needing another series of Rabies Vaccine . He states his 2nd round was on Monday. Pt is also requesting a wound check. He states there is a little swelling and the area is still tender.

## 2023-07-30 NOTE — ED Provider Notes (Signed)
 [facility]   [id number] 07/30/23 Arrival Time: 0840  ASSESSMENT & PLAN:  1. Visit for wound check   2. Encounter for repeat administration of rabies vaccination    RLE wound is looking good. No indication for further antibiotic tx. Penultimate rabies vaccine  given.  OTC symptom care as needed.  Meds ordered this encounter  Medications   rabies vaccine  (RABAVERT ) injection 1 mL     Follow-up Information     [facility] at [facility]).   Specialty: Urgent Care Why: For your final rabies shot. Contact information: [address] [place]  [address] [phone]                Reviewed expectations re: course of current medical issues. Questions answered. Outlined signs and symptoms indicating need for more acute intervention. Understanding verbalized. After Visit Summary given.   SUBJECTIVE: History from: Patient. George [name] is a 73 y.o. male. Pt presents needing another series of Rabies Vaccine . He states his 2nd round was on Monday. Pt is also requesting a wound check. He states there is a little swelling and the area is still tender.  Has finished antibiotic. Denies fever.  OBJECTIVE:  Vitals:   07/30/23 0858 07/30/23 0859  BP:  (!) 136/90  Pulse:  69  Resp:  18  Temp:  98 F (36.7 C)  TempSrc:  Oral  SpO2:  97%  Weight: 71.2 kg     General appearance: alert; no distress Skin: warm and dry; healing bite wound over RLE just above ankle; cool to touch; without drainage Neurologic: normal gait Psychological: alert and cooperative; normal mood and affect    Allergies  Allergen Reactions   Doxycycline  Other (See Comments)    Sore throat, red tongue, red skin  Patient states that he was also on a camping trip when the symptoms developed, so he is unsure if they were due to the doxycycline  or something that he encountered the mountains.    Past Medical History:  Diagnosis Date    CLL (chronic lymphocytic leukemia) (HCC) 03/25/2007   Colon polyp    Elevated BP 04/23/2014   GERD (gastroesophageal reflux disease)    High cholesterol    History of chicken pox    childhood   Preventative health care 10/23/2013   Tremor of right hand 04/17/2014   and left hand   Social History   Socioeconomic History   Marital status: Married    Spouse name: Fredrik Jensen   Number of children: 1   Years of education: Not on file   Highest education level: Not on file  Occupational History   Not on file  Tobacco Use   Smoking status: Former    Current packs/day: 0.00    Average packs/day: 1 pack/day for 22.1 years (22.1 ttl pk-yrs)    Types: Cigarettes    Start date: 02/05/1970    Quit date: 03/07/1992    Years since quitting: 31.4    Passive exposure: Never   Smokeless tobacco: Never   Tobacco comments:    quit 21 years ago  Vaping Use   Vaping status: Never Used  Substance and Sexual Activity   Alcohol use: Yes    Alcohol/week: 7.0 standard drinks of alcohol    Types: 7 Cans of beer per week    Comment: was 5 beers/day now 1 beer/day   Drug use: No   Sexual activity: Yes    Comment: lives with wife, no dietary restirctions  Other Topics Concern   Not on file  Social History Narrative   Lives at home with wife   Right handed   Caffeine: none   Social Drivers of Corporate investment banker Strain: Not on file  Food Insecurity: Not on file  Transportation Needs: Not on file  Physical Activity: Not on file  Stress: Not on file  Social Connections: Not on file  Intimate Partner Violence: Not on file   Family History  Problem Relation Age of Onset   Hyperlipidemia Mother    Hypertension Mother        mother-father   Pulmonary fibrosis Mother    Diabetes Father    Heart disease Father        congestive heart failure   Other Father        brain tumor   Mental illness Sister        depression   GER disease Son    Stroke Maternal Grandmother    Diabetes  Maternal Grandmother    Diabetes Other        mother -father   Prostate cancer Neg Hx    Breast cancer Neg Hx    Colon cancer Neg Hx    Past Surgical History:  Procedure Laterality Date   COLONOSCOPY     ESOPHAGEAL DILATION  03/25/2003   [facility]   HERNIA REPAIR  1972   right groin   IR BONE MARROW BIOPSY & ASPIRATION  06/24/2023   POLYPECTOMY       Afton Albright, MD 07/30/23 1055

## 2023-08-03 ENCOUNTER — Other Ambulatory Visit: Payer: Self-pay

## 2023-08-03 ENCOUNTER — Inpatient Hospital Stay

## 2023-08-03 ENCOUNTER — Other Ambulatory Visit (HOSPITAL_COMMUNITY): Payer: Self-pay

## 2023-08-03 ENCOUNTER — Telehealth: Payer: Self-pay

## 2023-08-03 DIAGNOSIS — Z5112 Encounter for antineoplastic immunotherapy: Secondary | ICD-10-CM | POA: Diagnosis not present

## 2023-08-03 DIAGNOSIS — C9110 Chronic lymphocytic leukemia of B-cell type not having achieved remission: Secondary | ICD-10-CM

## 2023-08-03 LAB — CBC WITH DIFFERENTIAL (CANCER CENTER ONLY)
Abs Immature Granulocytes: 0.09 10*3/uL — ABNORMAL HIGH (ref 0.00–0.07)
Basophils Absolute: 0.1 10*3/uL (ref 0.0–0.1)
Basophils Relative: 0 %
Eosinophils Absolute: 0 10*3/uL (ref 0.0–0.5)
Eosinophils Relative: 0 %
HCT: 30.2 % — ABNORMAL LOW (ref 39.0–52.0)
Hemoglobin: 9.4 g/dL — ABNORMAL LOW (ref 13.0–17.0)
Immature Granulocytes: 0 %
Lymphocytes Relative: 97 %
Lymphs Abs: 122.8 10*3/uL — ABNORMAL HIGH (ref 0.7–4.0)
MCH: 33.9 pg (ref 26.0–34.0)
MCHC: 31.1 g/dL (ref 30.0–36.0)
MCV: 109 fL — ABNORMAL HIGH (ref 80.0–100.0)
Monocytes Absolute: 1.3 10*3/uL — ABNORMAL HIGH (ref 0.1–1.0)
Monocytes Relative: 1 %
Neutro Abs: 2.3 10*3/uL (ref 1.7–7.7)
Neutrophils Relative %: 2 %
Platelet Count: 149 10*3/uL — ABNORMAL LOW (ref 150–400)
RBC: 2.77 MIL/uL — ABNORMAL LOW (ref 4.22–5.81)
RDW: 15.2 % (ref 11.5–15.5)
WBC Count: 126.6 10*3/uL (ref 4.0–10.5)
nRBC: 0 % (ref 0.0–0.2)

## 2023-08-03 LAB — CMP (CANCER CENTER ONLY)
ALT: 11 U/L (ref 0–44)
AST: 20 U/L (ref 15–41)
Albumin: 4.6 g/dL (ref 3.5–5.0)
Alkaline Phosphatase: 64 U/L (ref 38–126)
Anion gap: 8 (ref 5–15)
BUN: 8 mg/dL (ref 8–23)
CO2: 24 mmol/L (ref 22–32)
Calcium: 8.7 mg/dL — ABNORMAL LOW (ref 8.9–10.3)
Chloride: 95 mmol/L — ABNORMAL LOW (ref 98–111)
Creatinine: 0.77 mg/dL (ref 0.61–1.24)
GFR, Estimated: >60 mL/min (ref >60)
Glucose, Bld: 95 mg/dL (ref 70–99)
Potassium: 4.4 mmol/L (ref 3.5–5.1)
Sodium: 127 mmol/L — ABNORMAL LOW (ref 135–145)
Total Bilirubin: 0.8 mg/dL (ref 0.0–1.2)
Total Protein: 6.5 g/dL (ref 6.5–8.1)

## 2023-08-03 NOTE — Telephone Encounter (Signed)
 Received a critical lab result from [facility] in ED lab. WBC count was 126.6 today. Dr Maria Shiner made aware and is happy this has decreased from previous result 147.7. No changes made at this time.

## 2023-08-03 NOTE — Progress Notes (Signed)
 Specialty Pharmacy Ongoing Clinical Assessment Note  George [name] is a 73 y.o. male who is being followed by the specialty pharmacy service for RxSp Oncology   Patient's specialty medication(s) reviewed today: Zanubrutinib  (Brukinsa )   Missed doses in the last 4 weeks: 0   Patient/Caregiver did not have any additional questions or concerns.   Therapeutic benefit summary: Unable to assess   Adverse events/side effects summary: No adverse events/side effects   Patient's therapy is appropriate to: Continue    Goals Addressed             This Visit's Progress    Slow Disease Progression       Patient is unable to be assessed as therapy was recently initiated. Patient will maintain adherence          Follow up: 3 months  Lucette Kratz M Joselinne Lawal Specialty Pharmacist

## 2023-08-03 NOTE — Progress Notes (Signed)
 Specialty Pharmacy Refill Coordination Note  George [name] is a 73 y.o. male contacted today regarding refills of specialty medication(s) Zanubrutinib  (Brukinsa )   Patient requested Delivery   Delivery date: 08/05/23   Verified address: [address] [place] [address]   Medication will be filled on 08/04/23.

## 2023-08-04 ENCOUNTER — Other Ambulatory Visit: Payer: Self-pay

## 2023-08-04 ENCOUNTER — Other Ambulatory Visit: Payer: PPO

## 2023-08-06 ENCOUNTER — Ambulatory Visit
Admission: RE | Admit: 2023-08-06 | Discharge: 2023-08-06 | Disposition: A | Discharge status: Home / Self Care | Source: Ambulatory Visit

## 2023-08-06 DIAGNOSIS — L821 Other seborrheic keratosis: Secondary | ICD-10-CM | POA: Insufficient documentation

## 2023-08-06 DIAGNOSIS — L570 Actinic keratosis: Secondary | ICD-10-CM | POA: Insufficient documentation

## 2023-08-06 DIAGNOSIS — D225 Melanocytic nevi of trunk: Secondary | ICD-10-CM | POA: Insufficient documentation

## 2023-08-06 DIAGNOSIS — Z203 Contact with and (suspected) exposure to rabies: Secondary | ICD-10-CM | POA: Diagnosis not present

## 2023-08-06 DIAGNOSIS — Z85828 Personal history of other malignant neoplasm of skin: Secondary | ICD-10-CM | POA: Insufficient documentation

## 2023-08-06 MED ORDER — RABIES VACCINE, PCEC IM SUSR
1.0000 mL | Freq: Once | INTRAMUSCULAR | Status: AC
  Administered 2023-08-06: 1 mL via INTRAMUSCULAR

## 2023-08-06 NOTE — ED Triage Notes (Signed)
 No Concerns. Rabies Vaccine  given.

## 2023-08-10 ENCOUNTER — Inpatient Hospital Stay

## 2023-08-10 ENCOUNTER — Encounter: Payer: Self-pay | Admitting: Family

## 2023-08-10 ENCOUNTER — Inpatient Hospital Stay (HOSPITAL_BASED_OUTPATIENT_CLINIC_OR_DEPARTMENT_OTHER): Admitting: Family

## 2023-08-10 VITALS — BP 122/81 | HR 79 | Temp 98.3°F | Resp 20

## 2023-08-10 VITALS — BP 142/76 | HR 69 | Temp 98.2°F | Resp 17 | Wt 156.8 lb

## 2023-08-10 DIAGNOSIS — C9110 Chronic lymphocytic leukemia of B-cell type not having achieved remission: Secondary | ICD-10-CM | POA: Diagnosis not present

## 2023-08-10 DIAGNOSIS — Z5112 Encounter for antineoplastic immunotherapy: Secondary | ICD-10-CM | POA: Diagnosis not present

## 2023-08-10 LAB — CMP (CANCER CENTER ONLY)
ALT: 19 U/L (ref 0–44)
AST: 35 U/L (ref 15–41)
Albumin: 4.7 g/dL (ref 3.5–5.0)
Alkaline Phosphatase: 57 U/L (ref 38–126)
Anion gap: 7 (ref 5–15)
BUN: 8 mg/dL (ref 8–23)
CO2: 30 mmol/L (ref 22–32)
Calcium: 9.6 mg/dL (ref 8.9–10.3)
Chloride: 93 mmol/L — ABNORMAL LOW (ref 98–111)
Creatinine: 0.66 mg/dL (ref 0.61–1.24)
GFR, Estimated: >60 mL/min (ref >60)
Glucose, Bld: 119 mg/dL — ABNORMAL HIGH (ref 70–99)
Potassium: 4.7 mmol/L (ref 3.5–5.1)
Sodium: 130 mmol/L — ABNORMAL LOW (ref 135–145)
Total Bilirubin: 1.1 mg/dL (ref 0.0–1.2)
Total Protein: 6.6 g/dL (ref 6.5–8.1)

## 2023-08-10 LAB — CBC WITH DIFFERENTIAL (CANCER CENTER ONLY)
Abs Immature Granulocytes: 0.09 10*3/uL — ABNORMAL HIGH (ref 0.00–0.07)
Basophils Absolute: 0 10*3/uL (ref 0.0–0.1)
Basophils Relative: 0 %
Eosinophils Absolute: 0 10*3/uL (ref 0.0–0.5)
Eosinophils Relative: 0 %
HCT: 31.8 % — ABNORMAL LOW (ref 39.0–52.0)
Hemoglobin: 9.9 g/dL — ABNORMAL LOW (ref 13.0–17.0)
Immature Granulocytes: 0 %
Lymphocytes Relative: 98 %
Lymphs Abs: 121.7 10*3/uL — ABNORMAL HIGH (ref 0.7–4.0)
MCH: 33.4 pg (ref 26.0–34.0)
MCHC: 31.1 g/dL (ref 30.0–36.0)
MCV: 107.4 fL — ABNORMAL HIGH (ref 80.0–100.0)
Monocytes Absolute: 0.2 10*3/uL (ref 0.1–1.0)
Monocytes Relative: 0 %
Neutro Abs: 2.6 10*3/uL (ref 1.7–7.7)
Neutrophils Relative %: 2 %
Platelet Count: 123 10*3/uL — ABNORMAL LOW (ref 150–400)
RBC: 2.96 MIL/uL — ABNORMAL LOW (ref 4.22–5.81)
RDW: 15.4 % (ref 11.5–15.5)
Smear Review: NORMAL
WBC Count: 124.6 10*3/uL (ref 4.0–10.5)
nRBC: 0 % (ref 0.0–0.2)

## 2023-08-10 MED ORDER — ACETAMINOPHEN 325 MG PO TABS
650.0000 mg | ORAL_TABLET | Freq: Once | ORAL | Status: AC
  Administered 2023-08-10: 650 mg via ORAL
  Filled 2023-08-10: qty 2

## 2023-08-10 MED ORDER — DIPHENHYDRAMINE HCL 50 MG/ML IJ SOLN
50.0000 mg | Freq: Once | INTRAMUSCULAR | Status: AC
  Administered 2023-08-10: 50 mg via INTRAVENOUS
  Filled 2023-08-10: qty 1

## 2023-08-10 MED ORDER — SODIUM CHLORIDE 0.9% FLUSH: 10.0000 mL | INTRAVENOUS | Status: DC | PRN

## 2023-08-10 MED ORDER — HEPARIN SOD (PORK) LOCK FLUSH 100 UNIT/ML IV SOLN: 500.0000 [IU] | Freq: Once | INTRAVENOUS | Status: DC | PRN

## 2023-08-10 MED ORDER — SODIUM CHLORIDE 0.9 % IV SOLN
1000.0000 mg | Freq: Once | INTRAVENOUS | Status: AC
  Administered 2023-08-10: 1000 mg via INTRAVENOUS
  Filled 2023-08-10: qty 40

## 2023-08-10 MED ORDER — DEXAMETHASONE SODIUM PHOSPHATE 100 MG/10ML IJ SOLN
20.0000 mg | Freq: Once | INTRAMUSCULAR | Status: AC
  Administered 2023-08-10: 20 mg via INTRAVENOUS
  Filled 2023-08-10: qty 20

## 2023-08-10 MED ORDER — SODIUM CHLORIDE 0.9 % IV SOLN: INTRAVENOUS | Status: DC

## 2023-08-10 NOTE — Progress Notes (Signed)
 Cancel Cycle 1, Day 15 per Provider.  Today is Cycle 2 - Day 1.  George [name], [place], BCPS, BCOP 08/10/23 10:59 AM

## 2023-08-10 NOTE — Progress Notes (Signed)
 Reviewed CBC and CMET, VO "ok to proceed w/treatment"

## 2023-08-10 NOTE — Progress Notes (Signed)
 Critical lab received from [facility] in ED lab. WBC count was 124.6 today. Sarah notified and will speak about this further with pt in clinic today.

## 2023-08-10 NOTE — Progress Notes (Signed)
 Hematology and Oncology Follow Up Visit  George [name] [id number] Mar 19, 1951 73 y.o. 08/10/2023   Principle Diagnosis:  CLL-stage C   Current Therapy:        Gazyva /Brukinsa /venetoclax -- start cycle 1 on 07/14/2023, s/p cycle 1                                 Interim History:  Mr. George [name] is here today for follow-up and treatment. He is doing well recuperating from his crazy fox attack 2 weeks ago.  WBC count today is 124, Hgb 9.9, platelets 123.  His wounds are healing nicely on the right ankle and neck.  No abnormal bruising, no petechiae.  He has not noted any blood loss.  No fever, chills, n/v, cough, rash, dizziness, SOB, chest pain, palpitations, abdominal pain or changes in bowel or bladder habits.  He has been drinking sports drinks that do not contain sugar to help increase his hydration as well as his sodium count. Na today is 130. No numbness or tingling in his extremities at this time.  No falls or syncope.  Appetite is good. Weight is stable at 233 lbs.    ECOG Performance Status: 1 - Symptomatic but completely ambulatory  Medications:  Allergies as of 08/10/2023       Reactions   Doxycycline  Other (See Comments)   Sore throat, red tongue, red skin Patient states that he was also on a camping trip when the symptoms developed, so he is unsure if they were due to the doxycycline  or something that he encountered the mountains.        Medication List        Accurate as of Aug 10, 2023  9:41 AM. If you have any questions, ask your nurse or doctor.          allopurinol  100 MG tablet Commonly known as: Zyloprim  Take 1 tablet (100 mg total) by mouth daily.   amoxicillin -clavulanate 875-125 MG tablet Commonly known as: AUGMENTIN  Take 1 tablet by mouth 2 (two) times daily.   Brukinsa  80 MG capsule Generic drug: zanubrutinib  Take 2 capsules (160 mg total) by mouth 2 (two) times daily.   cholecalciferol 1000 units tablet Commonly known as: VITAMIN D  Take  1,000 Units by mouth daily. 2 tablets daily   CO Q 10 PO Take by mouth every morning.   esomeprazole  20 MG capsule Commonly known as: NEXIUM  Take 1 capsule (20 mg total) by mouth daily.   famciclovir  500 MG tablet Commonly known as: FAMVIR  Take 1 tablet (500 mg total) by mouth daily.   fluorouracil  5 % cream Commonly known as: EFUDEX  Apply topically.   fluorouracil  5 % cream Commonly known as: EFUDEX  Apply 1 Application topically daily.   GLUCOSAMINE HCL PO Take by mouth every morning.   losartan  50 MG tablet Commonly known as: COZAAR  Take 1 tablet (50 mg total) by mouth daily.   OVER THE COUNTER MEDICATION 2 (two) times daily. CLEAR- One spray BID.   rosuvastatin  40 MG tablet Commonly known as: CRESTOR  Take 1 tablet (40 mg total) by mouth daily.   UNABLE TO FIND SPF 30+ CREAM/LOTION        Allergies:  Allergies  Allergen Reactions   Doxycycline  Other (See Comments)    Sore throat, red tongue, red skin  Patient states that he was also on a camping trip when the symptoms developed, so he is unsure if they were due  to the doxycycline  or something that he encountered the mountains.    Past Medical History, Surgical history, Social history, and Family History were reviewed and updated.  Review of Systems: All other 10 point review of systems is negative.   Physical Exam:  weight is 156 lb 12.8 oz (71.1 kg). His oral temperature is 98.2 F (36.8 C). His blood pressure is 142/76 (abnormal) and his pulse is 69. His respiration is 17 and oxygen saturation is 100%.   Wt Readings from Last 3 Encounters:  08/10/23 156 lb 12.8 oz (71.1 kg)  08/06/23 156 lb 15.5 oz (71.2 kg)  07/30/23 156 lb 15.5 oz (71.2 kg)    Ocular: Sclerae unicteric, pupils equal, round and reactive to light Ear-nose-throat: Oropharynx clear, dentition fair Lymphatic: No cervical or supraclavicular adenopathy Lungs no rales or rhonchi, good excursion bilaterally Heart regular rate and  rhythm, no murmur appreciated Abd soft, nontender, positive bowel sounds MSK no focal spinal tenderness, no joint edema Neuro: non-focal, well-oriented, appropriate affect Breasts: Deferred   Lab Results  Component Value Date   WBC PENDING 08/10/2023   HGB 9.9 (L) 08/10/2023   HCT 31.8 (L) 08/10/2023   MCV 107.4 (H) 08/10/2023   PLT 123 (L) 08/10/2023   No results found for: "FERRITIN", "IRON", "TIBC", "UIBC", "IRONPCTSAT" Lab Results  Component Value Date   RETICCTPCT 1.3 03/02/2023   RBC 2.96 (L) 08/10/2023   Lab Results  Component Value Date   KPAFRELGTCHN 45.6 (H) 06/15/2023   LAMBDASER 7.5 06/15/2023   KAPLAMBRATIO 6.08 (H) 06/15/2023   Lab Results  Component Value Date   IGGSERUM 697 06/15/2023   IGA 61 06/15/2023   IGMSERUM 32 06/15/2023   Lab Results  Component Value Date   TOTALPROTELP 6.4 09/19/2021   ALBUMINELP 4.2 09/19/2021   A1GS 0.2 09/19/2021   A2GS 0.6 09/19/2021   BETS 0.8 09/19/2021   BETA2SER 5.0 08/04/2012   GAMS 0.6 09/19/2021   MSPIKE Not Observed 09/19/2021   SPEI * 08/04/2012     Chemistry      Component Value Date/Time   NA 130 (L) 08/10/2023 0847   NA 131 (L) 02/03/2023 0900   K 4.7 08/10/2023 0847   CL 93 (L) 08/10/2023 0847   CO2 30 08/10/2023 0847   BUN 8 08/10/2023 0847   BUN 7 (L) 02/03/2023 0900   CREATININE 0.66 08/10/2023 0847   CREATININE 0.73 10/15/2015 0909   GLU 77 02/08/2022 0000      Component Value Date/Time   CALCIUM  9.6 08/10/2023 0847   ALKPHOS 57 08/10/2023 0847   AST 35 08/10/2023 0847   ALT 19 08/10/2023 0847   BILITOT 1.1 08/10/2023 0847       Impression and Plan: [name] is a very pleasant 73 yo caucasian gentleman with CLL that we had followed for about 17 years.  He is now on treatment with Gazyva  and Brukinsa . We dropped day 15 of cycle 1 due to the injuries he sustained during the fox attack. He is doing much better now.  We will now start cycle 2 today.  Follow-up in 1 month with start of  cycle 3.   Kennard Pea, NP 5/19/20259:41 AM

## 2023-08-10 NOTE — Patient Instructions (Signed)
 [facility] - A DEPT OF [facility]  Discharge Instructions: Thank you for choosing [facility] to provide your oncology and hematology care.   If you have a lab appointment with the Cancer Center, please go directly to the Cancer Center and check in at the registration area.  Wear comfortable clothing and clothing appropriate for easy access to any Portacath or PICC line.   We strive to give you quality time with your provider. You may need to reschedule your appointment if you arrive late (15 or more minutes).  Arriving late affects you and other patients whose appointments are after yours.  Also, if you miss three or more appointments without notifying the office, you may be dismissed from the clinic at the provider's discretion.      For prescription refill requests, have your pharmacy contact our office and allow 72 hours for refills to be completed.    Today you received the following chemotherapy and/or immunotherapy agents Gazyva      To help prevent nausea and vomiting after your treatment, we encourage you to take your nausea medication as directed.  BELOW ARE SYMPTOMS THAT SHOULD BE REPORTED IMMEDIATELY: *FEVER GREATER THAN 100.4 F (38 C) OR HIGHER *CHILLS OR SWEATING *NAUSEA AND VOMITING THAT IS NOT CONTROLLED WITH YOUR NAUSEA MEDICATION *UNUSUAL SHORTNESS OF BREATH *UNUSUAL BRUISING OR BLEEDING *URINARY PROBLEMS (pain or burning when urinating, or frequent urination) *BOWEL PROBLEMS (unusual diarrhea, constipation, pain near the anus) TENDERNESS IN MOUTH AND THROAT WITH OR WITHOUT PRESENCE OF ULCERS (sore throat, sores in mouth, or a toothache) UNUSUAL RASH, SWELLING OR PAIN  UNUSUAL VAGINAL DISCHARGE OR ITCHING   Items with * indicate a potential emergency and should be followed up as soon as possible or go to the Emergency Department if any problems should occur.  Please show the CHEMOTHERAPY ALERT CARD or IMMUNOTHERAPY ALERT  CARD at check-in to the Emergency Department and triage nurse. Should you have questions after your visit or need to cancel or reschedule your appointment, please contact [facility] - A DEPT OF [facility]  [phone] and follow the prompts.  Office hours are 8:00 a.m. to 4:30 p.m. Monday - Friday. Please note that voicemails left after 4:00 p.m. may not be returned until the following business day.  We are closed weekends and major holidays. You have access to a nurse at all times for urgent questions. Please call the main number to the clinic [phone] and follow the prompts.  For any non-urgent questions, you may also contact your provider using MyChart. We now offer e-Visits for anyone 75 and older to request care online for non-urgent symptoms. For details visit [email or web address].   Also download the MyChart app! Go to the app store, search "MyChart", open the app, select , and log in with your MyChart username and password.

## 2023-08-11 ENCOUNTER — Encounter: Payer: Self-pay | Admitting: Hematology & Oncology

## 2023-08-11 ENCOUNTER — Encounter: Payer: PPO | Admitting: Family Medicine

## 2023-08-12 ENCOUNTER — Other Ambulatory Visit: Payer: Self-pay

## 2023-08-14 ENCOUNTER — Other Ambulatory Visit: Payer: Self-pay

## 2023-08-24 ENCOUNTER — Other Ambulatory Visit: Payer: Self-pay

## 2023-08-24 ENCOUNTER — Other Ambulatory Visit (HOSPITAL_COMMUNITY): Payer: Self-pay

## 2023-08-25 ENCOUNTER — Other Ambulatory Visit: Payer: Self-pay

## 2023-08-26 ENCOUNTER — Other Ambulatory Visit: Payer: Self-pay

## 2023-08-26 ENCOUNTER — Other Ambulatory Visit (HOSPITAL_COMMUNITY): Payer: Self-pay

## 2023-08-28 ENCOUNTER — Other Ambulatory Visit: Payer: Self-pay

## 2023-09-01 ENCOUNTER — Other Ambulatory Visit: Payer: Self-pay

## 2023-09-01 NOTE — Progress Notes (Signed)
 Specialty Pharmacy Refill Coordination Note  George [name] is a 73 y.o. male contacted today regarding refills of specialty medication(s) Zanubrutinib  (Brukinsa )   Patient requested Delivery   Delivery date: 09/03/23   Verified address: [address]  [place] [address]   Medication will be filled on 06.11.25.

## 2023-09-03 ENCOUNTER — Other Ambulatory Visit: Payer: Self-pay

## 2023-09-04 ENCOUNTER — Other Ambulatory Visit: Payer: Self-pay

## 2023-09-08 ENCOUNTER — Inpatient Hospital Stay

## 2023-09-08 ENCOUNTER — Inpatient Hospital Stay: Admitting: Hematology & Oncology

## 2023-09-08 ENCOUNTER — Encounter: Payer: Self-pay | Admitting: Hematology & Oncology

## 2023-09-08 ENCOUNTER — Other Ambulatory Visit: Payer: Self-pay

## 2023-09-08 ENCOUNTER — Inpatient Hospital Stay: Attending: Hematology & Oncology

## 2023-09-08 ENCOUNTER — Telehealth: Payer: Self-pay

## 2023-09-08 VITALS — BP 115/86 | HR 81 | Temp 98.0°F | Resp 17

## 2023-09-08 VITALS — BP 145/79 | HR 77 | Temp 98.8°F | Resp 16 | Ht 68.5 in | Wt 155.0 lb

## 2023-09-08 DIAGNOSIS — I10 Essential (primary) hypertension: Secondary | ICD-10-CM

## 2023-09-08 DIAGNOSIS — R051 Acute cough: Secondary | ICD-10-CM

## 2023-09-08 DIAGNOSIS — Z85828 Personal history of other malignant neoplasm of skin: Secondary | ICD-10-CM

## 2023-09-08 DIAGNOSIS — R251 Tremor, unspecified: Secondary | ICD-10-CM

## 2023-09-08 DIAGNOSIS — C9110 Chronic lymphocytic leukemia of B-cell type not having achieved remission: Secondary | ICD-10-CM

## 2023-09-08 DIAGNOSIS — E559 Vitamin D deficiency, unspecified: Secondary | ICD-10-CM

## 2023-09-08 DIAGNOSIS — E782 Mixed hyperlipidemia: Secondary | ICD-10-CM

## 2023-09-08 DIAGNOSIS — C4442 Squamous cell carcinoma of skin of scalp and neck: Secondary | ICD-10-CM

## 2023-09-08 DIAGNOSIS — K219 Gastro-esophageal reflux disease without esophagitis: Secondary | ICD-10-CM

## 2023-09-08 DIAGNOSIS — L814 Other melanin hyperpigmentation: Secondary | ICD-10-CM

## 2023-09-08 DIAGNOSIS — Z79620 Long term (current) use of immunosuppressive biologic: Secondary | ICD-10-CM | POA: Insufficient documentation

## 2023-09-08 DIAGNOSIS — C4440 Unspecified malignant neoplasm of skin of scalp and neck: Secondary | ICD-10-CM

## 2023-09-08 DIAGNOSIS — Z5112 Encounter for antineoplastic immunotherapy: Secondary | ICD-10-CM | POA: Diagnosis present

## 2023-09-08 DIAGNOSIS — D225 Melanocytic nevi of trunk: Secondary | ICD-10-CM

## 2023-09-08 DIAGNOSIS — L570 Actinic keratosis: Secondary | ICD-10-CM

## 2023-09-08 DIAGNOSIS — R7401 Elevation of levels of liver transaminase levels: Secondary | ICD-10-CM

## 2023-09-08 DIAGNOSIS — H903 Sensorineural hearing loss, bilateral: Secondary | ICD-10-CM

## 2023-09-08 DIAGNOSIS — L821 Other seborrheic keratosis: Secondary | ICD-10-CM

## 2023-09-08 LAB — CBC WITH DIFFERENTIAL (CANCER CENTER ONLY)
Abs Immature Granulocytes: 0.09 10*3/uL — ABNORMAL HIGH (ref 0.00–0.07)
Basophils Absolute: 0 10*3/uL (ref 0.0–0.1)
Basophils Relative: 0 %
Eosinophils Absolute: 0 10*3/uL (ref 0.0–0.5)
Eosinophils Relative: 0 %
HCT: 33 % — ABNORMAL LOW (ref 39.0–52.0)
Hemoglobin: 10.7 g/dL — ABNORMAL LOW (ref 13.0–17.0)
Immature Granulocytes: 0 %
Lymphocytes Relative: 95 %
Lymphs Abs: 80.1 10*3/uL — ABNORMAL HIGH (ref 0.7–4.0)
MCH: 34.2 pg — ABNORMAL HIGH (ref 26.0–34.0)
MCHC: 32.4 g/dL (ref 30.0–36.0)
MCV: 105.4 fL — ABNORMAL HIGH (ref 80.0–100.0)
Monocytes Absolute: 0.5 10*3/uL (ref 0.1–1.0)
Monocytes Relative: 1 %
Neutro Abs: 2.9 10*3/uL (ref 1.7–7.7)
Neutrophils Relative %: 4 %
Platelet Count: 170 10*3/uL (ref 150–400)
RBC: 3.13 MIL/uL — ABNORMAL LOW (ref 4.22–5.81)
RDW: 12.6 % (ref 11.5–15.5)
Smear Review: NORMAL
WBC Count: 83.6 10*3/uL (ref 4.0–10.5)
nRBC: 0 % (ref 0.0–0.2)

## 2023-09-08 LAB — CMP (CANCER CENTER ONLY)
ALT: 35 U/L (ref 0–44)
AST: 56 U/L — ABNORMAL HIGH (ref 15–41)
Albumin: 4.8 g/dL (ref 3.5–5.0)
Alkaline Phosphatase: 60 U/L (ref 38–126)
Anion gap: 8 (ref 5–15)
BUN: 8 mg/dL (ref 8–23)
CO2: 29 mmol/L (ref 22–32)
Calcium: 9.5 mg/dL (ref 8.9–10.3)
Chloride: 90 mmol/L — ABNORMAL LOW (ref 98–111)
Creatinine: 0.68 mg/dL (ref 0.61–1.24)
GFR, Estimated: >60 mL/min (ref >60)
Glucose, Bld: 125 mg/dL — ABNORMAL HIGH (ref 70–99)
Potassium: 4.6 mmol/L (ref 3.5–5.1)
Sodium: 127 mmol/L — ABNORMAL LOW (ref 135–145)
Total Bilirubin: 1 mg/dL (ref 0.0–1.2)
Total Protein: 6.5 g/dL (ref 6.5–8.1)

## 2023-09-08 LAB — LACTATE DEHYDROGENASE: LDH: 65 U/L — ABNORMAL LOW (ref 98–192)

## 2023-09-08 MED ORDER — SODIUM CHLORIDE 0.9 % IV SOLN
20.0000 mg | Freq: Once | INTRAVENOUS | Status: AC
  Administered 2023-09-08: 20 mg via INTRAVENOUS
  Filled 2023-09-08: qty 20

## 2023-09-08 MED ORDER — SODIUM CHLORIDE 0.9 % IV SOLN
1000.0000 mg | Freq: Once | INTRAVENOUS | Status: AC
  Administered 2023-09-08: 1000 mg via INTRAVENOUS
  Filled 2023-09-08: qty 40

## 2023-09-08 MED ORDER — DIPHENHYDRAMINE HCL 50 MG/ML IJ SOLN
50.0000 mg | Freq: Once | INTRAMUSCULAR | Status: AC
  Administered 2023-09-08: 25 mg via INTRAVENOUS
  Filled 2023-09-08: qty 1

## 2023-09-08 MED ORDER — SODIUM CHLORIDE 0.9 % IV SOLN: INTRAVENOUS | Status: DC

## 2023-09-08 MED ORDER — ACETAMINOPHEN 325 MG PO TABS
650.0000 mg | ORAL_TABLET | Freq: Once | ORAL | Status: AC
  Administered 2023-09-08: 650 mg via ORAL
  Filled 2023-09-08: qty 2

## 2023-09-08 NOTE — Patient Instructions (Signed)
 [facility] - A DEPT OF [facility]  Discharge Instructions: Thank you for choosing [facility] to provide your oncology and hematology care.   If you have a lab appointment with the Cancer Center, please go directly to the Cancer Center and check in at the registration area.  Wear comfortable clothing and clothing appropriate for easy access to any Portacath or PICC line.   We strive to give you quality time with your provider. You may need to reschedule your appointment if you arrive late (15 or more minutes).  Arriving late affects you and other patients whose appointments are after yours.  Also, if you miss three or more appointments without notifying the office, you may be dismissed from the clinic at the provider's discretion.      For prescription refill requests, have your pharmacy contact our office and allow 72 hours for refills to be completed.    Today you received the following chemotherapy and/or immunotherapy agents Gazyva      To help prevent nausea and vomiting after your treatment, we encourage you to take your nausea medication as directed.  BELOW ARE SYMPTOMS THAT SHOULD BE REPORTED IMMEDIATELY: *FEVER GREATER THAN 100.4 F (38 C) OR HIGHER *CHILLS OR SWEATING *NAUSEA AND VOMITING THAT IS NOT CONTROLLED WITH YOUR NAUSEA MEDICATION *UNUSUAL SHORTNESS OF BREATH *UNUSUAL BRUISING OR BLEEDING *URINARY PROBLEMS (pain or burning when urinating, or frequent urination) *BOWEL PROBLEMS (unusual diarrhea, constipation, pain near the anus) TENDERNESS IN MOUTH AND THROAT WITH OR WITHOUT PRESENCE OF ULCERS (sore throat, sores in mouth, or a toothache) UNUSUAL RASH, SWELLING OR PAIN  UNUSUAL VAGINAL DISCHARGE OR ITCHING   Items with * indicate a potential emergency and should be followed up as soon as possible or go to the Emergency Department if any problems should occur.  Please show the CHEMOTHERAPY ALERT CARD or IMMUNOTHERAPY ALERT  CARD at check-in to the Emergency Department and triage nurse. Should you have questions after your visit or need to cancel or reschedule your appointment, please contact [facility] - A DEPT OF [facility]  [phone] and follow the prompts.  Office hours are 8:00 a.m. to 4:30 p.m. Monday - Friday. Please note that voicemails left after 4:00 p.m. may not be returned until the following business day.  We are closed weekends and major holidays. You have access to a nurse at all times for urgent questions. Please call the main number to the clinic [phone] and follow the prompts.  For any non-urgent questions, you may also contact your provider using MyChart. We now offer e-Visits for anyone 75 and older to request care online for non-urgent symptoms. For details visit [email or web address].   Also download the MyChart app! Go to the app store, search "MyChart", open the app, select , and log in with your MyChart username and password.

## 2023-09-08 NOTE — Telephone Encounter (Signed)
 Critical WBC of 83.6 received from lab, MD notified
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
## 2023-09-30 ENCOUNTER — Other Ambulatory Visit (HOSPITAL_COMMUNITY): Payer: Self-pay

## 2023-09-30 ENCOUNTER — Other Ambulatory Visit: Payer: Self-pay

## 2023-10-01 ENCOUNTER — Other Ambulatory Visit (HOSPITAL_COMMUNITY): Payer: Self-pay

## 2023-10-01 ENCOUNTER — Other Ambulatory Visit: Payer: Self-pay

## 2023-10-01 ENCOUNTER — Other Ambulatory Visit: Payer: Self-pay | Admitting: *Deleted

## 2023-10-01 DIAGNOSIS — E559 Vitamin D deficiency, unspecified: Secondary | ICD-10-CM

## 2023-10-01 DIAGNOSIS — Z131 Encounter for screening for diabetes mellitus: Secondary | ICD-10-CM

## 2023-10-01 DIAGNOSIS — E782 Mixed hyperlipidemia: Secondary | ICD-10-CM

## 2023-10-01 DIAGNOSIS — I10 Essential (primary) hypertension: Secondary | ICD-10-CM

## 2023-10-01 NOTE — Progress Notes (Signed)
 Specialty Pharmacy Ongoing Clinical Assessment Note  George [name] is a 73 y.o. male who is being followed by the specialty pharmacy service for RxSp Oncology   Patient's specialty medication(s) reviewed today: Zanubrutinib  (Brukinsa )   Missed doses in the last 4 weeks: 0   Patient/Caregiver did not have any additional questions or concerns.   Therapeutic benefit summary: Patient is achieving benefit   Adverse events/side effects summary: No adverse events/side effects   Patient's therapy is appropriate to: Continue    Goals Addressed             This Visit's Progress    Slow Disease Progression   On track    Patient is on track. Patient will maintain adherence          Follow up: 3 months  Silvano [name] Specialty Pharmacist

## 2023-10-01 NOTE — Progress Notes (Signed)
 Specialty Pharmacy Refill Coordination Note  George [name] is a 73 y.o. male contacted today regarding refills of specialty medication(s) Zanubrutinib  (Brukinsa )   Patient requested Delivery   Delivery date: 10/06/23   Verified address: [address] [place] [address]   Medication will be filled on 10/05/23.

## 2023-10-02 ENCOUNTER — Other Ambulatory Visit

## 2023-10-02 DIAGNOSIS — Z131 Encounter for screening for diabetes mellitus: Secondary | ICD-10-CM

## 2023-10-02 DIAGNOSIS — I10 Essential (primary) hypertension: Secondary | ICD-10-CM

## 2023-10-02 DIAGNOSIS — E559 Vitamin D deficiency, unspecified: Secondary | ICD-10-CM

## 2023-10-02 DIAGNOSIS — E782 Mixed hyperlipidemia: Secondary | ICD-10-CM

## 2023-10-03 ENCOUNTER — Encounter: Payer: Self-pay | Admitting: Hematology & Oncology

## 2023-10-03 ENCOUNTER — Telehealth: Payer: Self-pay | Admitting: Family Medicine

## 2023-10-03 LAB — COMPREHENSIVE METABOLIC PANEL WITH GFR
ALT: 21 IU/L (ref 0–44)
AST: 44 IU/L — ABNORMAL HIGH (ref 0–40)
Albumin: 4.5 g/dL (ref 3.8–4.8)
Alkaline Phosphatase: 65 IU/L (ref 44–121)
BUN/Creatinine Ratio: 11 (ref 10–24)
BUN: 8 mg/dL (ref 8–27)
Bilirubin Total: 1.1 mg/dL (ref 0.0–1.2)
CO2: 21 mmol/L (ref 20–29)
Calcium: 9.2 mg/dL (ref 8.6–10.2)
Chloride: 94 mmol/L — ABNORMAL LOW (ref 96–106)
Creatinine, Ser: 0.74 mg/dL — ABNORMAL LOW (ref 0.76–1.27)
Globulin, Total: 1.8 g/dL (ref 1.5–4.5)
Glucose: 83 mg/dL (ref 70–99)
Potassium: 4 mmol/L (ref 3.5–5.2)
Sodium: 130 mmol/L — ABNORMAL LOW (ref 134–144)
Total Protein: 6.3 g/dL (ref 6.0–8.5)
eGFR: 96 mL/min/1.73 (ref >59)

## 2023-10-03 LAB — CBC WITH DIFFERENTIAL/PLATELET
Basophils Absolute: 0.1 x10E3/uL (ref 0.0–0.2)
Basos: 0 %
EOS (ABSOLUTE): 0 x10E3/uL (ref 0.0–0.4)
Eos: 0 %
Hematocrit: 36 % — ABNORMAL LOW (ref 37.5–51.0)
Hemoglobin: 11.1 g/dL — ABNORMAL LOW (ref 13.0–17.7)
Immature Grans (Abs): 0 x10E3/uL (ref 0.0–0.1)
Immature Granulocytes: 0 %
Lymphocytes Absolute: 50.1 x10E3/uL — ABNORMAL HIGH (ref 0.7–3.1)
Lymphs: 94 %
MCH: 33 pg (ref 26.6–33.0)
MCHC: 30.8 g/dL — ABNORMAL LOW (ref 31.5–35.7)
MCV: 107 fL — ABNORMAL HIGH (ref 79–97)
Monocytes Absolute: 0.5 x10E3/uL (ref 0.1–0.9)
Monocytes: 1 %
Neutrophils Absolute: 2.6 x10E3/uL (ref 1.4–7.0)
Neutrophils: 5 %
Platelets: 133 x10E3/uL — ABNORMAL LOW (ref 150–450)
RBC: 3.36 x10E6/uL — ABNORMAL LOW (ref 4.14–5.80)
RDW: 12.6 % (ref 11.6–15.4)
WBC: 53.4 x10E3/uL (ref 3.4–10.8)

## 2023-10-03 LAB — LIPID PANEL
Chol/HDL Ratio: 1.6 ratio (ref 0.0–5.0)
Cholesterol, Total: 156 mg/dL (ref 100–199)
HDL: 99 mg/dL (ref >39)
LDL Chol Calc (NIH): 47 mg/dL (ref 0–99)
Triglycerides: 41 mg/dL (ref 0–149)
VLDL Cholesterol Cal: 10 mg/dL (ref 5–40)

## 2023-10-03 LAB — VITAMIN D 25 HYDROXY (VIT D DEFICIENCY, FRACTURES): Vit D, 25-Hydroxy: 101 ng/mL — ABNORMAL HIGH (ref 30.0–100.0)

## 2023-10-03 LAB — TSH: TSH: 2.32 u[IU]/mL (ref 0.450–4.500)

## 2023-10-03 LAB — HEMOGLOBIN A1C
Est. average glucose Bld gHb Est-mCnc: 94 mg/dL
Hgb A1c MFr Bld: 4.9 % (ref 4.8–5.6)

## 2023-10-05 ENCOUNTER — Ambulatory Visit: Payer: Self-pay

## 2023-10-05 NOTE — Telephone Encounter (Signed)
 This is an error.

## 2023-10-06 ENCOUNTER — Inpatient Hospital Stay: Attending: Hematology & Oncology

## 2023-10-06 ENCOUNTER — Inpatient Hospital Stay

## 2023-10-06 ENCOUNTER — Inpatient Hospital Stay (HOSPITAL_BASED_OUTPATIENT_CLINIC_OR_DEPARTMENT_OTHER): Admitting: Family

## 2023-10-06 ENCOUNTER — Encounter: Payer: Self-pay | Admitting: Family

## 2023-10-06 VITALS — BP 146/84 | HR 88 | Temp 98.3°F | Resp 18

## 2023-10-06 VITALS — BP 152/85 | HR 73 | Temp 98.7°F | Resp 18 | Ht 68.0 in | Wt 159.8 lb

## 2023-10-06 DIAGNOSIS — C9110 Chronic lymphocytic leukemia of B-cell type not having achieved remission: Secondary | ICD-10-CM | POA: Diagnosis not present

## 2023-10-06 DIAGNOSIS — Z79620 Long term (current) use of immunosuppressive biologic: Secondary | ICD-10-CM | POA: Diagnosis not present

## 2023-10-06 DIAGNOSIS — Z5112 Encounter for antineoplastic immunotherapy: Secondary | ICD-10-CM | POA: Insufficient documentation

## 2023-10-06 LAB — CBC WITH DIFFERENTIAL (CANCER CENTER ONLY)
Abs Immature Granulocytes: 0.11 K/uL — ABNORMAL HIGH (ref 0.00–0.07)
Basophils Absolute: 0 K/uL (ref 0.0–0.1)
Basophils Relative: 0 %
Eosinophils Absolute: 0 K/uL (ref 0.0–0.5)
Eosinophils Relative: 0 %
HCT: 31.5 % — ABNORMAL LOW (ref 39.0–52.0)
Hemoglobin: 10.5 g/dL — ABNORMAL LOW (ref 13.0–17.0)
Immature Granulocytes: 0 %
Lymphocytes Relative: 92 %
Lymphs Abs: 51.9 K/uL — ABNORMAL HIGH (ref 0.7–4.0)
MCH: 32.7 pg (ref 26.0–34.0)
MCHC: 33.3 g/dL (ref 30.0–36.0)
MCV: 98.1 fL (ref 80.0–100.0)
Monocytes Absolute: 0.6 K/uL (ref 0.1–1.0)
Monocytes Relative: 1 %
Neutro Abs: 4.2 K/uL (ref 1.7–7.7)
Neutrophils Relative %: 7 %
Platelet Count: 159 K/uL (ref 150–400)
RBC: 3.21 MIL/uL — ABNORMAL LOW (ref 4.22–5.81)
RDW: 13.3 % (ref 11.5–15.5)
WBC Count: 56.9 K/uL (ref 4.0–10.5)
nRBC: 0 % (ref 0.0–0.2)

## 2023-10-06 LAB — CMP (CANCER CENTER ONLY)
ALT: 17 U/L (ref 0–44)
AST: 35 U/L (ref 15–41)
Albumin: 4.4 g/dL (ref 3.5–5.0)
Alkaline Phosphatase: 52 U/L (ref 38–126)
Anion gap: 10 (ref 5–15)
BUN: 8 mg/dL (ref 8–23)
CO2: 23 mmol/L (ref 22–32)
Calcium: 9.3 mg/dL (ref 8.9–10.3)
Chloride: 92 mmol/L — ABNORMAL LOW (ref 98–111)
Creatinine: 0.66 mg/dL (ref 0.61–1.24)
GFR, Estimated: >60 mL/min (ref >60)
Glucose, Bld: 83 mg/dL (ref 70–99)
Potassium: 3.8 mmol/L (ref 3.5–5.1)
Sodium: 125 mmol/L — ABNORMAL LOW (ref 135–145)
Total Bilirubin: 0.9 mg/dL (ref 0.0–1.2)
Total Protein: 6.4 g/dL — ABNORMAL LOW (ref 6.5–8.1)

## 2023-10-06 LAB — SAVE SMEAR(SSMR), FOR PROVIDER SLIDE REVIEW

## 2023-10-06 LAB — LACTATE DEHYDROGENASE: LDH: 69 U/L — ABNORMAL LOW (ref 98–192)

## 2023-10-06 LAB — URIC ACID: Uric Acid, Serum: 2.8 mg/dL — ABNORMAL LOW (ref 3.7–8.6)

## 2023-10-06 MED ORDER — DIPHENHYDRAMINE HCL 50 MG/ML IJ SOLN
50.0000 mg | Freq: Once | INTRAMUSCULAR | Status: AC
  Administered 2023-10-06: 50 mg via INTRAVENOUS
  Filled 2023-10-06: qty 1

## 2023-10-06 MED ORDER — ACETAMINOPHEN 325 MG PO TABS
650.0000 mg | ORAL_TABLET | Freq: Once | ORAL | Status: AC
  Administered 2023-10-06: 650 mg via ORAL
  Filled 2023-10-06: qty 2

## 2023-10-06 MED ORDER — SODIUM CHLORIDE 0.9 % IV SOLN
1000.0000 mg | Freq: Once | INTRAVENOUS | Status: AC
  Administered 2023-10-06: 1000 mg via INTRAVENOUS
  Filled 2023-10-06: qty 40

## 2023-10-06 MED ORDER — SODIUM CHLORIDE 0.9 % IV SOLN: INTRAVENOUS | Status: DC

## 2023-10-06 MED ORDER — SODIUM CHLORIDE 0.9 % IV SOLN
20.0000 mg | Freq: Once | INTRAVENOUS | Status: AC
  Administered 2023-10-06: 20 mg via INTRAVENOUS
  Filled 2023-10-06: qty 20

## 2023-10-06 NOTE — Progress Notes (Signed)
 Hematology and Oncology Follow Up Visit  George [name] [id number] 1951/02/19 73 y.o. 10/06/2023   Principle Diagnosis:  CLL-stage C   Current Therapy:        Gazyva /Brukinsa /venetoclax -- s/p cycle #2 - start on 07/14/2023,        Interim History:  George [name] is hete today for follow-up and treatment. He is doing well so far and has no complaints at this time.  He is still cycling every day and stay active.  Mild fatigue after a long day.  Some mild bruising with IV sticks on his fore arms. No issue with abnormal bleeding, no petechiae.  No issue with infections. No fever, chills, n/v, cough, rash, dizziness, SOB, chest pain, palpitations, abdominal pain or changes in bowel or bladder habits.  No swelling, tenderness, numbness or tingling in his extremities.  No falls or syncope reported.  Appetite and hydration are good. Weight is stable at 155 lbs.   ECOG Performance Status: 0 - Asymptomatic  Medications:  Allergies as of 10/06/2023       Reactions   Doxycycline  Other (See Comments)   Sore throat, red tongue, red skin Patient states that he was also on a camping trip when the symptoms developed, so he is unsure if they were due to the doxycycline  or something that he encountered the mountains.        Medication List        Accurate as of October 06, 2023  8:56 AM. If you have any questions, ask your nurse or doctor.          STOP taking these medications    amoxicillin -clavulanate 875-125 MG tablet Commonly known as: AUGMENTIN  Stopped by: Lauraine Pepper       TAKE these medications    allopurinol  100 MG tablet Commonly known as: Zyloprim  Take 1 tablet (100 mg total) by mouth daily.   Brukinsa  80 MG capsule Generic drug: zanubrutinib  Take 2 capsules (160 mg total) by mouth 2 (two) times daily.   cholecalciferol 1000 units tablet Commonly known as: VITAMIN D  Take 1,000 Units by mouth daily. 2 tablets daily   CO Q 10 PO Take by mouth every morning.    esomeprazole  20 MG capsule Commonly known as: NEXIUM  Take 1 capsule (20 mg total) by mouth daily.   famciclovir  500 MG tablet Commonly known as: FAMVIR  Take 1 tablet (500 mg total) by mouth daily.   fluorouracil  5 % cream Commonly known as: EFUDEX  Apply 1 Application topically daily.   GLUCOSAMINE HCL PO Take by mouth every morning.   losartan  50 MG tablet Commonly known as: COZAAR  Take 1 tablet (50 mg total) by mouth daily.   OVER THE COUNTER MEDICATION 2 (two) times daily. CLEAR- One spray BID.   rosuvastatin  40 MG tablet Commonly known as: CRESTOR  Take 1 tablet (40 mg total) by mouth daily.   UNABLE TO FIND SPF 30+ CREAM/LOTION        Allergies:  Allergies  Allergen Reactions   Doxycycline  Other (See Comments)    Sore throat, red tongue, red skin  Patient states that he was also on a camping trip when the symptoms developed, so he is unsure if they were due to the doxycycline  or something that he encountered the mountains.    Past Medical History, Surgical history, Social history, and Family History were reviewed and updated.  Review of Systems: All other 10 point review of systems is negative.   Physical Exam:  height is 5' 8 (1.727 m)  and weight is 159 lb 12.8 oz (72.5 kg). His oral temperature is 98.7 F (37.1 C). His blood pressure is 152/85 (abnormal) and his pulse is 73. His respiration is 18 and oxygen saturation is 100%.   Wt Readings from Last 3 Encounters:  10/06/23 159 lb 12.8 oz (72.5 kg)  09/08/23 155 lb (70.3 kg)  08/10/23 156 lb 12.8 oz (71.1 kg)    Ocular: Sclerae unicteric, pupils equal, round and reactive to light Ear-nose-throat: Oropharynx clear, dentition fair Lymphatic: No cervical or supraclavicular adenopathy Lungs no rales or rhonchi, good excursion bilaterally Heart regular rate and rhythm, no murmur appreciated Abd soft, nontender, positive bowel sounds MSK no focal spinal tenderness, no joint edema Neuro: non-focal,  well-oriented, appropriate affect Breasts: Deferred   Lab Results  Component Value Date   WBC PENDING 10/06/2023   HGB 10.5 (L) 10/06/2023   HCT 31.5 (L) 10/06/2023   MCV 98.1 10/06/2023   PLT 159 10/06/2023   No results found for: FERRITIN, IRON, TIBC, UIBC, IRONPCTSAT Lab Results  Component Value Date   RETICCTPCT 1.3 03/02/2023   RBC 3.21 (L) 10/06/2023   Lab Results  Component Value Date   KPAFRELGTCHN 45.6 (H) 06/15/2023   LAMBDASER 7.5 06/15/2023   KAPLAMBRATIO 6.08 (H) 06/15/2023   Lab Results  Component Value Date   IGGSERUM 697 06/15/2023   IGA 61 06/15/2023   IGMSERUM 32 06/15/2023   Lab Results  Component Value Date   TOTALPROTELP 6.4 09/19/2021   ALBUMINELP 4.2 09/19/2021   A1GS 0.2 09/19/2021   A2GS 0.6 09/19/2021   BETS 0.8 09/19/2021   BETA2SER 5.0 08/04/2012   GAMS 0.6 09/19/2021   MSPIKE Not Observed 09/19/2021   SPEI * 08/04/2012     Chemistry      Component Value Date/Time   NA 130 (L) 10/02/2023 0951   K 4.0 10/02/2023 0951   CL 94 (L) 10/02/2023 0951   CO2 21 10/02/2023 0951   BUN 8 10/02/2023 0951   CREATININE 0.74 (L) 10/02/2023 0951   CREATININE 0.68 09/08/2023 0849   CREATININE 0.73 10/15/2015 0909   GLU 77 02/08/2022 0000      Component Value Date/Time   CALCIUM  9.2 10/02/2023 0951   ALKPHOS 65 10/02/2023 0951   AST 44 (H) 10/02/2023 0951   AST 56 (H) 09/08/2023 0849   ALT 21 10/02/2023 0951   ALT 35 09/08/2023 0849   BILITOT 1.1 10/02/2023 0951   BILITOT 1.0 09/08/2023 0849       Impression and Plan: Mr. Raben is a very pleasant 73 yo caucasian gentleman with CLL that we had followed for about 17 years.  WBC count is coming down at 56, lymphocytes are at 92%.  We will proceed with cycle 4 of Gazyva  today as planned.  He is tolerating Brukinsa  nicely. No changes.  Dr. Timmy will consider adding Venetoclax once his WBC count is less than 20.  Follow-up in 1 month.   Lauraine Pepper, NP 7/15/20258:56 AM

## 2023-10-06 NOTE — Patient Instructions (Signed)
Obinutuzumab Injection What is this medication? OBINUTUZUMAB (OH bi nue TOOZ ue mab) treats leukemia and lymphoma. It works by blocking a protein that causes cancer cells to grow and multiply. This helps to slow or stop the spread of cancer cells. It is a monoclonal antibody. This medicine may be used for other purposes; ask your health care provider or pharmacist if you have questions. COMMON BRAND NAME(S): GAZYVA What should I tell my care team before I take this medication? They need to know if you have any of these conditions: Heart disease Infection, especially a viral infection, such as hepatitis B Lung or breathing disease Take medications that treat or prevent blood clots An unusual or allergic reaction to obinutuzumab, other medications, foods, dyes, or preservatives Pregnant or trying to get pregnant Breastfeeding How should I use this medication? This medication is for infusion into a vein. It is given by a care team in a hospital or clinic setting. Talk to your care team about the use of this medication in children. Special care may be needed. Overdosage: If you think you have taken too much of this medicine contact a poison control center or emergency room at once. NOTE: This medicine is only for you. Do not share this medicine with others. What if I miss a dose? Keep appointments for follow-up doses as directed. It is important not to miss your dose. Call your care team if you are unable to keep an appointment. What may interact with this medication? Live virus vaccines This list may not describe all possible interactions. Give your health care provider a list of all the medicines, herbs, non-prescription drugs, or dietary supplements you use. Also tell them if you smoke, drink alcohol, or use illegal drugs. Some items may interact with your medicine. What should I watch for while using this medication? Report any side effects that you notice during your treatment right away,  such as changes in your breathing, fever, chills, dizziness or lightheadedness. These effects are more common with the first dose. Visit your care team for checks on your progress. You will need to have regular blood work. Report any other side effects. The side effects of this medication can continue after you finish your treatment. Continue your course of treatment even though you feel ill unless your care team tells you to stop. Call your care team for advice if you get a fever, chills or sore throat, or other symptoms of a cold or flu. Do not treat yourself. This medication decreases your body's ability to fight infections. Try to avoid being around people who are sick. This medication may increase your risk to bruise or bleed. Call your care team if you notice any unusual bleeding. Do not become pregnant while taking this medication or for 6 months after stopping it. Inform your care team if you wish to become pregnant or think you might be pregnant. There is a potential for serious side effects to an unborn child. Talk to your care team or pharmacist for more information. Do not breast-feed an infant while taking this medication or for 6 months after stopping it. What side effects may I notice from receiving this medication? Side effects that you should report to your care team as soon as possible: Allergic reactions--skin rash, itching, hives, swelling of the face, lips, tongue, or throat Bleeding--bloody or black, tar-like stools, vomiting blood or brown material that looks like coffee grounds, red or dark brown urine, small red or purple spots on skin, unusual bruising  or bleeding Blood clot--pain, swelling, or warmth in the leg, shortness of breath, chest pain Dizziness, loss of balance or coordination, confusion or trouble speaking Infection--fever, chills, cough, sore throat, wounds that don't heal, pain or trouble when passing urine, general feeling of discomfort or being unwell Infusion  reactions--chest pain, shortness of breath or trouble breathing, feeling faint or lightheaded Liver injury--right upper belly pain, loss of appetite, nausea, light-colored stool, dark yellow or brown urine, yellowing skin or eyes, unusual weakness or fatigue Tumor lysis syndrome (TLS)--nausea, vomiting, diarrhea, decrease in the amount of urine, dark urine, unusual weakness or fatigue, confusion, muscle pain or cramps, fast or irregular heartbeat, joint pain Side effects that usually do not require medical attention (report to your care team if they continue or are bothersome): Bone, joint, or muscle pain Constipation Diarrhea Fatigue Runny or stuffy nose Sore throat This list may not describe all possible side effects. Call your doctor for medical advice about side effects. You may report side effects to FDA at 1-800-FDA-1088. Where should I keep my medication? This medication is only given in a hospital or clinic and will not be stored at home. NOTE: This sheet is a summary. It may not cover all possible information. If you have questions about this medicine, talk to your doctor, pharmacist, or health care provider.  2024 Elsevier/Gold Standard (2021-07-31 00:00:00)

## 2023-10-07 ENCOUNTER — Other Ambulatory Visit: Payer: Self-pay

## 2023-10-09 ENCOUNTER — Ambulatory Visit (INDEPENDENT_AMBULATORY_CARE_PROVIDER_SITE_OTHER)

## 2023-10-09 ENCOUNTER — Other Ambulatory Visit: Payer: Self-pay

## 2023-10-09 ENCOUNTER — Other Ambulatory Visit (HOSPITAL_COMMUNITY): Payer: Self-pay

## 2023-10-09 VITALS — BP 136/79 | HR 67 | Temp 97.8°F | Ht 68.0 in | Wt 158.1 lb

## 2023-10-09 DIAGNOSIS — E559 Vitamin D deficiency, unspecified: Secondary | ICD-10-CM | POA: Diagnosis not present

## 2023-10-09 DIAGNOSIS — I10 Essential (primary) hypertension: Secondary | ICD-10-CM

## 2023-10-09 DIAGNOSIS — K219 Gastro-esophageal reflux disease without esophagitis: Secondary | ICD-10-CM

## 2023-10-09 DIAGNOSIS — C9110 Chronic lymphocytic leukemia of B-cell type not having achieved remission: Secondary | ICD-10-CM

## 2023-10-09 DIAGNOSIS — Z131 Encounter for screening for diabetes mellitus: Secondary | ICD-10-CM

## 2023-10-09 DIAGNOSIS — E782 Mixed hyperlipidemia: Secondary | ICD-10-CM | POA: Diagnosis not present

## 2023-10-09 MED ORDER — ESOMEPRAZOLE MAGNESIUM 20 MG PO CPDR
20.0000 mg | DELAYED_RELEASE_CAPSULE | Freq: Every day | ORAL | 1 refills | Status: AC
  Filled 2023-10-09: qty 90, 90d supply, fill #0
  Filled 2024-01-27: qty 90, 90d supply, fill #1

## 2023-10-09 MED ORDER — LOSARTAN POTASSIUM 50 MG PO TABS
50.0000 mg | ORAL_TABLET | Freq: Every day | ORAL | 1 refills | Status: AC
  Filled 2023-10-09 – 2023-10-27 (×2): qty 90, 90d supply, fill #0
  Filled 2024-03-02: qty 90, 90d supply, fill #1

## 2023-10-09 MED ORDER — ROSUVASTATIN CALCIUM 40 MG PO TABS
40.0000 mg | ORAL_TABLET | Freq: Every day | ORAL | 1 refills | Status: DC
  Filled 2023-10-09: qty 90, 90d supply, fill #0
  Filled 2024-01-12 – 2024-01-13 (×2): qty 90, 90d supply, fill #1

## 2023-10-09 NOTE — Assessment & Plan Note (Signed)
 Stable.  Continue esomeprazole  20 mg every other day as tolerated, may take every day if needed.

## 2023-10-09 NOTE — Patient Instructions (Signed)
 It was nice to see you today!  As we discussed in clinic:  -Your blood work has improved or is stable from last time we checked it! -Continue your current medications as prescribed. Monitor blood pressure at home for me a few times per week and if readings are consistently lower than 120/80, then we can talk about decreasing your losartan !  -I have sent in the refills of your medications!   -We can hold off on colonoscopy until whenever you are ready! -I will see you back in 6 months for a follow up! It was nice to meet you  If you have any problems before your next visit feel free to message me via MyChart (minor issues or questions) or call the office, otherwise you may reach out to schedule an office visit.  Thank you! Saddie Sacks, PA-C

## 2023-10-09 NOTE — Progress Notes (Signed)
 Complete physical exam  Patient: George [name]   DOB: 1950-04-12   73 y.o. Male  MRN: [id number]  Subjective:    Chief Complaint  Patient presents with   Annual Exam    Physical    George [name] is a 73 y.o. male who presents today for a complete physical exam. He reports consuming a general diet. Exercises regularly through cycling. Rides 25+ miles per day. He generally feels well. He reports sleeping well. Regular bowel movements. He does not have additional problems to discuss today.   Currently undergoing treatment with Dr. Timmy for CLL. Tolerating treatment well and reports that he feels good. He has also recovered well from his recent fox attack and has had all appropriate rabies vaccines. Most recent fall risk assessment:    10/09/2023    8:23 AM  Fall Risk   Falls in the past year? 0  Risk for fall due to : No Fall Risks  Follow up Falls evaluation completed     Most recent depression screenings:    10/09/2023    8:23 AM 10/06/2023    8:43 AM  PHQ 2/9 Scores  PHQ - 2 Score 0 0    Vision:Within last year and Dental: No current dental problems and Receives regular dental care    Patient Care Team: Gayle Saddie JULIANNA DEVONNA as PCP - General (Physician Assistant) Tricia, Tawni CROME, MD as Referring Physician (Dermatology) Timmy Maude JONELLE, MD as Consulting Physician (Oncology) Mansouraty, Aloha Raddle., MD as Consulting Physician (Gastroenterology) Izell Domino, MD as Consulting Physician (Radiation Oncology) Malmfelt, Delon CROME, RN as Oncology Nurse Navigator Gladis Husband, MD as Referring Physician (Dermatology)   Outpatient Medications Prior to Visit  Medication Sig   allopurinol  (ZYLOPRIM ) 100 MG tablet Take 1 tablet (100 mg total) by mouth daily.   cholecalciferol (VITAMIN D ) 1000 UNITS tablet Take 1,000 Units by mouth daily. 2 tablets daily   Coenzyme Q10 (CO Q 10 PO) Take by mouth every morning.    famciclovir  (FAMVIR ) 500 MG tablet Take 1 tablet  (500 mg total) by mouth daily.   fluorouracil  (EFUDEX ) 5 % cream Apply 1 Application topically daily.   GLUCOSAMINE HCL PO Take by mouth every morning.    OVER THE COUNTER MEDICATION 2 (two) times daily. CLEAR- One spray BID.   UNABLE TO FIND SPF 30+ CREAM/LOTION   zanubrutinib  (BRUKINSA ) 80 MG capsule Take 2 capsules (160 mg total) by mouth 2 (two) times daily.   [DISCONTINUED] esomeprazole  (NEXIUM ) 20 MG capsule Take 1 capsule (20 mg total) by mouth daily.   [DISCONTINUED] losartan  (COZAAR ) 50 MG tablet Take 1 tablet (50 mg total) by mouth daily.   [DISCONTINUED] rosuvastatin  (CRESTOR ) 40 MG tablet Take 1 tablet (40 mg total) by mouth daily.   No facility-administered medications prior to visit.    ROS  Per HPI      Objective:     BP 136/79   Pulse 67   Temp 97.8 F (36.6 C) (Oral)   Ht 5' 8 (1.727 m)   Wt 158 lb 1.3 oz (71.7 kg)   SpO2 99%   BMI 24.04 kg/m    Physical Exam Constitutional:      General: He is not in acute distress.    Appearance: Normal appearance.  HENT:     Right Ear: Tympanic membrane normal.     Left Ear: Tympanic membrane normal.     Mouth/Throat:     Mouth: Mucous membranes are moist.  Pharynx: Oropharynx is clear.  Eyes:     Pupils: Pupils are equal, round, and reactive to light.  Cardiovascular:     Rate and Rhythm: Normal rate and regular rhythm.     Heart sounds: Normal heart sounds. No murmur heard.    No friction rub. No gallop.  Pulmonary:     Effort: Pulmonary effort is normal. No respiratory distress.     Breath sounds: Normal breath sounds.  Abdominal:     General: Abdomen is flat. Bowel sounds are normal.     Palpations: Abdomen is soft.  Musculoskeletal:        General: No swelling.     Cervical back: Neck supple.  Lymphadenopathy:     Cervical: No cervical adenopathy.  Skin:    General: Skin is warm and dry.  Neurological:     General: No focal deficit present.     Mental Status: He is alert.  Psychiatric:         Mood and Affect: Mood normal.        Behavior: Behavior normal.        Thought Content: Thought content normal.       No results found for any visits on 10/09/23. Last CBC Lab Results  Component Value Date   WBC 56.9 (HH) 10/06/2023   HGB 10.5 (L) 10/06/2023   HCT 31.5 (L) 10/06/2023   MCV 98.1 10/06/2023   MCH 32.7 10/06/2023   RDW 13.3 10/06/2023   PLT 159 10/06/2023   Last metabolic panel Lab Results  Component Value Date   GLUCOSE 83 10/06/2023   NA 125 (L) 10/06/2023   K 3.8 10/06/2023   CL 92 (L) 10/06/2023   CO2 23 10/06/2023   BUN 8 10/06/2023   CREATININE 0.66 10/06/2023   GFRNONAA >60 10/06/2023   CALCIUM  9.3 10/06/2023   PHOS 2.7 10/03/2013   PROT 6.4 (L) 10/06/2023   ALBUMIN 4.4 10/06/2023   LABGLOB 1.8 10/02/2023   AGRATIO 2.7 (H) 08/08/2022   BILITOT 0.9 10/06/2023   ALKPHOS 52 10/06/2023   AST 35 10/06/2023   ALT 17 10/06/2023   ANIONGAP 10 10/06/2023   Last lipids Lab Results  Component Value Date   CHOL 156 10/02/2023   HDL 99 10/02/2023   LDLCALC 47 10/02/2023   TRIG 41 10/02/2023   CHOLHDL 1.6 10/02/2023   Last hemoglobin A1c Lab Results  Component Value Date   HGBA1C 4.9 10/02/2023   Last thyroid  functions Lab Results  Component Value Date   TSH 2.320 10/02/2023        Assessment & Plan:    Routine Health Maintenance and Physical Exam  Immunization History  Administered Date(s) Administered   Fluad Quad(high Dose 65+) 01/14/2019   Influenza Whole 01/22/2013   Influenza, High Dose Seasonal PF 01/09/2017, 01/06/2018   Influenza,inj,Quad PF,6+ Mos 12/26/2015   Influenza-Unspecified 12/09/2010, 12/27/2013, 12/29/2014   Pneumococcal Conjugate-13 03/08/2015   Pneumococcal Polysaccharide-23 02/12/2010   Rabies, IM 07/23/2023, 07/27/2023, 07/30/2023, 08/06/2023   Tdap 02/13/2011, 07/23/2023   Zoster Recombinant(Shingrix) 03/15/2018, 05/20/2018    Health Maintenance  Topic Date Due   COVID-19 Vaccine (1) Never done    Pneumococcal Vaccine: 50+ Years (3 of 3 - PCV20 or PCV21) 05/03/2015   Colonoscopy  09/08/2018   Medicare Annual Wellness (AWV)  02/08/2023   INFLUENZA VACCINE  10/23/2023   DTaP/Tdap/Td (3 - Td or Tdap) 07/22/2033   Hepatitis C Screening  Completed   Zoster Vaccines- Shingrix  Completed   Hepatitis B Vaccines  Aged Out   HPV VACCINES  Aged Out   Meningococcal B Vaccine  Aged Out    Discussed health benefits of physical activity, and encouraged him to engage in regular exercise appropriate for his age and condition.  Problem List Items Addressed This Visit       Cardiovascular and Mediastinum   Essential hypertension (Chronic)   BP goal <130/80. Stable, essentially at goal in office today. At goal at home. CMP showed mild hyponatremia and hypochloremia that is stable from months past. Cont losartan  50 mg daily. Will cont to monitor.      Relevant Medications   losartan  (COZAAR ) 50 MG tablet   rosuvastatin  (CRESTOR ) 40 MG tablet   Other Relevant Orders   CBC w/Diff     Digestive   GERD (gastroesophageal reflux disease) (Chronic)   Stable.  Continue esomeprazole  20 mg every other day as tolerated, may take every day if needed.       Relevant Medications   esomeprazole  (NEXIUM ) 20 MG capsule     Other   CLL (chronic lymphocytic leukemia) (HCC) (Chronic)   WBC improved greatly at 56.9 (previously >100). Continue regular follow ups with Dr. Timmy. Will cont to monitor and coordinate care with oncology.      Hyperlipidemia, mixed (Chronic)   Last lipid panel: LDL 47, HDL 99, Trig 41. Stable/improved. Continue rosuvastatin  40 mg daily.      Relevant Medications   losartan  (COZAAR ) 50 MG tablet   rosuvastatin  (CRESTOR ) 40 MG tablet   Other Relevant Orders   Lipid Profile   Vitamin D  insufficiency - Primary   Relevant Orders   Vitamin D  (25 hydroxy)   Other Visit Diagnoses       Screening for diabetes mellitus       Relevant Orders   HgB A1c      Return in about  6 months (around 04/10/2024) for HTN, HLD.     Saddie JULIANNA Sacks, PA-C

## 2023-10-09 NOTE — Assessment & Plan Note (Signed)
 Last lipid panel: LDL 47, HDL 99, Trig 41. Stable/improved. Continue rosuvastatin  40 mg daily.

## 2023-10-09 NOTE — Assessment & Plan Note (Signed)
 WBC improved greatly at 56.9 (previously >100). Continue regular follow ups with Dr. Timmy. Will cont to monitor and coordinate care with oncology.

## 2023-10-09 NOTE — Assessment & Plan Note (Signed)
 BP goal <130/80. Stable, essentially at goal in office today. At goal at home. CMP showed mild hyponatremia and hypochloremia that is stable from months past. Cont losartan  50 mg daily. Will cont to monitor.

## 2023-10-10 ENCOUNTER — Other Ambulatory Visit: Payer: Self-pay

## 2023-10-12 ENCOUNTER — Other Ambulatory Visit (HOSPITAL_COMMUNITY): Payer: Self-pay

## 2023-10-27 ENCOUNTER — Other Ambulatory Visit: Payer: Self-pay

## 2023-10-27 ENCOUNTER — Other Ambulatory Visit (HOSPITAL_COMMUNITY): Payer: Self-pay

## 2023-10-30 ENCOUNTER — Other Ambulatory Visit: Payer: Self-pay

## 2023-10-30 ENCOUNTER — Other Ambulatory Visit (HOSPITAL_COMMUNITY): Payer: Self-pay

## 2023-10-30 NOTE — Progress Notes (Signed)
 Specialty Pharmacy Refill Coordination Note  Spoke with George [name] is a 73 y.o. male contacted today regarding refills of specialty medication(s) Zanubrutinib  (Brukinsa )  Doses on hand: 8  Patient requested: Delivery   Delivery date: 11/03/23   Verified address: [address] [place] [address]  Medication will be filled on 11/02/23.

## 2023-11-02 ENCOUNTER — Other Ambulatory Visit: Payer: Self-pay

## 2023-11-03 ENCOUNTER — Encounter: Payer: Self-pay | Admitting: Hematology & Oncology

## 2023-11-03 ENCOUNTER — Inpatient Hospital Stay: Admitting: Hematology & Oncology

## 2023-11-03 ENCOUNTER — Inpatient Hospital Stay: Attending: Hematology & Oncology

## 2023-11-03 ENCOUNTER — Inpatient Hospital Stay

## 2023-11-03 VITALS — BP 136/83 | HR 90 | Temp 98.1°F | Resp 18

## 2023-11-03 VITALS — BP 157/84 | HR 72 | Temp 98.4°F | Resp 17 | Wt 157.8 lb

## 2023-11-03 DIAGNOSIS — Z5112 Encounter for antineoplastic immunotherapy: Secondary | ICD-10-CM | POA: Diagnosis present

## 2023-11-03 DIAGNOSIS — C9110 Chronic lymphocytic leukemia of B-cell type not having achieved remission: Secondary | ICD-10-CM | POA: Insufficient documentation

## 2023-11-03 DIAGNOSIS — Z79620 Long term (current) use of immunosuppressive biologic: Secondary | ICD-10-CM | POA: Diagnosis not present

## 2023-11-03 LAB — CBC WITH DIFFERENTIAL (CANCER CENTER ONLY)
Abs Immature Granulocytes: 0.17 K/uL — ABNORMAL HIGH (ref 0.00–0.07)
Basophils Absolute: 0.2 K/uL — ABNORMAL HIGH (ref 0.0–0.1)
Basophils Relative: 0 %
Eosinophils Absolute: 0.1 K/uL (ref 0.0–0.5)
Eosinophils Relative: 0 %
HCT: 33.2 % — ABNORMAL LOW (ref 39.0–52.0)
Hemoglobin: 11.3 g/dL — ABNORMAL LOW (ref 13.0–17.0)
Immature Granulocytes: 0 %
Lymphocytes Relative: 86 %
Lymphs Abs: 37 K/uL — ABNORMAL HIGH (ref 0.7–4.0)
MCH: 32 pg (ref 26.0–34.0)
MCHC: 34 g/dL (ref 30.0–36.0)
MCV: 94.1 fL (ref 80.0–100.0)
Monocytes Absolute: 0.6 K/uL (ref 0.1–1.0)
Monocytes Relative: 1 %
Neutro Abs: 5.4 K/uL (ref 1.7–7.7)
Neutrophils Relative %: 13 %
Platelet Count: 223 K/uL (ref 150–400)
RBC: 3.53 MIL/uL — ABNORMAL LOW (ref 4.22–5.81)
RDW: 15.1 % (ref 11.5–15.5)
Smear Review: NORMAL
WBC Count: 43.5 K/uL — ABNORMAL HIGH (ref 4.0–10.5)
nRBC: 0 % (ref 0.0–0.2)

## 2023-11-03 LAB — CMP (CANCER CENTER ONLY)
ALT: 22 U/L (ref 0–44)
AST: 38 U/L (ref 15–41)
Albumin: 4.4 g/dL (ref 3.5–5.0)
Alkaline Phosphatase: 96 U/L (ref 38–126)
Anion gap: 12 (ref 5–15)
BUN: 11 mg/dL (ref 8–23)
CO2: 23 mmol/L (ref 22–32)
Calcium: 9.1 mg/dL (ref 8.9–10.3)
Chloride: 94 mmol/L — ABNORMAL LOW (ref 98–111)
Creatinine: 0.73 mg/dL (ref 0.61–1.24)
GFR, Estimated: >60 mL/min (ref >60)
Glucose, Bld: 101 mg/dL — ABNORMAL HIGH (ref 70–99)
Potassium: 4.6 mmol/L (ref 3.5–5.1)
Sodium: 129 mmol/L — ABNORMAL LOW (ref 135–145)
Total Bilirubin: 0.8 mg/dL (ref 0.0–1.2)
Total Protein: 6.5 g/dL (ref 6.5–8.1)

## 2023-11-03 LAB — LACTATE DEHYDROGENASE: LDH: 82 U/L — ABNORMAL LOW (ref 98–192)

## 2023-11-03 LAB — URIC ACID: Uric Acid, Serum: 4 mg/dL (ref 3.7–8.6)

## 2023-11-03 LAB — SAVE SMEAR(SSMR), FOR PROVIDER SLIDE REVIEW

## 2023-11-03 MED ORDER — SODIUM CHLORIDE 0.9 % IV SOLN
20.0000 mg | Freq: Once | INTRAVENOUS | Status: AC
  Administered 2023-11-03 (×2): 20 mg via INTRAVENOUS
  Filled 2023-11-03: qty 20

## 2023-11-03 MED ORDER — DIPHENHYDRAMINE HCL 50 MG/ML IJ SOLN
50.0000 mg | Freq: Once | INTRAMUSCULAR | Status: AC
  Administered 2023-11-03 (×2): 25 mg via INTRAVENOUS
  Filled 2023-11-03: qty 1

## 2023-11-03 MED ORDER — SODIUM CHLORIDE 0.9 % IV SOLN
INTRAVENOUS | Status: DC
Start: 2023-11-03 — End: 2023-11-03

## 2023-11-03 MED ORDER — SODIUM CHLORIDE 0.9 % IV SOLN
1000.0000 mg | Freq: Once | INTRAVENOUS | Status: AC
  Administered 2023-11-03 (×2): 1000 mg via INTRAVENOUS
  Filled 2023-11-03: qty 40

## 2023-11-03 MED ORDER — ACETAMINOPHEN 325 MG PO TABS
650.0000 mg | ORAL_TABLET | Freq: Once | ORAL | Status: AC
  Administered 2023-11-03 (×2): 650 mg via ORAL
  Filled 2023-11-03: qty 2

## 2023-11-03 NOTE — Progress Notes (Signed)
 Hematology and Oncology Follow Up Visit  George [name] [id number] 30-Mar-1950 73 y.o. 11/03/2023   Principle Diagnosis:  CLL-stage C   Current Therapy:        Gazyva/Brukinsa/venetoclax -- s/p cycle #4 - start on 07/14/2023,                              Interim History:  Mr. George [name] is here today for follow-up and treatment.  Overall, he is doing quite well.  He feels nice.  He has had no the attack from the blocks at his rabies vaccine series.  He has had no problems with nausea or vomiting.  He has had no problems with bowels or bladder.  He has been exercising.  He is advised his bike.  He has had no rashes.  There has been no bleeding.  He has had no headache.  He has not noted any swollen lymph nodes.  Overall, I would have said that his performance status is ECOG 0.    Medications:  Allergies as of 11/03/2023       Reactions   Doxycycline Other (See Comments)   Sore throat, red tongue, red skin Patient states that he was also on a camping trip when the symptoms developed, so he is unsure if they were due to the doxycycline or something that he encountered the mountains.        Medication List        Accurate as of November 03, 2023  8:52 AM. If you have any questions, ask your nurse or doctor.          allopurinol 100 MG tablet Commonly known as: Zyloprim Take 1 tablet (100 mg total) by mouth daily.   Brukinsa 80 MG capsule Generic drug: zanubrutinib Take 2 capsules (160 mg total) by mouth 2 (two) times daily.   cholecalciferol 1000 units tablet Commonly known as: VITAMIN D Take 1,000 Units by mouth daily. 2 tablets daily   CO Q 10 PO Take by mouth every morning.   esomeprazole 20 MG capsule Commonly known as: NEXIUM Take 1 capsule (20 mg total) by mouth daily.   famciclovir 500 MG tablet Commonly known as: FAMVIR Take 1 tablet (500 mg total) by mouth daily.   fluorouracil 5 % cream Commonly known as: EFUDEX Apply 1 Application topically  daily.   GLUCOSAMINE HCL PO Take by mouth every morning.   losartan 50 MG tablet Commonly known as: COZAAR Take 1 tablet (50 mg total) by mouth daily.   OVER THE COUNTER MEDICATION 2 (two) times daily. CLEAR- One spray BID.   rosuvastatin 40 MG tablet Commonly known as: CRESTOR Take 1 tablet (40 mg total) by mouth daily.   UNABLE TO FIND SPF 30+ CREAM/LOTION        Allergies:  Allergies  Allergen Reactions   Doxycycline Other (See Comments)    Sore throat, red tongue, red skin  Patient states that he was also on a camping trip when the symptoms developed, so he is unsure if they were due to the doxycycline or something that he encountered the mountains.    Past Medical History, Surgical history, Social history, and Family History were reviewed and updated.  Review of Systems: Review of Systems  Constitutional: Negative.   HENT: Negative.    Eyes: Negative.   Respiratory: Negative.    Cardiovascular: Negative.   Gastrointestinal: Negative.   Genitourinary: Negative.   Musculoskeletal: Negative.   Skin:  Negative.   Neurological: Negative.   Endo/Heme/Allergies: Negative.   Psychiatric/Behavioral: Negative.       Physical Exam:  weight is 157 lb 12.8 oz (71.6 kg). His oral temperature is 98.4 F (36.9 C). His blood pressure is 157/84 (abnormal) and his pulse is 72. His respiration is 17 and oxygen saturation is 100%.   Wt Readings from Last 3 Encounters:  11/03/23 157 lb 12.8 oz (71.6 kg)  10/09/23 158 lb 1.3 oz (71.7 kg)  10/06/23 159 lb 12.8 oz (72.5 kg)    Physical Exam Vitals reviewed.  HENT:     Head: Normocephalic and atraumatic.  Eyes:     Pupils: Pupils are equal, round, and reactive to light.  Cardiovascular:     Rate and Rhythm: Normal rate and regular rhythm.     Heart sounds: Normal heart sounds.  Pulmonary:     Effort: Pulmonary effort is normal.     Breath sounds: Normal breath sounds.  Abdominal:     General: Bowel sounds are  normal.     Palpations: Abdomen is soft.  Musculoskeletal:        General: No tenderness or deformity. Normal range of motion.     Cervical back: Normal range of motion.  Lymphadenopathy:     Cervical: No cervical adenopathy.  Skin:    General: Skin is warm and dry.     Findings: No erythema or rash.  Neurological:     Mental Status: He is alert and oriented to person, place, and time.  Psychiatric:        Behavior: Behavior normal.        Thought Content: Thought content normal.        Judgment: Judgment normal.      Lab Results  Component Value Date   WBC 43.5 (H) 11/03/2023   HGB 11.3 (L) 11/03/2023   HCT 33.2 (L) 11/03/2023   MCV 94.1 11/03/2023   PLT 223 11/03/2023   No results found for: FERRITIN, IRON, TIBC, UIBC, IRONPCTSAT Lab Results  Component Value Date   RETICCTPCT 1.3 03/02/2023   RBC 3.53 (L) 11/03/2023   Lab Results  Component Value Date   KPAFRELGTCHN 45.6 (H) 06/15/2023   LAMBDASER 7.5 06/15/2023   KAPLAMBRATIO 6.08 (H) 06/15/2023   Lab Results  Component Value Date   IGGSERUM 697 06/15/2023   IGA 61 06/15/2023   IGMSERUM 32 06/15/2023   Lab Results  Component Value Date   TOTALPROTELP 6.4 09/19/2021   ALBUMINELP 4.2 09/19/2021   A1GS 0.2 09/19/2021   A2GS 0.6 09/19/2021   BETS 0.8 09/19/2021   BETA2SER 5.0 08/04/2012   GAMS 0.6 09/19/2021   MSPIKE Not Observed 09/19/2021   SPEI * 08/04/2012     Chemistry      Component Value Date/Time   NA 129 (L) 11/03/2023 0754   NA 130 (L) 10/02/2023 0951   K 4.6 11/03/2023 0754   CL 94 (L) 11/03/2023 0754   CO2 23 11/03/2023 0754   BUN 11 11/03/2023 0754   BUN 8 10/02/2023 0951   CREATININE 0.73 11/03/2023 0754   CREATININE 0.73 10/15/2015 0909   GLU 77 02/08/2022 0000      Component Value Date/Time   CALCIUM  9.1 11/03/2023 0754   ALKPHOS 96 11/03/2023 0754   AST 38 11/03/2023 0754   ALT 22 11/03/2023 0754   BILITOT 0.8 11/03/2023 0754       Impression and Plan: Mr.  George [name] is a very pleasant 73 yo caucasian gentleman with CLL  that we had followed for about 17 years.   We are finally starting to see a response.  The white cell counts are starting to come down.  Lymphocytes are also starting to come down slowly but surely.  I still do not think we are ready to start the venetoclax.  We will plan to get back in another month.  Maude JONELLE Crease, MD 8/12/20258:52 AM

## 2023-11-03 NOTE — Patient Instructions (Signed)
 [facility] - A DEPT OF [facility]  Discharge Instructions: Thank you for choosing [facility] to provide your oncology and hematology care.   If you have a lab appointment with the Cancer Center, please go directly to the Cancer Center and check in at the registration area.  Wear comfortable clothing and clothing appropriate for easy access to any Portacath or PICC line.   We strive to give you quality time with your provider. You may need to reschedule your appointment if you arrive late (15 or more minutes).  Arriving late affects you and other patients whose appointments are after yours.  Also, if you miss three or more appointments without notifying the office, you may be dismissed from the clinic at the provider's discretion.      For prescription refill requests, have your pharmacy contact our office and allow 72 hours for refills to be completed.    Today you received the following chemotherapy and/or immunotherapy agents Gazyva      To help prevent nausea and vomiting after your treatment, we encourage you to take your nausea medication as directed.  BELOW ARE SYMPTOMS THAT SHOULD BE REPORTED IMMEDIATELY: *FEVER GREATER THAN 100.4 F (38 C) OR HIGHER *CHILLS OR SWEATING *NAUSEA AND VOMITING THAT IS NOT CONTROLLED WITH YOUR NAUSEA MEDICATION *UNUSUAL SHORTNESS OF BREATH *UNUSUAL BRUISING OR BLEEDING *URINARY PROBLEMS (pain or burning when urinating, or frequent urination) *BOWEL PROBLEMS (unusual diarrhea, constipation, pain near the anus) TENDERNESS IN MOUTH AND THROAT WITH OR WITHOUT PRESENCE OF ULCERS (sore throat, sores in mouth, or a toothache) UNUSUAL RASH, SWELLING OR PAIN  UNUSUAL VAGINAL DISCHARGE OR ITCHING   Items with * indicate a potential emergency and should be followed up as soon as possible or go to the Emergency Department if any problems should occur.  Please show the CHEMOTHERAPY ALERT CARD or IMMUNOTHERAPY ALERT  CARD at check-in to the Emergency Department and triage nurse. Should you have questions after your visit or need to cancel or reschedule your appointment, please contact [facility] - A DEPT OF [facility]  [phone] and follow the prompts.  Office hours are 8:00 a.m. to 4:30 p.m. Monday - Friday. Please note that voicemails left after 4:00 p.m. may not be returned until the following business day.  We are closed weekends and major holidays. You have access to a nurse at all times for urgent questions. Please call the main number to the clinic [phone] and follow the prompts.  For any non-urgent questions, you may also contact your provider using MyChart. We now offer e-Visits for anyone 75 and older to request care online for non-urgent symptoms. For details visit [email or web address].   Also download the MyChart app! Go to the app store, search "MyChart", open the app, select , and log in with your MyChart username and password.

## 2023-11-19 ENCOUNTER — Other Ambulatory Visit: Payer: Self-pay

## 2023-11-19 ENCOUNTER — Other Ambulatory Visit (HOSPITAL_COMMUNITY): Payer: Self-pay

## 2023-11-19 MED ORDER — FLUOROURACIL 5 % EX CREA
1.0000 | TOPICAL_CREAM | Freq: Every day | CUTANEOUS | 0 refills | Status: AC
  Filled 2023-11-19: qty 40, 30d supply, fill #0

## 2023-11-25 ENCOUNTER — Other Ambulatory Visit: Payer: Self-pay

## 2023-11-25 ENCOUNTER — Other Ambulatory Visit (HOSPITAL_COMMUNITY): Payer: Self-pay

## 2023-11-25 NOTE — Progress Notes (Signed)
 Specialty Pharmacy Refill Coordination Note  George [name] is a 73 y.o. male contacted today regarding refills of specialty medication(s) Zanubrutinib  (Brukinsa )   Patient requested Delivery   Delivery date: 11/27/23   Verified address: [address] [place] [address]   Medication will be filled on 11/26/23.

## 2023-11-26 ENCOUNTER — Other Ambulatory Visit: Payer: Self-pay

## 2023-12-01 ENCOUNTER — Inpatient Hospital Stay

## 2023-12-01 ENCOUNTER — Inpatient Hospital Stay: Attending: Hematology & Oncology

## 2023-12-01 ENCOUNTER — Encounter: Payer: Self-pay | Admitting: Hematology & Oncology

## 2023-12-01 ENCOUNTER — Inpatient Hospital Stay (HOSPITAL_BASED_OUTPATIENT_CLINIC_OR_DEPARTMENT_OTHER): Admitting: Hematology & Oncology

## 2023-12-01 VITALS — BP 164/97 | HR 66 | Temp 98.4°F | Resp 20 | Ht 68.0 in | Wt 160.8 lb

## 2023-12-01 VITALS — BP 140/83 | HR 84 | Temp 98.2°F | Resp 18

## 2023-12-01 DIAGNOSIS — C9110 Chronic lymphocytic leukemia of B-cell type not having achieved remission: Secondary | ICD-10-CM | POA: Diagnosis not present

## 2023-12-01 DIAGNOSIS — Z5112 Encounter for antineoplastic immunotherapy: Secondary | ICD-10-CM | POA: Diagnosis present

## 2023-12-01 DIAGNOSIS — Z79620 Long term (current) use of immunosuppressive biologic: Secondary | ICD-10-CM | POA: Insufficient documentation

## 2023-12-01 LAB — CBC WITH DIFFERENTIAL (CANCER CENTER ONLY)
Abs Immature Granulocytes: 0.15 K/uL — ABNORMAL HIGH (ref 0.00–0.07)
Basophils Absolute: 0.1 K/uL (ref 0.0–0.1)
Basophils Relative: 0 %
Eosinophils Absolute: 0.1 K/uL (ref 0.0–0.5)
Eosinophils Relative: 0 %
HCT: 34.9 % — ABNORMAL LOW (ref 39.0–52.0)
Hemoglobin: 11.5 g/dL — ABNORMAL LOW (ref 13.0–17.0)
Immature Granulocytes: 1 %
Lymphocytes Relative: 84 %
Lymphs Abs: 24.8 K/uL — ABNORMAL HIGH (ref 0.7–4.0)
MCH: 31.2 pg (ref 26.0–34.0)
MCHC: 33 g/dL (ref 30.0–36.0)
MCV: 94.6 fL (ref 80.0–100.0)
Monocytes Absolute: 0.7 K/uL (ref 0.1–1.0)
Monocytes Relative: 2 %
Neutro Abs: 3.7 K/uL (ref 1.7–7.7)
Neutrophils Relative %: 13 %
Platelet Count: 261 K/uL (ref 150–400)
RBC: 3.69 MIL/uL — ABNORMAL LOW (ref 4.22–5.81)
RDW: 16 % — ABNORMAL HIGH (ref 11.5–15.5)
Smear Review: NORMAL
WBC Count: 29.7 K/uL — ABNORMAL HIGH (ref 4.0–10.5)
nRBC: 0.1 % (ref 0.0–0.2)

## 2023-12-01 LAB — CMP (CANCER CENTER ONLY)
ALT: 15 U/L (ref 0–44)
AST: 36 U/L (ref 15–41)
Albumin: 4.5 g/dL (ref 3.5–5.0)
Alkaline Phosphatase: 73 U/L (ref 38–126)
Anion gap: 14 (ref 5–15)
BUN: 7 mg/dL — ABNORMAL LOW (ref 8–23)
CO2: 22 mmol/L (ref 22–32)
Calcium: 9.1 mg/dL (ref 8.9–10.3)
Chloride: 97 mmol/L — ABNORMAL LOW (ref 98–111)
Creatinine: 0.71 mg/dL (ref 0.61–1.24)
GFR, Estimated: >60 mL/min (ref >60)
Glucose, Bld: 93 mg/dL (ref 70–99)
Potassium: 3.9 mmol/L (ref 3.5–5.1)
Sodium: 134 mmol/L — ABNORMAL LOW (ref 135–145)
Total Bilirubin: 0.6 mg/dL (ref 0.0–1.2)
Total Protein: 6.6 g/dL (ref 6.5–8.1)

## 2023-12-01 LAB — SAVE SMEAR(SSMR), FOR PROVIDER SLIDE REVIEW

## 2023-12-01 LAB — LACTATE DEHYDROGENASE: LDH: 125 U/L (ref 98–192)

## 2023-12-01 MED ORDER — SODIUM CHLORIDE 0.9 % IV SOLN
1000.0000 mg | Freq: Once | INTRAVENOUS | Status: AC
  Administered 2023-12-01: 1000 mg via INTRAVENOUS
  Filled 2023-12-01: qty 40

## 2023-12-01 MED ORDER — DIPHENHYDRAMINE HCL 50 MG/ML IJ SOLN
25.0000 mg | Freq: Once | INTRAMUSCULAR | Status: AC
  Administered 2023-12-01: 25 mg via INTRAVENOUS
  Filled 2023-12-01: qty 1

## 2023-12-01 MED ORDER — SODIUM CHLORIDE 0.9 % IV SOLN
20.0000 mg | Freq: Once | INTRAVENOUS | Status: AC
  Administered 2023-12-01: 20 mg via INTRAVENOUS
  Filled 2023-12-01: qty 20

## 2023-12-01 MED ORDER — ACETAMINOPHEN 325 MG PO TABS
650.0000 mg | ORAL_TABLET | Freq: Once | ORAL | Status: AC
  Administered 2023-12-01: 650 mg via ORAL
  Filled 2023-12-01: qty 2

## 2023-12-01 MED ORDER — SODIUM CHLORIDE 0.9 % IV SOLN: INTRAVENOUS | Status: DC

## 2023-12-01 NOTE — Progress Notes (Signed)
 Hematology and Oncology Follow Up Visit  EXCELL NEYLAND [id number] [birth year] 73 y.o. 12/01/2023   Principle Diagnosis:  CLL-stage C   Current Therapy:        Gazyva /Brukinsa /venetoclax -- s/p cycle #5 - start on 07/14/2023,                              Interim History:  Mr. Gardella is here today for follow-up and treatment.  He really looks quite good.  He has responded incredibly well to treatment so far.  His white cell count is coming down nicely.  His percentage of lymphocytes is also coming down.  He has had no side effect so far.  He has had no nausea or vomiting.  He has had no diarrhea.  He has had no bleeding.  He has had no leg swelling.  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  He puts on some cream for this.  He has had no fever.  He has had no mouth sores.  I is started the venetoclax.  I would like to have his white cell count down below 20,000 before I do this.  Overall, I would say that his performance status probably ECOG 0.     Medications:  Allergies as of 12/01/2023       Reactions   Doxycycline  Other (See Comments)   Sore throat, red tongue, red skin Patient states that he was also on a camping trip when the symptoms developed, so he is unsure if they were due to the doxycycline  or something that he encountered the mountains.        Medication List        Accurate as of December 01, 2023 10:09 AM. If you have any questions, ask your nurse or doctor.          STOP taking these medications    allopurinol  100 MG tablet Commonly known as: Zyloprim  Stopped by: Maude JONELLE Crease       TAKE these medications    Brukinsa  80 MG capsule Generic drug: zanubrutinib  Take 2 capsules (160 mg total) by mouth 2 (two) times daily.   cholecalciferol 1000 units tablet Commonly known as: VITAMIN D  Take 1,000 Units by mouth daily. 2 tablets daily   CO Q 10 PO Take by mouth every morning.   esomeprazole  20 MG capsule Commonly known as:  NEXIUM  Take 1 capsule (20 mg total) by mouth daily.   famciclovir  500 MG tablet Commonly known as: FAMVIR  Take 1 tablet (500 mg total) by mouth daily.   fluorouracil  5 % cream Commonly known as: EFUDEX  Apply topically once daily as directed.   GLUCOSAMINE HCL PO Take by mouth every morning.   losartan  50 MG tablet Commonly known as: COZAAR  Take 1 tablet (50 mg total) by mouth daily.   OVER THE COUNTER MEDICATION 2 (two) times daily. CLEAR- One spray BID.   rosuvastatin  40 MG tablet Commonly known as: CRESTOR  Take 1 tablet (40 mg total) by mouth daily.   UNABLE TO FIND SPF 30+ CREAM/LOTION        Allergies:  Allergies  Allergen Reactions   Doxycycline  Other (See Comments)    Sore throat, red tongue, red skin  Patient states that he was also on a camping trip when the symptoms developed, so he is unsure if they were due to the doxycycline  or something that he encountered the mountains.    Past Medical History, Surgical history,  Social history, and Family History were reviewed and updated.  Review of Systems: Review of Systems  Constitutional: Negative.   HENT: Negative.    Eyes: Negative.   Respiratory: Negative.    Cardiovascular: Negative.   Gastrointestinal: Negative.   Genitourinary: Negative.   Musculoskeletal: Negative.   Skin: Negative.   Neurological: Negative.   Endo/Heme/Allergies: Negative.   Psychiatric/Behavioral: Negative.       Physical Exam:  height is 5' 8 (1.727 m) and weight is 160 lb 12.8 oz (72.9 kg). His oral temperature is 98.4 F (36.9 C). His blood pressure is 164/97 (abnormal) and his pulse is 66. His respiration is 20 and oxygen saturation is 100%.   Wt Readings from Last 3 Encounters:  12/01/23 160 lb 12.8 oz (72.9 kg)  11/03/23 157 lb 12.8 oz (71.6 kg)  10/09/23 158 lb 1.3 oz (71.7 kg)    Physical Exam Vitals reviewed.  HENT:     Head: Normocephalic and atraumatic.  Eyes:     Pupils: Pupils are equal, round, and  reactive to light.  Cardiovascular:     Rate and Rhythm: Normal rate and regular rhythm.     Heart sounds: Normal heart sounds.  Pulmonary:     Effort: Pulmonary effort is normal.     Breath sounds: Normal breath sounds.  Abdominal:     General: Bowel sounds are normal.     Palpations: Abdomen is soft.  Musculoskeletal:        General: No tenderness or deformity. Normal range of motion.     Cervical back: Normal range of motion.  Lymphadenopathy:     Cervical: No cervical adenopathy.  Skin:    General: Skin is warm and dry.     Findings: No erythema or rash.  Neurological:     Mental Status: He is alert and oriented to person, place, and time.  Psychiatric:        Behavior: Behavior normal.        Thought Content: Thought content normal.        Judgment: Judgment normal.      Lab Results  Component Value Date   WBC 29.7 (H) 12/01/2023   HGB 11.5 (L) 12/01/2023   HCT 34.9 (L) 12/01/2023   MCV 94.6 12/01/2023   PLT 261 12/01/2023   No results found for: FERRITIN, IRON, TIBC, UIBC, IRONPCTSAT Lab Results  Component Value Date   RETICCTPCT 1.3 03/02/2023   RBC 3.69 (L) 12/01/2023   Lab Results  Component Value Date   KPAFRELGTCHN 45.6 (H) 06/15/2023   LAMBDASER 7.5 06/15/2023   KAPLAMBRATIO 6.08 (H) 06/15/2023   Lab Results  Component Value Date   IGGSERUM 697 06/15/2023   IGA 61 06/15/2023   IGMSERUM 32 06/15/2023   Lab Results  Component Value Date   TOTALPROTELP 6.4 09/19/2021   ALBUMINELP 4.2 09/19/2021   A1GS 0.2 09/19/2021   A2GS 0.6 09/19/2021   BETS 0.8 09/19/2021   BETA2SER 5.0 08/04/2012   GAMS 0.6 09/19/2021   MSPIKE Not Observed 09/19/2021   SPEI * 08/04/2012     Chemistry      Component Value Date/Time   NA 134 (L) 12/01/2023 0854   NA 130 (L) 10/02/2023 0951   K 3.9 12/01/2023 0854   CL 97 (L) 12/01/2023 0854   CO2 22 12/01/2023 0854   BUN 7 (L) 12/01/2023 0854   BUN 8 10/02/2023 0951   CREATININE 0.71 12/01/2023 0854    CREATININE 0.73 10/15/2015 0909   GLU 77 02/08/2022  0000      Component Value Date/Time   CALCIUM  9.1 12/01/2023 0854   ALKPHOS 73 12/01/2023 0854   AST 36 12/01/2023 0854   ALT 15 12/01/2023 0854   BILITOT 0.6 12/01/2023 0854       Impression and Plan: Mr. Banks is a very pleasant 73 yo caucasian gentleman with CLL that we had followed for about 17 years.   Again, he is responding.  As expected, everything is coming down.  His hemoglobin is coming up.  His platelets are coming up.  We will go ahead with his Gazyva .  I will give him a total of 8 cycles of Gazyva .  Once we get the white cell count down below 20,000, then I will add the venetoclax.  We will get him back in 1 month.  Maude JONELLE Crease, MD 9/9/202510:09 AM

## 2023-12-01 NOTE — Progress Notes (Signed)
  BP remains elevated, 164/97, instructed to monitor daily and notify PCP if it remains over 140/90. Verbalized understanding.

## 2023-12-01 NOTE — Patient Instructions (Signed)
 [facility] - A DEPT OF [facility]  Discharge Instructions: Thank you for choosing [facility] to provide your oncology and hematology care.   If you have a lab appointment with the Cancer Center, please go directly to the Cancer Center and check in at the registration area.  Wear comfortable clothing and clothing appropriate for easy access to any Portacath or PICC line.   We strive to give you quality time with your provider. You may need to reschedule your appointment if you arrive late (15 or more minutes).  Arriving late affects you and other patients whose appointments are after yours.  Also, if you miss three or more appointments without notifying the office, you may be dismissed from the clinic at the provider's discretion.      For prescription refill requests, have your pharmacy contact our office and allow 72 hours for refills to be completed.    Today you received the following chemotherapy and/or immunotherapy agents Gazyva      To help prevent nausea and vomiting after your treatment, we encourage you to take your nausea medication as directed.  BELOW ARE SYMPTOMS THAT SHOULD BE REPORTED IMMEDIATELY: *FEVER GREATER THAN 100.4 F (38 C) OR HIGHER *CHILLS OR SWEATING *NAUSEA AND VOMITING THAT IS NOT CONTROLLED WITH YOUR NAUSEA MEDICATION *UNUSUAL SHORTNESS OF BREATH *UNUSUAL BRUISING OR BLEEDING *URINARY PROBLEMS (pain or burning when urinating, or frequent urination) *BOWEL PROBLEMS (unusual diarrhea, constipation, pain near the anus) TENDERNESS IN MOUTH AND THROAT WITH OR WITHOUT PRESENCE OF ULCERS (sore throat, sores in mouth, or a toothache) UNUSUAL RASH, SWELLING OR PAIN  UNUSUAL VAGINAL DISCHARGE OR ITCHING   Items with * indicate a potential emergency and should be followed up as soon as possible or go to the Emergency Department if any problems should occur.  Please show the CHEMOTHERAPY ALERT CARD or IMMUNOTHERAPY ALERT  CARD at check-in to the Emergency Department and triage nurse. Should you have questions after your visit or need to cancel or reschedule your appointment, please contact [facility] - A DEPT OF [facility]  [phone] and follow the prompts.  Office hours are 8:00 a.m. to 4:30 p.m. Monday - Friday. Please note that voicemails left after 4:00 p.m. may not be returned until the following business day.  We are closed weekends and major holidays. You have access to a nurse at all times for urgent questions. Please call the main number to the clinic [phone] and follow the prompts.  For any non-urgent questions, you may also contact your provider using MyChart. We now offer e-Visits for anyone 75 and older to request care online for non-urgent symptoms. For details visit [email or web address].   Also download the MyChart app! Go to the app store, search "MyChart", open the app, select , and log in with your MyChart username and password.

## 2023-12-16 ENCOUNTER — Other Ambulatory Visit: Payer: Self-pay

## 2023-12-16 ENCOUNTER — Other Ambulatory Visit (HOSPITAL_COMMUNITY): Payer: Self-pay

## 2023-12-16 NOTE — Progress Notes (Signed)
 Specialty Pharmacy Ongoing Clinical Assessment Note  George [name] is a 73 y.o. male who is being followed by the specialty pharmacy service for RxSp Oncology   Patient's specialty medication(s) reviewed today: Zanubrutinib  (Brukinsa )   Missed doses in the last 4 weeks: 0   Patient/Caregiver did not have any additional questions or concerns.   Therapeutic benefit summary: Patient is achieving benefit   Adverse events/side effects summary: No adverse events/side effects   Patient's therapy is appropriate to: Continue    Goals Addressed             This Visit's Progress    Slow Disease Progression   On track    Patient is on track. Patient will maintain adherence. Per Dr. Timmy 12/01/23 patient's white cell count and lymphocytes are coming down nicely.        Follow up: 3 months  Powell CHRISTELLA Gallus Specialty Pharmacist

## 2023-12-29 ENCOUNTER — Inpatient Hospital Stay: Admitting: Hematology & Oncology

## 2023-12-29 ENCOUNTER — Other Ambulatory Visit: Payer: Self-pay

## 2023-12-29 ENCOUNTER — Inpatient Hospital Stay

## 2023-12-29 ENCOUNTER — Inpatient Hospital Stay: Attending: Hematology & Oncology

## 2023-12-29 VITALS — BP 151/81 | HR 79 | Temp 98.4°F | Resp 18 | Ht 68.0 in | Wt 158.0 lb

## 2023-12-29 VITALS — BP 127/88 | HR 77 | Temp 98.4°F | Resp 20

## 2023-12-29 DIAGNOSIS — C9110 Chronic lymphocytic leukemia of B-cell type not having achieved remission: Secondary | ICD-10-CM

## 2023-12-29 DIAGNOSIS — Z5112 Encounter for antineoplastic immunotherapy: Secondary | ICD-10-CM | POA: Insufficient documentation

## 2023-12-29 DIAGNOSIS — D649 Anemia, unspecified: Secondary | ICD-10-CM | POA: Insufficient documentation

## 2023-12-29 DIAGNOSIS — Z79620 Long term (current) use of immunosuppressive biologic: Secondary | ICD-10-CM | POA: Insufficient documentation

## 2023-12-29 LAB — CMP (CANCER CENTER ONLY)
ALT: 28 U/L (ref 0–44)
AST: 49 U/L — ABNORMAL HIGH (ref 15–41)
Albumin: 4.5 g/dL (ref 3.5–5.0)
Alkaline Phosphatase: 77 U/L (ref 38–126)
Anion gap: 13 (ref 5–15)
BUN: 7 mg/dL — ABNORMAL LOW (ref 8–23)
CO2: 23 mmol/L (ref 22–32)
Calcium: 9.5 mg/dL (ref 8.9–10.3)
Chloride: 99 mmol/L (ref 98–111)
Creatinine: 0.81 mg/dL (ref 0.61–1.24)
GFR, Estimated: >60 mL/min (ref >60)
Glucose, Bld: 116 mg/dL — ABNORMAL HIGH (ref 70–99)
Potassium: 3.8 mmol/L (ref 3.5–5.1)
Sodium: 135 mmol/L (ref 135–145)
Total Bilirubin: 0.6 mg/dL (ref 0.0–1.2)
Total Protein: 6.6 g/dL (ref 6.5–8.1)

## 2023-12-29 LAB — CBC WITH DIFFERENTIAL (CANCER CENTER ONLY)
Abs Immature Granulocytes: 0.06 K/uL (ref 0.00–0.07)
Basophils Absolute: 0.1 K/uL (ref 0.0–0.1)
Basophils Relative: 0 %
Eosinophils Absolute: 0.1 K/uL (ref 0.0–0.5)
Eosinophils Relative: 0 %
HCT: 35.4 % — ABNORMAL LOW (ref 39.0–52.0)
Hemoglobin: 11.7 g/dL — ABNORMAL LOW (ref 13.0–17.0)
Immature Granulocytes: 0 %
Lymphocytes Relative: 76 %
Lymphs Abs: 16.6 K/uL — ABNORMAL HIGH (ref 0.7–4.0)
MCH: 30.4 pg (ref 26.0–34.0)
MCHC: 33.1 g/dL (ref 30.0–36.0)
MCV: 91.9 fL (ref 80.0–100.0)
Monocytes Absolute: 0.6 K/uL (ref 0.1–1.0)
Monocytes Relative: 3 %
Neutro Abs: 4.6 K/uL (ref 1.7–7.7)
Neutrophils Relative %: 21 %
Platelet Count: 230 K/uL (ref 150–400)
RBC: 3.85 MIL/uL — ABNORMAL LOW (ref 4.22–5.81)
RDW: 16.3 % — ABNORMAL HIGH (ref 11.5–15.5)
Smear Review: NORMAL
WBC Count: 22.1 K/uL — ABNORMAL HIGH (ref 4.0–10.5)
nRBC: 0 % (ref 0.0–0.2)

## 2023-12-29 LAB — SAVE SMEAR(SSMR), FOR PROVIDER SLIDE REVIEW

## 2023-12-29 LAB — LACTATE DEHYDROGENASE: LDH: 130 U/L (ref 98–192)

## 2023-12-29 MED ORDER — ACETAMINOPHEN 325 MG PO TABS
650.0000 mg | ORAL_TABLET | Freq: Once | ORAL | Status: AC
  Administered 2023-12-29: 650 mg via ORAL
  Filled 2023-12-29: qty 2

## 2023-12-29 MED ORDER — SODIUM CHLORIDE 0.9 % IV SOLN
20.0000 mg | Freq: Once | INTRAVENOUS | Status: AC
  Administered 2023-12-29: 20 mg via INTRAVENOUS
  Filled 2023-12-29: qty 20

## 2023-12-29 MED ORDER — DIPHENHYDRAMINE HCL 50 MG/ML IJ SOLN
25.0000 mg | Freq: Once | INTRAMUSCULAR | Status: AC
  Administered 2023-12-29: 25 mg via INTRAVENOUS
  Filled 2023-12-29: qty 1

## 2023-12-29 MED ORDER — SODIUM CHLORIDE 0.9 % IV SOLN
1000.0000 mg | Freq: Once | INTRAVENOUS | Status: AC
  Administered 2023-12-29: 1000 mg via INTRAVENOUS
  Filled 2023-12-29: qty 40

## 2023-12-29 MED ORDER — SODIUM CHLORIDE 0.9 % IV SOLN: INTRAVENOUS | Status: DC

## 2023-12-29 NOTE — Progress Notes (Signed)
 Hematology and Oncology Follow Up Visit  MCKALE HAFFEY [id number] 07/14/1950 73 y.o. 12/29/2023   Principle Diagnosis:  CLL-stage C   Current Therapy:        Gazyva /Brukinsa /venetoclax -- s/p cycle #6 - start on 07/14/2023,                              Interim History:  Mr. George [name] is here today for follow-up and treatment.  Overall, he continues to improve.  I am so happy for him.  He says he has little more energy and stamina.  Hopefully this is because his anemia is improving.  He has had a good appetite.  He has had no problems with fever.  He has had no nausea or vomiting.  There has been no cough.  He has had no rashes.  There has been no bleeding.  He has had no change in bowel or bladder habits.  His last CT scan was done actually back in April.  We probably need to get him set up with another scan to see how things look.  The problem that he has now is his poor wife really hurt her knee.  She is on crutches.  It sounds like she may have torn something.  I know that she sees an orthopedist tomorrow.  Hopefully everything will go well.  Overall, I would have to say that his performance status is probably ECOG 0.    Medications:  Allergies as of 12/29/2023       Reactions   Doxycycline  Other (See Comments)   Sore throat, red tongue, red skin Patient states that he was also on a camping trip when the symptoms developed, so he is unsure if they were due to the doxycycline  or something that he encountered the mountains.        Medication List        Accurate as of December 29, 2023  8:02 AM. If you have any questions, ask your nurse or doctor.          Brukinsa  80 MG capsule Generic drug: zanubrutinib  Take 2 capsules (160 mg total) by mouth 2 (two) times daily.   cholecalciferol 1000 units tablet Commonly known as: VITAMIN D  Take 1,000 Units by mouth daily. 2 tablets daily   CO Q 10 PO Take by mouth every morning.   esomeprazole  20 MG capsule Commonly known  as: NEXIUM  Take 1 capsule (20 mg total) by mouth daily.   famciclovir  500 MG tablet Commonly known as: FAMVIR  Take 1 tablet (500 mg total) by mouth daily.   fluorouracil  5 % cream Commonly known as: EFUDEX  Apply topically once daily as directed.   GLUCOSAMINE HCL PO Take by mouth every morning.   losartan  50 MG tablet Commonly known as: COZAAR  Take 1 tablet (50 mg total) by mouth daily.   OVER THE COUNTER MEDICATION 2 (two) times daily. CLEAR- One spray BID.   rosuvastatin  40 MG tablet Commonly known as: CRESTOR  Take 1 tablet (40 mg total) by mouth daily.   UNABLE TO FIND SPF 30+ CREAM/LOTION        Allergies:  Allergies  Allergen Reactions   Doxycycline  Other (See Comments)    Sore throat, red tongue, red skin  Patient states that he was also on a camping trip when the symptoms developed, so he is unsure if they were due to the doxycycline  or something that he encountered the mountains.    Past  Medical History, Surgical history, Social history, and Family History were reviewed and updated.  Review of Systems: Review of Systems  Constitutional: Negative.   HENT: Negative.    Eyes: Negative.   Respiratory: Negative.    Cardiovascular: Negative.   Gastrointestinal: Negative.   Genitourinary: Negative.   Musculoskeletal: Negative.   Skin: Negative.   Neurological: Negative.   Endo/Heme/Allergies: Negative.   Psychiatric/Behavioral: Negative.       Physical Exam:  Vital signs show temperature of 98.4.  Pulse 79.  Blood pressure 151/81.  Weight is 158 pounds.  Wt Readings from Last 3 Encounters:  12/01/23 160 lb 12.8 oz (72.9 kg)  11/03/23 157 lb 12.8 oz (71.6 kg)  10/09/23 158 lb 1.3 oz (71.7 kg)    Physical Exam Vitals reviewed.  HENT:     Head: Normocephalic and atraumatic.  Eyes:     Pupils: Pupils are equal, round, and reactive to light.  Cardiovascular:     Rate and Rhythm: Normal rate and regular rhythm.     Heart sounds: Normal heart  sounds.  Pulmonary:     Effort: Pulmonary effort is normal.     Breath sounds: Normal breath sounds.  Abdominal:     General: Bowel sounds are normal.     Palpations: Abdomen is soft.  Musculoskeletal:        General: No tenderness or deformity. Normal range of motion.     Cervical back: Normal range of motion.  Lymphadenopathy:     Cervical: No cervical adenopathy.  Skin:    General: Skin is warm and dry.     Findings: No erythema or rash.  Neurological:     Mental Status: He is alert and oriented to person, place, and time.  Psychiatric:        Behavior: Behavior normal.        Thought Content: Thought content normal.        Judgment: Judgment normal.      Lab Results  Component Value Date   WBC 22.1 (H) 12/29/2023   HGB 11.7 (L) 12/29/2023   HCT 35.4 (L) 12/29/2023   MCV 91.9 12/29/2023   PLT 230 12/29/2023   No results found for: FERRITIN, IRON, TIBC, UIBC, IRONPCTSAT Lab Results  Component Value Date   RETICCTPCT 1.3 03/02/2023   RBC 3.85 (L) 12/29/2023   Lab Results  Component Value Date   KPAFRELGTCHN 45.6 (H) 06/15/2023   LAMBDASER 7.5 06/15/2023   KAPLAMBRATIO 6.08 (H) 06/15/2023   Lab Results  Component Value Date   IGGSERUM 697 06/15/2023   IGA 61 06/15/2023   IGMSERUM 32 06/15/2023   Lab Results  Component Value Date   TOTALPROTELP 6.4 09/19/2021   ALBUMINELP 4.2 09/19/2021   A1GS 0.2 09/19/2021   A2GS 0.6 09/19/2021   BETS 0.8 09/19/2021   BETA2SER 5.0 08/04/2012   GAMS 0.6 09/19/2021   MSPIKE Not Observed 09/19/2021   SPEI * 08/04/2012     Chemistry      Component Value Date/Time   NA 134 (L) 12/01/2023 0854   NA 130 (L) 10/02/2023 0951   K 3.9 12/01/2023 0854   CL 97 (L) 12/01/2023 0854   CO2 22 12/01/2023 0854   BUN 7 (L) 12/01/2023 0854   BUN 8 10/02/2023 0951   CREATININE 0.71 12/01/2023 0854   CREATININE 0.73 10/15/2015 0909   GLU 77 02/08/2022 0000      Component Value Date/Time   CALCIUM  9.1 12/01/2023 0854    ALKPHOS 73 12/01/2023 0854  AST 36 12/01/2023 0854   ALT 15 12/01/2023 0854   BILITOT 0.6 12/01/2023 0854       Impression and Plan: Mr. Furukawa is a very pleasant 73 yo caucasian gentleman with CLL that we had followed for about 17 years.   Again, he is responding.  As expected, his white cell count is coming down.  I cannot palpate any adenopathy on his exam.  Once the white cell count gets below 20,000, then we will start venetoclax.  He has 1 more cycle of the Gazyva  after this.  So far, everything is going according to plan.  He is responding as we thought.  Will plan to get him back in 1 more month.    Maude JONELLE Crease, MD 10/7/20258:02 AM

## 2023-12-31 ENCOUNTER — Other Ambulatory Visit: Payer: Self-pay

## 2023-12-31 ENCOUNTER — Other Ambulatory Visit (HOSPITAL_COMMUNITY): Payer: Self-pay

## 2023-12-31 NOTE — Progress Notes (Signed)
 Specialty Pharmacy Refill Coordination Note  George [name] is a 73 y.o. male contacted today regarding refills of specialty medication(s) Zanubrutinib  (Brukinsa )   Patient requested Delivery   Delivery date: 01/04/24   Verified address: [address] [place] [address]   Medication will be filled on 10.10.25.

## 2024-01-02 ENCOUNTER — Other Ambulatory Visit (HOSPITAL_COMMUNITY): Payer: Self-pay

## 2024-01-05 ENCOUNTER — Other Ambulatory Visit: Payer: Self-pay

## 2024-01-12 ENCOUNTER — Other Ambulatory Visit (HOSPITAL_COMMUNITY): Payer: Self-pay

## 2024-01-13 ENCOUNTER — Other Ambulatory Visit (HOSPITAL_COMMUNITY): Payer: Self-pay

## 2024-01-18 ENCOUNTER — Encounter (HOSPITAL_BASED_OUTPATIENT_CLINIC_OR_DEPARTMENT_OTHER): Payer: Self-pay

## 2024-01-18 ENCOUNTER — Ambulatory Visit (HOSPITAL_BASED_OUTPATIENT_CLINIC_OR_DEPARTMENT_OTHER)
Admission: RE | Admit: 2024-01-18 | Discharge: 2024-01-18 | Disposition: A | Source: Ambulatory Visit | Attending: Hematology & Oncology | Admitting: Hematology & Oncology

## 2024-01-18 ENCOUNTER — Ambulatory Visit (HOSPITAL_BASED_OUTPATIENT_CLINIC_OR_DEPARTMENT_OTHER)
Admission: RE | Admit: 2024-01-18 | Discharge: 2024-01-18 | Disposition: A | Discharge status: Home / Self Care | Source: Ambulatory Visit | Attending: Hematology & Oncology | Admitting: Hematology & Oncology

## 2024-01-18 DIAGNOSIS — C9110 Chronic lymphocytic leukemia of B-cell type not having achieved remission: Secondary | ICD-10-CM

## 2024-01-18 MED ORDER — IOHEXOL 300 MG/ML  SOLN
100.0000 mL | Freq: Once | INTRAMUSCULAR | Status: AC | PRN
  Administered 2024-01-18: 100 mL via INTRAVENOUS

## 2024-01-19 ENCOUNTER — Ambulatory Visit: Payer: Self-pay | Admitting: Hematology & Oncology

## 2024-01-19 NOTE — Telephone Encounter (Signed)
 Advised via MyChart.

## 2024-01-19 NOTE — Telephone Encounter (Signed)
-----   Message from Maude JONELLE Crease sent at 01/19/2024  6:47 AM EDT ----- Please call-as expected, all of his lymph nodes have resolved.  I am not surprised.  Please let him know.  He does have some small indeterminate lung nodules.  I have to believe these are more  inflammatory than anything else.  Thanks.  Jeralyn ----- Message ----- From: Rebecka, Rad Results In Sent: 01/19/2024   4:10 AM EDT To: Maude JONELLE Crease, MD

## 2024-01-22 ENCOUNTER — Other Ambulatory Visit: Payer: Self-pay

## 2024-01-25 ENCOUNTER — Other Ambulatory Visit (HOSPITAL_COMMUNITY): Payer: Self-pay

## 2024-01-26 ENCOUNTER — Inpatient Hospital Stay

## 2024-01-26 ENCOUNTER — Telehealth: Payer: Self-pay

## 2024-01-26 ENCOUNTER — Other Ambulatory Visit (HOSPITAL_COMMUNITY): Payer: Self-pay

## 2024-01-26 ENCOUNTER — Inpatient Hospital Stay: Attending: Hematology & Oncology

## 2024-01-26 ENCOUNTER — Inpatient Hospital Stay: Admitting: Hematology & Oncology

## 2024-01-26 ENCOUNTER — Encounter: Payer: Self-pay | Admitting: Hematology & Oncology

## 2024-01-26 ENCOUNTER — Other Ambulatory Visit: Payer: Self-pay

## 2024-01-26 ENCOUNTER — Telehealth: Payer: Self-pay | Admitting: Pharmacist

## 2024-01-26 VITALS — BP 135/93 | HR 108 | Temp 98.4°F | Resp 20

## 2024-01-26 VITALS — BP 158/92 | HR 81 | Temp 98.0°F | Resp 20 | Ht 68.0 in | Wt 162.1 lb

## 2024-01-26 DIAGNOSIS — Z5112 Encounter for antineoplastic immunotherapy: Secondary | ICD-10-CM | POA: Insufficient documentation

## 2024-01-26 DIAGNOSIS — C9110 Chronic lymphocytic leukemia of B-cell type not having achieved remission: Secondary | ICD-10-CM

## 2024-01-26 DIAGNOSIS — Z79620 Long term (current) use of immunosuppressive biologic: Secondary | ICD-10-CM | POA: Diagnosis not present

## 2024-01-26 LAB — CMP (CANCER CENTER ONLY)
ALT: 24 U/L (ref 0–44)
AST: 46 U/L — ABNORMAL HIGH (ref 15–41)
Albumin: 4.5 g/dL (ref 3.5–5.0)
Alkaline Phosphatase: 78 U/L (ref 38–126)
Anion gap: 12 (ref 5–15)
BUN: 8 mg/dL (ref 8–23)
CO2: 24 mmol/L (ref 22–32)
Calcium: 9 mg/dL (ref 8.9–10.3)
Chloride: 94 mmol/L — ABNORMAL LOW (ref 98–111)
Creatinine: 0.7 mg/dL (ref 0.61–1.24)
GFR, Estimated: >60 mL/min (ref >60)
Glucose, Bld: 137 mg/dL — ABNORMAL HIGH (ref 70–99)
Potassium: 4.7 mmol/L (ref 3.5–5.1)
Sodium: 130 mmol/L — ABNORMAL LOW (ref 135–145)
Total Bilirubin: 0.6 mg/dL (ref 0.0–1.2)
Total Protein: 6.6 g/dL (ref 6.5–8.1)

## 2024-01-26 LAB — CBC WITH DIFFERENTIAL (CANCER CENTER ONLY)
Abs Immature Granulocytes: 0.08 K/uL — ABNORMAL HIGH (ref 0.00–0.07)
Basophils Absolute: 0.1 K/uL (ref 0.0–0.1)
Basophils Relative: 1 %
Eosinophils Absolute: 0 K/uL (ref 0.0–0.5)
Eosinophils Relative: 0 %
HCT: 37.3 % — ABNORMAL LOW (ref 39.0–52.0)
Hemoglobin: 12.4 g/dL — ABNORMAL LOW (ref 13.0–17.0)
Immature Granulocytes: 0 %
Lymphocytes Relative: 73 %
Lymphs Abs: 13.4 K/uL — ABNORMAL HIGH (ref 0.7–4.0)
MCH: 30 pg (ref 26.0–34.0)
MCHC: 33.2 g/dL (ref 30.0–36.0)
MCV: 90.3 fL (ref 80.0–100.0)
Monocytes Absolute: 0.9 K/uL (ref 0.1–1.0)
Monocytes Relative: 5 %
Neutro Abs: 3.9 K/uL (ref 1.7–7.7)
Neutrophils Relative %: 21 %
Platelet Count: 232 K/uL (ref 150–400)
RBC: 4.13 MIL/uL — ABNORMAL LOW (ref 4.22–5.81)
RDW: 14.7 % (ref 11.5–15.5)
Smear Review: NORMAL
WBC Count: 18.4 K/uL — ABNORMAL HIGH (ref 4.0–10.5)
nRBC: 0 % (ref 0.0–0.2)

## 2024-01-26 LAB — URIC ACID: Uric Acid, Serum: 3.5 mg/dL — ABNORMAL LOW (ref 3.7–8.6)

## 2024-01-26 LAB — SAVE SMEAR(SSMR), FOR PROVIDER SLIDE REVIEW

## 2024-01-26 LAB — LACTATE DEHYDROGENASE: LDH: 126 U/L (ref 98–192)

## 2024-01-26 MED ORDER — VENETOCLAX 10 & 50 & 100 MG PO TBPK
ORAL_TABLET | ORAL | 0 refills | Status: DC
  Filled 2024-01-27: qty 42, 28d supply, fill #0

## 2024-01-26 MED ORDER — ACETAMINOPHEN 325 MG PO TABS
650.0000 mg | ORAL_TABLET | Freq: Once | ORAL | Status: AC
  Administered 2024-01-26: 650 mg via ORAL
  Filled 2024-01-26: qty 2

## 2024-01-26 MED ORDER — SODIUM CHLORIDE 0.9 % IV SOLN
1000.0000 mg | Freq: Once | INTRAVENOUS | Status: AC
  Administered 2024-01-26: 1000 mg via INTRAVENOUS
  Filled 2024-01-26: qty 40

## 2024-01-26 MED ORDER — DIPHENHYDRAMINE HCL 50 MG/ML IJ SOLN
25.0000 mg | Freq: Once | INTRAMUSCULAR | Status: AC
  Administered 2024-01-26: 25 mg via INTRAVENOUS
  Filled 2024-01-26: qty 1

## 2024-01-26 MED ORDER — VENETOCLAX 100 MG PO TABS: 200.0000 mg | ORAL_TABLET | Freq: Every day | ORAL | 12 refills | Status: DC

## 2024-01-26 MED ORDER — SODIUM CHLORIDE 0.9 % IV SOLN: INTRAVENOUS | Status: DC

## 2024-01-26 MED ORDER — SODIUM CHLORIDE 0.9 % IV SOLN
20.0000 mg | Freq: Once | INTRAVENOUS | Status: AC
  Administered 2024-01-26: 20 mg via INTRAVENOUS
  Filled 2024-01-26: qty 20

## 2024-01-26 NOTE — Telephone Encounter (Signed)
 Oral Oncology Patient Advocate Encounter   Received notification that prior authorization for VENCLEXTA is required.   PA submitted on 01/26/24 Key [id number] Status is pending      Charlott Hamilton,  CPhT-Adv  she/her/hers [facility]  [facility] Specialty Pharmacy Services Pharmacy Technician Patient Advocate Specialist III [facility] Phone: [phone]  Fax: [phone] [email or web address]

## 2024-01-26 NOTE — Progress Notes (Signed)
 OK to release Gazyva  prior to [facility] resulted per order of Dr. Timmy.

## 2024-01-26 NOTE — Telephone Encounter (Addendum)
 Oral Oncology Pharmacist Encounter  Received new prescription for Venclexta (venetoclax) for the treatment of CLL in conjunction with zanubrutinib  (started 07/13/23) and obinutuzumab  (last cycle of obinutuzumab  to be received 01/26/24), planned duration of venetoclax is 1 year.  CBC w/ Diff and CMP from 01/26/24 assessed, patient WBC 18.4 K/uL (trending down). LDH stable at 126 U/L. Patient will have uric acid obtained in clinic on 01/26/24. Message sent to MD regarding changing venetoclax prescription to starter pack for dose-escalation. OK per Dr. Timmy to start patient on dose-escalation - prescription reordered and sent to pharmacy. Prescription dose and frequency assessed for appropriateness.  Baseline uric acid 3.5 mg/dL - per Dr. Timmy via staff message on 01/26/24, he does not want patient on allopurinol  for TLS PPX at this time.   Current medication list in Epic reviewed, no relevant/significant DDIs with Venclexta identified.  Evaluated chart and no patient barriers to medication adherence noted.   Prescription has been e-scribed to the [facility] for benefits analysis and approval.  Oral Oncology Clinic will continue to follow for insurance authorization, copayment issues, initial counseling and start date.  George [name], PharmD, BCPS, BCOP Hematology/Oncology Clinical Pharmacist [phone] 01/26/2024 9:23 AM

## 2024-01-26 NOTE — Progress Notes (Signed)
 BP remains elevated, 158/92. Instructed to monitor at home and notify PCP if it remains over 140/90, verbalized understanding.

## 2024-01-26 NOTE — Progress Notes (Signed)
 Hematology and Oncology Follow Up Visit  George [name] [id number] 1951-02-18 73 y.o. 01/26/2024   Principle Diagnosis:  CLL-stage C   Current Therapy:        Gazyva /Brukinsa /venetoclax -- s/p cycle #6 - start on 07/14/2023,         Start venetoclax  200 mg p.o. daily on 01/28/2024                     Interim History:  George [name] is here today for follow-up and treatment.  This will be his last Gazyva  treatment.  He is on so well.  His white cell count is coming out quite nicely.SABRA  He did have a CT scan.  This was done on 01/18/2024.  Thankfully, the CT scan showed resolution of his adenopathy.  So happy that he has responded so well.  Now, we will start the venetoclax.  We will go ahead and get that started on him.  I will would like to get him on 200 mg a day.  He has had no problems with bowels or bladder.  He has had no nausea or vomiting.  He has had a little bit of a cough.  He says this is from believes that he has to get up and all the dust that they create.  He has had no bleeding.  He has had no leg swelling.  Overall, I will say that his performance status is probably ECOG 1.    Medications:  Allergies as of 01/26/2024       Reactions   Doxycycline  Other (See Comments)   Sore throat, red tongue, red skin Patient states that he was also on a camping trip when the symptoms developed, so he is unsure if they were due to the doxycycline  or something that he encountered the mountains.        Medication List        Accurate as of January 26, 2024  8:04 AM. If you have any questions, ask your nurse or doctor.          Brukinsa  80 MG capsule Generic drug: zanubrutinib  Take 2 capsules (160 mg total) by mouth 2 (two) times daily.   cholecalciferol 1000 units tablet Commonly known as: VITAMIN D  Take 1,000 Units by mouth daily. 2 tablets daily   CO Q 10 PO Take by mouth every morning.   esomeprazole  20 MG capsule Commonly known as: NEXIUM  Take 1 capsule (20 mg  total) by mouth daily.   famciclovir  500 MG tablet Commonly known as: FAMVIR  Take 1 tablet (500 mg total) by mouth daily.   fluorouracil  5 % cream Commonly known as: EFUDEX  Apply topically once daily as directed.   GLUCOSAMINE HCL PO Take by mouth every morning.   losartan  50 MG tablet Commonly known as: COZAAR  Take 1 tablet (50 mg total) by mouth daily.   OVER THE COUNTER MEDICATION 2 (two) times daily. CLEAR- One spray BID.   rosuvastatin  40 MG tablet Commonly known as: CRESTOR  Take 1 tablet (40 mg total) by mouth daily.   UNABLE TO FIND SPF 30+ CREAM/LOTION        Allergies:  Allergies  Allergen Reactions   Doxycycline  Other (See Comments)    Sore throat, red tongue, red skin  Patient states that he was also on a camping trip when the symptoms developed, so he is unsure if they were due to the doxycycline  or something that he encountered the mountains.    Past Medical History,  Surgical history, Social history, and Family History were reviewed and updated.  Review of Systems: Review of Systems  Constitutional: Negative.   HENT: Negative.    Eyes: Negative.   Respiratory: Negative.    Cardiovascular: Negative.   Gastrointestinal: Negative.   Genitourinary: Negative.   Musculoskeletal: Negative.   Skin: Negative.   Neurological: Negative.   Endo/Heme/Allergies: Negative.   Psychiatric/Behavioral: Negative.       Physical Exam:  Vital signs show temperature of 98.  Pulse 81.  Blood pressure 158/92.  Weight is 162 pounds.   Wt Readings from Last 3 Encounters:  01/26/24 162 lb 1.9 oz (73.5 kg)  12/29/23 158 lb (71.7 kg)  12/01/23 160 lb 12.8 oz (72.9 kg)    Physical Exam Vitals reviewed.  HENT:     Head: Normocephalic and atraumatic.  Eyes:     Pupils: Pupils are equal, round, and reactive to light.  Cardiovascular:     Rate and Rhythm: Normal rate and regular rhythm.     Heart sounds: Normal heart sounds.  Pulmonary:     Effort: Pulmonary  effort is normal.     Breath sounds: Normal breath sounds.  Abdominal:     General: Bowel sounds are normal.     Palpations: Abdomen is soft.  Musculoskeletal:        General: No tenderness or deformity. Normal range of motion.     Cervical back: Normal range of motion.  Lymphadenopathy:     Cervical: No cervical adenopathy.  Skin:    General: Skin is warm and dry.     Findings: No erythema or rash.  Neurological:     Mental Status: He is alert and oriented to person, place, and time.  Psychiatric:        Behavior: Behavior normal.        Thought Content: Thought content normal.        Judgment: Judgment normal.      Lab Results  Component Value Date   WBC 18.4 (H) 01/26/2024   HGB 12.4 (L) 01/26/2024   HCT 37.3 (L) 01/26/2024   MCV 90.3 01/26/2024   PLT 232 01/26/2024   No results found for: FERRITIN, IRON, TIBC, UIBC, IRONPCTSAT Lab Results  Component Value Date   RETICCTPCT 1.3 03/02/2023   RBC 4.13 (L) 01/26/2024   Lab Results  Component Value Date   KPAFRELGTCHN 45.6 (H) 06/15/2023   LAMBDASER 7.5 06/15/2023   KAPLAMBRATIO 6.08 (H) 06/15/2023   Lab Results  Component Value Date   IGGSERUM 697 06/15/2023   IGA 61 06/15/2023   IGMSERUM 32 06/15/2023   Lab Results  Component Value Date   TOTALPROTELP 6.4 09/19/2021   ALBUMINELP 4.2 09/19/2021   A1GS 0.2 09/19/2021   A2GS 0.6 09/19/2021   BETS 0.8 09/19/2021   BETA2SER 5.0 08/04/2012   GAMS 0.6 09/19/2021   MSPIKE Not Observed 09/19/2021   SPEI * 08/04/2012     Chemistry      Component Value Date/Time   NA 135 12/29/2023 0747   NA 130 (L) 10/02/2023 0951   K 3.8 12/29/2023 0747   CL 99 12/29/2023 0747   CO2 23 12/29/2023 0747   BUN 7 (L) 12/29/2023 0747   BUN 8 10/02/2023 0951   CREATININE 0.81 12/29/2023 0747   CREATININE 0.73 10/15/2015 0909   GLU 77 02/08/2022 0000      Component Value Date/Time   CALCIUM  9.5 12/29/2023 0747   ALKPHOS 77 12/29/2023 0747   AST 49 (H)  12/29/2023  0747   ALT 28 12/29/2023 0747   BILITOT 0.6 12/29/2023 0747       Impression and Plan: George [name] is a very pleasant 73 yo caucasian gentleman with CLL that we had followed for about 17 years.   Again, he is responding.  As expected, his white cell count is coming down.  I cannot palpate any adenopathy on his exam.  The CT scan showed that he had resolution of his adenopathy and splenomegaly.  Now, we will do the venetoclax.  I sent the prescription into the pharmacy.  He will get his last Gazyva  today.  Have him come back in about 6 weeks now.  Hopefully, we will be able to move his appointments out a little bit longer now that he is off the Gazyva .   Maude JONELLE Crease, MD 11/4/20258:04 AM

## 2024-01-26 NOTE — Patient Instructions (Signed)
 [facility] - A DEPT OF [facility]  Discharge Instructions: Thank you for choosing [facility] to provide your oncology and hematology care.   If you have a lab appointment with the Cancer Center, please go directly to the Cancer Center and check in at the registration area.  Wear comfortable clothing and clothing appropriate for easy access to any Portacath or PICC line.   We strive to give you quality time with your provider. You may need to reschedule your appointment if you arrive late (15 or more minutes).  Arriving late affects you and other patients whose appointments are after yours.  Also, if you miss three or more appointments without notifying the office, you may be dismissed from the clinic at the provider's discretion.      For prescription refill requests, have your pharmacy contact our office and allow 72 hours for refills to be completed.    Today you received the following chemotherapy and/or immunotherapy agents Gazyva      To help prevent nausea and vomiting after your treatment, we encourage you to take your nausea medication as directed.  BELOW ARE SYMPTOMS THAT SHOULD BE REPORTED IMMEDIATELY: *FEVER GREATER THAN 100.4 F (38 C) OR HIGHER *CHILLS OR SWEATING *NAUSEA AND VOMITING THAT IS NOT CONTROLLED WITH YOUR NAUSEA MEDICATION *UNUSUAL SHORTNESS OF BREATH *UNUSUAL BRUISING OR BLEEDING *URINARY PROBLEMS (pain or burning when urinating, or frequent urination) *BOWEL PROBLEMS (unusual diarrhea, constipation, pain near the anus) TENDERNESS IN MOUTH AND THROAT WITH OR WITHOUT PRESENCE OF ULCERS (sore throat, sores in mouth, or a toothache) UNUSUAL RASH, SWELLING OR PAIN  UNUSUAL VAGINAL DISCHARGE OR ITCHING   Items with * indicate a potential emergency and should be followed up as soon as possible or go to the Emergency Department if any problems should occur.  Please show the CHEMOTHERAPY ALERT CARD or IMMUNOTHERAPY ALERT  CARD at check-in to the Emergency Department and triage nurse. Should you have questions after your visit or need to cancel or reschedule your appointment, please contact [facility] - A DEPT OF [facility]  [phone] and follow the prompts.  Office hours are 8:00 a.m. to 4:30 p.m. Monday - Friday. Please note that voicemails left after 4:00 p.m. may not be returned until the following business day.  We are closed weekends and major holidays. You have access to a nurse at all times for urgent questions. Please call the main number to the clinic [phone] and follow the prompts.  For any non-urgent questions, you may also contact your provider using MyChart. We now offer e-Visits for anyone 75 and older to request care online for non-urgent symptoms. For details visit [email or web address].   Also download the MyChart app! Go to the app store, search "MyChart", open the app, select , and log in with your MyChart username and password.

## 2024-01-26 NOTE — Telephone Encounter (Signed)
 Oral Oncology Patient Advocate Encounter  Prior Authorization for [employer] has been approved.    PA# [id number] Effective dates: 01-26-2024 through 01/25/2025  Patients co-pay is $0.00.     Charlott Hamilton,  CPhT-Adv  she/her/hers [facility]  [facility] Specialty Pharmacy Services Pharmacy Technician Patient Advocate Specialist III [facility] Phone: [phone]  Fax: [phone] [email or web address]

## 2024-01-27 ENCOUNTER — Other Ambulatory Visit (HOSPITAL_COMMUNITY): Payer: Self-pay

## 2024-01-27 ENCOUNTER — Other Ambulatory Visit: Payer: Self-pay

## 2024-01-27 NOTE — Progress Notes (Signed)
 Specialty Pharmacy Initial Fill Coordination Note  George [name] is a 73 y.o. male contacted today regarding refills of specialty medication(s) Venetoclax (VENCLEXTA) .  Patient requested Delivery  on 02/01/24  to verified address [address] [place] [address]   Medication will be filled on 01/29/24.   Patient is aware of $0.00 copayment.

## 2024-01-27 NOTE — Progress Notes (Signed)
 Oral Chemotherapy Pharmacist Encounter  Patient was counseled under telephone encounter from 01/26/24.  Asberry Macintosh, PharmD, BCPS, BCOP Hematology/Oncology Clinical Pharmacist [facility] Oral Chemotherapy Navigation Clinics [phone] 01/27/2024 1:27 PM

## 2024-01-27 NOTE — Telephone Encounter (Signed)
 Oral Chemotherapy Pharmacist Encounter  I spoke with patient for overview of: Venclexta (venetoclax) for the treatment of CLL in conjunction with zanubrutinib  (started 07/13/23) and obinutuzumab  (last cycle of obinutuzumab  to be received 01/26/24), planned duration of venetoclax is 1 year.   Treatment goal: Curative  Counseled patient on administration, dosing, side effects, monitoring, drug-food interactions, safe handling, storage, and disposal.  Venclexta will be administered outpatient based on tumor lysis syndrome (TLS) risk assessment to be low risk. Patient aware of importance to increase hydration while on Venclexta. Labs during the initial 4-week ramp up phase will be at MD discretion.    Week 1: Patient will take Venclexta 10mg  tablets, 2 tablets (20 mg total) by mouth once daily with food and water for 7 days. Week 2: Patient will take Venclexta 50mg  tablets, 1 tablet (50 mg total) by mouth once daily with food and water for 7 days. Week 3: Patient will take Venclexta 100 mg tablets, 1 tablet (100 mg total) by mouth once daily with food and water for 7 days. Week 4: Patient will take Venclexta 100 mg tablets, 2 tablets (200 mg total) by mouth once daily with food and water for 7 days.  Subsequent ramp-up doses to 400mg  daily (target maintenance dose) will be monitored per MD.   Patient knows to avoid grapefruit, grapefruit juice, seville oranges and star fruit while on Venclexta.  Venclexta start date: 02/02/24  Adverse effects include but are not limited to: TLS, decreased blood counts, electrolyte abnormalities, diarrhea, nausea, fatigue, arthralgias/myalgias TLS PPX: Per Dr. Timmy, patient will not be on allopurinol  based on patient's uric acid on 01/26/24 of 3.5 mg/dL. Diarrhea: Patient will obtain anti diarrheal and alert the office of 4 or more loose stools above baseline.  Reviewed with patient importance of keeping a medication schedule and plan for any missed doses. No  barriers to medication adherence identified.  Medication reconciliation performed and medication/allergy list updated.  Distress thermometer flowsheet: Distress thermometer not completed during telephone call as patient has been on previous lines of therapy.   Communication and Learning Assessment Primary learner: Patient Barriers to learning: No barriers Preferred language: English Learning preferences: Listening Reading  All questions answered.  Mr. Christopher voiced understanding and appreciation.   Medication education handout and medication calendar placed in mail for patient. Patient knows to call the office with questions or concerns. Oral Chemotherapy Clinic phone number provided to patient.   Asberry Macintosh, PharmD, BCPS, BCOP Hematology/Oncology Clinical Pharmacist [phone] 01/27/2024 1:12 PM

## 2024-01-28 ENCOUNTER — Other Ambulatory Visit: Payer: Self-pay

## 2024-01-29 ENCOUNTER — Other Ambulatory Visit: Payer: Self-pay

## 2024-01-29 ENCOUNTER — Other Ambulatory Visit (HOSPITAL_COMMUNITY): Payer: Self-pay

## 2024-01-29 ENCOUNTER — Telehealth: Payer: Self-pay | Admitting: Pharmacist

## 2024-01-29 DIAGNOSIS — C9110 Chronic lymphocytic leukemia of B-cell type not having achieved remission: Secondary | ICD-10-CM

## 2024-01-29 MED ORDER — ZANUBRUTINIB 160 MG PO TABS
160.0000 mg | ORAL_TABLET | Freq: Two times a day (BID) | ORAL | 4 refills | Status: AC
  Filled 2024-01-29 (×2): qty 60, 30d supply, fill #0
  Filled 2024-02-19 – 2024-02-22 (×2): qty 60, 30d supply, fill #1
  Filled 2024-03-25 – 2024-03-28 (×2): qty 60, 30d supply, fill #2
  Filled 2024-04-22: qty 60, 30d supply, fill #3

## 2024-01-29 NOTE — Progress Notes (Signed)
 Specialty Pharmacy Refill Coordination Note  George [name] is a 73 y.o. male contacted today regarding refills of specialty medication(s) Zanubrutinib  (BRUKINSA )   Patient requested Delivery   Delivery date: 02/01/24   Verified address: [address] [place]  [address]   Medication will be filled on: 01/29/24  Counseled patient on switch to tablets and new directions.

## 2024-01-29 NOTE — Telephone Encounter (Signed)
 Oral Chemotherapy Pharmacist Encounter   Dosage formulation change required for Brukinsa  (zanubrutinib ). Manufacturer is phasing out previous dosage forms of capsules and changing to tablet formulation. All future prescriptions sent will need to be tablet formulation due to inability to obtain capsules.    Patient's most recent prescription for Brukinsa  has been switched to tablet formulation to be dispensed form the [facility].   Asberry Macintosh, PharmD, BCPS, BCOP Hematology/Oncology Clinical Pharmacist [phone] 01/29/2024 9:51 AM

## 2024-02-01 ENCOUNTER — Other Ambulatory Visit: Payer: Self-pay

## 2024-02-03 ENCOUNTER — Other Ambulatory Visit (HOSPITAL_COMMUNITY): Payer: Self-pay

## 2024-02-03 ENCOUNTER — Other Ambulatory Visit: Payer: Self-pay

## 2024-02-04 ENCOUNTER — Other Ambulatory Visit (HOSPITAL_COMMUNITY): Payer: Self-pay

## 2024-02-12 ENCOUNTER — Other Ambulatory Visit: Payer: Self-pay

## 2024-02-19 ENCOUNTER — Other Ambulatory Visit: Payer: Self-pay | Admitting: Hematology & Oncology

## 2024-02-19 ENCOUNTER — Other Ambulatory Visit (HOSPITAL_COMMUNITY): Payer: Self-pay

## 2024-02-19 DIAGNOSIS — C9110 Chronic lymphocytic leukemia of B-cell type not having achieved remission: Secondary | ICD-10-CM

## 2024-02-22 ENCOUNTER — Other Ambulatory Visit (HOSPITAL_COMMUNITY): Payer: Self-pay

## 2024-02-22 ENCOUNTER — Other Ambulatory Visit: Payer: Self-pay

## 2024-02-22 ENCOUNTER — Other Ambulatory Visit: Payer: Self-pay | Admitting: *Deleted

## 2024-02-22 ENCOUNTER — Other Ambulatory Visit: Payer: Self-pay | Admitting: Pharmacist

## 2024-02-22 DIAGNOSIS — C9110 Chronic lymphocytic leukemia of B-cell type not having achieved remission: Secondary | ICD-10-CM

## 2024-02-22 MED ORDER — VENETOCLAX 100 MG PO TABS
200.0000 mg | ORAL_TABLET | Freq: Every day | ORAL | 3 refills | Status: AC
  Filled 2024-02-22: qty 120, 60d supply, fill #0
  Filled 2024-02-22: qty 56, 28d supply, fill #0
  Filled 2024-03-25 – 2024-03-30 (×2): qty 56, 28d supply, fill #1
  Filled 2024-04-22: qty 56, 28d supply, fill #2

## 2024-02-22 MED ORDER — VENCLEXTA STARTING PACK 10 & 50 & 100 MG PO TBPK: ORAL_TABLET | ORAL | 0 refills | Status: DC

## 2024-02-22 NOTE — Progress Notes (Signed)
 Oral Oncology Pharmacist Encounter  Patient's medication list has been updated to remove venetoclax  starter pack as patient has completed this.   Asberry Macintosh, PharmD, BCPS, BCOP Hematology/Oncology Clinical Pharmacist [phone] 02/22/2024 10:06 AM

## 2024-02-23 ENCOUNTER — Other Ambulatory Visit: Payer: Self-pay

## 2024-02-23 NOTE — Progress Notes (Signed)
 Specialty Pharmacy Ongoing Clinical Assessment Note  George [name] is a 74 y.o. male who is being followed by the specialty pharmacy service for RxSp Oncology   Patient's specialty medication(s) reviewed today: Venetoclax  (VENCLEXTA ); Zanubrutinib  (BRUKINSA )   Missed doses in the last 4 weeks: 0   Patient/Caregiver did not have any additional questions or concerns.   Therapeutic benefit summary: Unable to assess   Adverse events/side effects summary: Experienced adverse events/side effects (occasional diarrhea, tolerable, will use Imodium  if needed)   Patient's therapy is appropriate to: Continue    Goals Addressed             This Visit's Progress    Slow Disease Progression   No change    Patient is initiating therapy. Patient will maintain adherence         Follow up: 3 months  Silvano [name] Specialty Pharmacist

## 2024-02-23 NOTE — Progress Notes (Signed)
 Specialty Pharmacy Refill Coordination Note  George [name] is a 73 y.o. male contacted today regarding refills of specialty medication(s) Venetoclax  (VENCLEXTA ); Zanubrutinib  (BRUKINSA )   Patient requested Delivery   Delivery date: 03/03/24   Verified address: [address] [place] [address]   Medication will be filled on: 03/02/24

## 2024-03-02 ENCOUNTER — Other Ambulatory Visit: Payer: Self-pay

## 2024-03-02 ENCOUNTER — Other Ambulatory Visit (HOSPITAL_COMMUNITY): Payer: Self-pay

## 2024-03-03 ENCOUNTER — Inpatient Hospital Stay (HOSPITAL_BASED_OUTPATIENT_CLINIC_OR_DEPARTMENT_OTHER): Admitting: Hematology & Oncology

## 2024-03-03 ENCOUNTER — Other Ambulatory Visit: Payer: Self-pay

## 2024-03-03 ENCOUNTER — Inpatient Hospital Stay: Attending: Hematology & Oncology

## 2024-03-03 ENCOUNTER — Encounter: Payer: Self-pay | Admitting: Hematology & Oncology

## 2024-03-03 VITALS — BP 125/80 | HR 91 | Temp 98.2°F | Resp 20 | Ht 68.0 in | Wt 157.8 lb

## 2024-03-03 DIAGNOSIS — Z79899 Other long term (current) drug therapy: Secondary | ICD-10-CM | POA: Diagnosis not present

## 2024-03-03 DIAGNOSIS — C9110 Chronic lymphocytic leukemia of B-cell type not having achieved remission: Secondary | ICD-10-CM | POA: Diagnosis present

## 2024-03-03 LAB — CMP (CANCER CENTER ONLY)
ALT: 22 U/L (ref 0–44)
AST: 46 U/L — ABNORMAL HIGH (ref 15–41)
Albumin: 4.5 g/dL (ref 3.5–5.0)
Alkaline Phosphatase: 74 U/L (ref 38–126)
Anion gap: 11 (ref 5–15)
BUN: 7 mg/dL — ABNORMAL LOW (ref 8–23)
CO2: 25 mmol/L (ref 22–32)
Calcium: 9.3 mg/dL (ref 8.9–10.3)
Chloride: 97 mmol/L — ABNORMAL LOW (ref 98–111)
Creatinine: 0.73 mg/dL (ref 0.61–1.24)
GFR, Estimated: >60 mL/min (ref >60)
Glucose, Bld: 89 mg/dL (ref 70–99)
Potassium: 4.1 mmol/L (ref 3.5–5.1)
Sodium: 133 mmol/L — ABNORMAL LOW (ref 135–145)
Total Bilirubin: 1.3 mg/dL — ABNORMAL HIGH (ref 0.0–1.2)
Total Protein: 6.5 g/dL (ref 6.5–8.1)

## 2024-03-03 LAB — CBC WITH DIFFERENTIAL (CANCER CENTER ONLY)
Abs Immature Granulocytes: 0.03 K/uL (ref 0.00–0.07)
Basophils Absolute: 0 K/uL (ref 0.0–0.1)
Basophils Relative: 0 %
Eosinophils Absolute: 0 K/uL (ref 0.0–0.5)
Eosinophils Relative: 0 %
HCT: 36.3 % — ABNORMAL LOW (ref 39.0–52.0)
Hemoglobin: 12.5 g/dL — ABNORMAL LOW (ref 13.0–17.0)
Immature Granulocytes: 0 %
Lymphocytes Relative: 34 %
Lymphs Abs: 3.6 K/uL (ref 0.7–4.0)
MCH: 29.9 pg (ref 26.0–34.0)
MCHC: 34.4 g/dL (ref 30.0–36.0)
MCV: 86.8 fL (ref 80.0–100.0)
Monocytes Absolute: 1.2 K/uL — ABNORMAL HIGH (ref 0.1–1.0)
Monocytes Relative: 11 %
Neutro Abs: 6 K/uL (ref 1.7–7.7)
Neutrophils Relative %: 55 %
Platelet Count: 191 K/uL (ref 150–400)
RBC: 4.18 MIL/uL — ABNORMAL LOW (ref 4.22–5.81)
RDW: 15.6 % — ABNORMAL HIGH (ref 11.5–15.5)
WBC Count: 10.9 K/uL — ABNORMAL HIGH (ref 4.0–10.5)
nRBC: 0 % (ref 0.0–0.2)

## 2024-03-03 LAB — LACTATE DEHYDROGENASE: LDH: 83 U/L — ABNORMAL LOW (ref 105–235)

## 2024-03-03 LAB — SAVE SMEAR(SSMR), FOR PROVIDER SLIDE REVIEW

## 2024-03-03 NOTE — Progress Notes (Signed)
 Hematology and Oncology Follow Up Visit  CAILEN MIHALIK [id number] 12-09-1950 73 y.o. 03/03/2024   Principle Diagnosis:  CLL-stage C   Current Therapy:        Gazyva /Brukinsa /venetoclax  -- s/p cycle #8 - start on 07/14/2023 -completed on 01/26/2024      Start venetoclax   200 mg po q day - start on 01/2024                     Interim History:  Mr. Shipes is here today for follow-up.  He is doing great.  He is now on venetoclax .  He has a little bit of diarrhea with the venetoclax .  He says this does get better the longer he is on each step up dose.  He now is on full dose from my point of view.  He also continues on the Brukinsa .  He has had no problems with the Brukinsa .  He has had no problems with nausea or vomiting.  Has had no change in bowel or blood habits, outside of the loose stools.  There is been no leg swelling.  He has not noted any swollen lymph nodes.  He has had no bleeding.  There is been no headache.  Overall, I will have to say that his performance status is probably ECOG is 0.     Medications:  Allergies as of 03/03/2024       Reactions   Doxycycline  Other (See Comments)   Sore throat, red tongue, red skin Patient states that he was also on a camping trip when the symptoms developed, so he is unsure if they were due to the doxycycline  or something that he encountered the mountains.        Medication List        Accurate as of March 03, 2024  8:56 AM. If you have any questions, ask your nurse or doctor.          Brukinsa  160 MG tablet Generic drug: zanubrutinib  Take 1 tablet (160 mg total) by mouth 2 (two) times daily.   cholecalciferol 1000 units tablet Commonly known as: VITAMIN D  Take 1,000 Units by mouth daily. 2 tablets daily   CO Q 10 PO Take by mouth every morning.   esomeprazole  20 MG capsule Commonly known as: NEXIUM  Take 1 capsule (20 mg total) by mouth daily.   famciclovir  500 MG tablet Commonly known as: FAMVIR  Take 1  tablet (500 mg total) by mouth daily.   fluorouracil  5 % cream Commonly known as: EFUDEX  Apply topically once daily as directed. What changed:  when to take this reasons to take this additional instructions   GLUCOSAMINE HCL PO Take by mouth every morning.   losartan  50 MG tablet Commonly known as: COZAAR  Take 1 tablet (50 mg total) by mouth daily.   multivitamin tablet Take 1 tablet by mouth daily.   OVER THE COUNTER MEDICATION 2 (two) times daily. CLEAR- One spray BID. What changed:  when to take this reasons to take this additional instructions   rosuvastatin  40 MG tablet Commonly known as: CRESTOR  Take 1 tablet (40 mg total) by mouth daily.   UNABLE TO FIND SPF 30+ CREAM/LOTION   Venclexta  100 MG tablet Generic drug: venetoclax  Take 2 tablets (200 mg total) by mouth daily. Tablets should be swallowed whole with a meal and a full glass of water.        Allergies:  Allergies  Allergen Reactions   Doxycycline  Other (See Comments)    Sore  throat, red tongue, red skin  Patient states that he was also on a camping trip when the symptoms developed, so he is unsure if they were due to the doxycycline  or something that he encountered the mountains.    Past Medical History, Surgical history, Social history, and Family History were reviewed and updated.  Review of Systems: Review of Systems  Constitutional: Negative.   HENT: Negative.    Eyes: Negative.   Respiratory: Negative.    Cardiovascular: Negative.   Gastrointestinal: Negative.   Genitourinary: Negative.   Musculoskeletal: Negative.   Skin: Negative.   Neurological: Negative.   Endo/Heme/Allergies: Negative.   Psychiatric/Behavioral: Negative.       Physical Exam:  Vital signs show temperature of 98.2.  Pulse 91.  Blood pressure 125/80.  Weight is 157 pounds.    Wt Readings from Last 3 Encounters:  03/03/24 157 lb 12.8 oz (71.6 kg)  01/26/24 162 lb 1.9 oz (73.5 kg)  12/29/23 158 lb (71.7  kg)    Physical Exam Vitals reviewed.  HENT:     Head: Normocephalic and atraumatic.  Eyes:     Pupils: Pupils are equal, round, and reactive to light.  Cardiovascular:     Rate and Rhythm: Normal rate and regular rhythm.     Heart sounds: Normal heart sounds.  Pulmonary:     Effort: Pulmonary effort is normal.     Breath sounds: Normal breath sounds.  Abdominal:     General: Bowel sounds are normal.     Palpations: Abdomen is soft.  Musculoskeletal:        General: No tenderness or deformity. Normal range of motion.     Cervical back: Normal range of motion.  Lymphadenopathy:     Cervical: No cervical adenopathy.  Skin:    General: Skin is warm and dry.     Findings: No erythema or rash.  Neurological:     Mental Status: He is alert and oriented to person, place, and time.  Psychiatric:        Behavior: Behavior normal.        Thought Content: Thought content normal.        Judgment: Judgment normal.      Lab Results  Component Value Date   WBC 10.9 (H) 03/03/2024   HGB 12.5 (L) 03/03/2024   HCT 36.3 (L) 03/03/2024   MCV 86.8 03/03/2024   PLT 191 03/03/2024   No results found for: FERRITIN, IRON, TIBC, UIBC, IRONPCTSAT Lab Results  Component Value Date   RETICCTPCT 1.3 03/02/2023   RBC 4.18 (L) 03/03/2024   Lab Results  Component Value Date   KPAFRELGTCHN 45.6 (H) 06/15/2023   LAMBDASER 7.5 06/15/2023   KAPLAMBRATIO 6.08 (H) 06/15/2023   Lab Results  Component Value Date   IGGSERUM 697 06/15/2023   IGA 61 06/15/2023   IGMSERUM 32 06/15/2023   Lab Results  Component Value Date   TOTALPROTELP 6.4 09/19/2021   ALBUMINELP 4.2 09/19/2021   A1GS 0.2 09/19/2021   A2GS 0.6 09/19/2021   BETS 0.8 09/19/2021   BETA2SER 5.0 08/04/2012   GAMS 0.6 09/19/2021   MSPIKE Not Observed 09/19/2021   SPEI * 08/04/2012     Chemistry      Component Value Date/Time   NA 130 (L) 01/26/2024 0738   NA 130 (L) 10/02/2023 0951   K 4.7 01/26/2024 0738   CL  94 (L) 01/26/2024 0738   CO2 24 01/26/2024 0738   BUN 8 01/26/2024 0738   BUN 8  10/02/2023 0951   CREATININE 0.70 01/26/2024 0738   CREATININE 0.73 10/15/2015 0909   GLU 77 02/08/2022 0000      Component Value Date/Time   CALCIUM  9.0 01/26/2024 0738   ALKPHOS 78 01/26/2024 0738   AST 46 (H) 01/26/2024 0738   ALT 24 01/26/2024 0738   BILITOT 0.6 01/26/2024 0738       Impression and Plan: Mr. Skog is a very pleasant 73 yo caucasian gentleman with CLL that we had followed for about 17 years.   I am not surprised as how well his blood counts look.  I think the venetoclax  clearly is helping.  Again, we are not going to increase his dose any further.  I forgot to mention that he does take salt tablets.  He buys these over-the-counter.  I do not see probably him on this.  I do not think he needs a bone marrow biopsy prior to the Spring time.  I think we can probably get him back in 2 months.  Will try to get him through most of the Winter.   Maude JONELLE Crease, MD 12/11/20258:56 AM

## 2024-03-04 ENCOUNTER — Other Ambulatory Visit: Payer: Self-pay

## 2024-03-18 ENCOUNTER — Other Ambulatory Visit: Payer: Self-pay

## 2024-03-22 ENCOUNTER — Other Ambulatory Visit (HOSPITAL_COMMUNITY): Payer: Self-pay

## 2024-03-25 ENCOUNTER — Other Ambulatory Visit (HOSPITAL_COMMUNITY): Payer: Self-pay

## 2024-03-25 ENCOUNTER — Other Ambulatory Visit: Payer: Self-pay

## 2024-03-25 ENCOUNTER — Telehealth: Payer: Self-pay

## 2024-03-25 ENCOUNTER — Encounter: Payer: Self-pay | Admitting: Hematology & Oncology

## 2024-03-25 NOTE — Telephone Encounter (Signed)
 Oral Oncology Patient Advocate Encounter  Was successful in securing patient a $8000 grant from [facility] to provide copayment coverage for Brukinsa  and Venclexta .  This will keep the out of pocket expense at $0.     [employer] ID: [id number]  Pending: Diagnosis Verification Approval The billing information is as follows and has been shared with [facility].    RxBin: [id number] PCN: [id number] Member ID: [id number] Group ID: [id number] Dates of Eligibility: 02/24/2024 through 02/22/2025  Fund:  Chronic Lymphocytic Leukemia   Charlott Hamilton,  CPhT-Adv  she/her/hers [facility]  [facility] Pharmacy Technician Patient Advocate Specialist III [facility] Phone: [phone]  Fax: [phone] [email or web address]

## 2024-03-27 ENCOUNTER — Other Ambulatory Visit: Payer: Self-pay

## 2024-03-28 ENCOUNTER — Other Ambulatory Visit (HOSPITAL_COMMUNITY): Payer: Self-pay

## 2024-03-29 ENCOUNTER — Other Ambulatory Visit: Payer: Self-pay

## 2024-03-30 ENCOUNTER — Other Ambulatory Visit: Payer: Self-pay

## 2024-03-30 NOTE — Progress Notes (Signed)
 Specialty Pharmacy Refill Coordination Note  George [name] is a 74 y.o. male contacted today regarding refills of specialty medication(s) Venetoclax  (VENCLEXTA ); Zanubrutinib  (BRUKINSA )   Patient requested Delivery   Delivery date: 04/06/24   Verified address: [address] [place] [address]   Medication will be filled on: 04/05/24

## 2024-03-31 ENCOUNTER — Other Ambulatory Visit: Payer: Self-pay

## 2024-03-31 NOTE — Progress Notes (Signed)
 Patient contacted the pharmacy and is requesting an earlier delivery date for specialty medication(s) Venetoclax  (VENCLEXTA ); Zanubrutinib  (BRUKINSA ) .   Delivery date: 04/01/24   Verified address: [address] [place] [address]     Medication will be filled on: 03/31/24

## 2024-04-04 ENCOUNTER — Other Ambulatory Visit: Payer: Self-pay

## 2024-04-04 ENCOUNTER — Other Ambulatory Visit (HOSPITAL_COMMUNITY): Payer: Self-pay

## 2024-04-04 ENCOUNTER — Encounter: Payer: Self-pay | Admitting: Hematology & Oncology

## 2024-04-04 ENCOUNTER — Other Ambulatory Visit: Payer: Self-pay | Admitting: Hematology & Oncology

## 2024-04-04 MED ORDER — FAMCICLOVIR 500 MG PO TABS
500.0000 mg | ORAL_TABLET | Freq: Every day | ORAL | 8 refills | Status: AC
  Filled 2024-04-04: qty 27, 27d supply, fill #0
  Filled 2024-04-04: qty 3, 3d supply, fill #0

## 2024-04-05 ENCOUNTER — Other Ambulatory Visit (HOSPITAL_COMMUNITY): Payer: Self-pay

## 2024-04-05 ENCOUNTER — Other Ambulatory Visit: Payer: Self-pay

## 2024-04-11 ENCOUNTER — Ambulatory Visit

## 2024-04-12 ENCOUNTER — Other Ambulatory Visit (HOSPITAL_COMMUNITY): Payer: Self-pay

## 2024-04-13 ENCOUNTER — Other Ambulatory Visit: Payer: Self-pay

## 2024-04-13 ENCOUNTER — Other Ambulatory Visit (HOSPITAL_COMMUNITY): Payer: Self-pay

## 2024-04-13 DIAGNOSIS — E782 Mixed hyperlipidemia: Secondary | ICD-10-CM

## 2024-04-13 MED ORDER — ROSUVASTATIN CALCIUM 40 MG PO TABS
40.0000 mg | ORAL_TABLET | Freq: Every day | ORAL | 1 refills | Status: AC
  Filled 2024-04-13: qty 90, 90d supply, fill #0

## 2024-04-14 ENCOUNTER — Other Ambulatory Visit (HOSPITAL_COMMUNITY): Payer: Self-pay

## 2024-04-21 ENCOUNTER — Other Ambulatory Visit (HOSPITAL_COMMUNITY): Payer: Self-pay

## 2024-04-22 ENCOUNTER — Other Ambulatory Visit: Payer: Self-pay

## 2024-04-22 ENCOUNTER — Telehealth: Payer: Self-pay | Admitting: Radiation Oncology

## 2024-04-22 NOTE — Telephone Encounter (Signed)
 Left message for patient to call back to schedule consult per 1/19 referral.

## 2024-04-26 ENCOUNTER — Inpatient Hospital Stay
Admission: RE | Admit: 2024-04-26 | Discharge: 2024-04-26 | Disposition: A | Payer: Self-pay | Source: Ambulatory Visit | Attending: Radiation Oncology | Admitting: Radiation Oncology

## 2024-04-26 ENCOUNTER — Other Ambulatory Visit (HOSPITAL_COMMUNITY): Payer: Self-pay

## 2024-04-26 ENCOUNTER — Other Ambulatory Visit: Payer: Self-pay

## 2024-04-26 DIAGNOSIS — C4442 Squamous cell carcinoma of skin of scalp and neck: Secondary | ICD-10-CM

## 2024-04-27 ENCOUNTER — Other Ambulatory Visit: Payer: Self-pay | Admitting: *Deleted

## 2024-04-27 NOTE — Progress Notes (Incomplete)
 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 []  []    Swallowing concerns? []  []    PEG? []  []     Referrals Yes No Comments  Social Work? []  []    Dentistry? []  []    Swallowing therapy? []  []    Nutrition? []  []    Med/Onc? []  []     Safety Issues Yes No Comments  Prior radiation? []  []    Pacemaker/ICD? []  []    Possible current pregnancy? []  []    Is the patient on methotrexate? []  []     Tobacco/Marijuana/Snuff/ETOH use: {:18581}  Past/Anticipated interventions by otolaryngology, if any: {:18581}  Past/Anticipated interventions by medical oncology, if any: {:18581}     Current Complaints / other details:  ***

## 2024-04-28 ENCOUNTER — Other Ambulatory Visit: Payer: Self-pay

## 2024-05-04 ENCOUNTER — Inpatient Hospital Stay: Admitting: Hematology & Oncology

## 2024-05-04 ENCOUNTER — Inpatient Hospital Stay: Attending: Hematology & Oncology

## 2024-05-06 ENCOUNTER — Ambulatory Visit

## 2024-05-06 ENCOUNTER — Ambulatory Visit: Admitting: Radiation Oncology

## 2024-05-12 ENCOUNTER — Ambulatory Visit
